# Patient Record
Sex: Female | Born: 1960 | Race: Black or African American | Hispanic: No | Marital: Married | Smoking: Never smoker
Health system: Southern US, Community
[De-identification: ages and names within clinical notes are randomized; demographics above are authoritative.]

## PROBLEM LIST (undated history)

## (undated) ENCOUNTER — Emergency Department (HOSPITAL_BASED_OUTPATIENT_CLINIC_OR_DEPARTMENT_OTHER): Payer: 59

## (undated) DIAGNOSIS — K760 Fatty (change of) liver, not elsewhere classified: Secondary | ICD-10-CM

## (undated) DIAGNOSIS — T8859XA Other complications of anesthesia, initial encounter: Secondary | ICD-10-CM

## (undated) DIAGNOSIS — N63 Unspecified lump in unspecified breast: Secondary | ICD-10-CM

## (undated) DIAGNOSIS — I10 Essential (primary) hypertension: Secondary | ICD-10-CM

## (undated) DIAGNOSIS — T4145XA Adverse effect of unspecified anesthetic, initial encounter: Secondary | ICD-10-CM

## (undated) DIAGNOSIS — K219 Gastro-esophageal reflux disease without esophagitis: Secondary | ICD-10-CM

## (undated) DIAGNOSIS — N6452 Nipple discharge: Secondary | ICD-10-CM

## (undated) DIAGNOSIS — C50919 Malignant neoplasm of unspecified site of unspecified female breast: Secondary | ICD-10-CM

## (undated) DIAGNOSIS — R112 Nausea with vomiting, unspecified: Secondary | ICD-10-CM

## (undated) DIAGNOSIS — M1990 Unspecified osteoarthritis, unspecified site: Secondary | ICD-10-CM

## (undated) DIAGNOSIS — Z9889 Other specified postprocedural states: Secondary | ICD-10-CM

## (undated) DIAGNOSIS — Z923 Personal history of irradiation: Secondary | ICD-10-CM

## (undated) DIAGNOSIS — E7800 Pure hypercholesterolemia, unspecified: Secondary | ICD-10-CM

## (undated) DIAGNOSIS — E119 Type 2 diabetes mellitus without complications: Secondary | ICD-10-CM

## (undated) DIAGNOSIS — Z87442 Personal history of urinary calculi: Secondary | ICD-10-CM

## (undated) HISTORY — PX: TUBAL LIGATION: SHX77

## (undated) HISTORY — PX: ABDOMINAL HYSTERECTOMY: SHX81

## (undated) HISTORY — DX: Gastro-esophageal reflux disease without esophagitis: K21.9

## (undated) HISTORY — DX: Type 2 diabetes mellitus without complications: E11.9

---

## 2000-06-21 ENCOUNTER — Other Ambulatory Visit: Admission: RE | Admit: 2000-06-21 | Discharge: 2000-06-21 | Payer: Self-pay | Admitting: Obstetrics and Gynecology

## 2001-06-27 ENCOUNTER — Encounter (INDEPENDENT_AMBULATORY_CARE_PROVIDER_SITE_OTHER): Payer: Self-pay | Admitting: *Deleted

## 2001-06-27 ENCOUNTER — Ambulatory Visit (HOSPITAL_COMMUNITY): Admission: RE | Admit: 2001-06-27 | Discharge: 2001-06-27 | Payer: Self-pay | Admitting: Gastroenterology

## 2005-06-17 ENCOUNTER — Ambulatory Visit (HOSPITAL_COMMUNITY): Admission: RE | Admit: 2005-06-17 | Discharge: 2005-06-17 | Payer: Self-pay | Admitting: Obstetrics and Gynecology

## 2006-08-09 ENCOUNTER — Ambulatory Visit (HOSPITAL_COMMUNITY): Admission: RE | Admit: 2006-08-09 | Discharge: 2006-08-09 | Payer: Self-pay | Admitting: Obstetrics and Gynecology

## 2008-01-23 ENCOUNTER — Other Ambulatory Visit: Admission: RE | Admit: 2008-01-23 | Discharge: 2008-01-23 | Payer: Self-pay | Admitting: Obstetrics and Gynecology

## 2008-02-10 HISTORY — PX: PARTIAL HYSTERECTOMY: SHX80

## 2008-04-24 ENCOUNTER — Ambulatory Visit (HOSPITAL_COMMUNITY): Admission: RE | Admit: 2008-04-24 | Discharge: 2008-04-24 | Payer: Self-pay | Admitting: Obstetrics & Gynecology

## 2009-05-30 ENCOUNTER — Ambulatory Visit (HOSPITAL_COMMUNITY): Admission: RE | Admit: 2009-05-30 | Discharge: 2009-05-30 | Payer: Self-pay | Admitting: Obstetrics and Gynecology

## 2010-06-27 NOTE — Procedures (Signed)
[place]  Patient:    Kerri Shah, Kerri Shah Visit Number: [id number] MRN: [id number]          Service Type: END Location: ENDO Attending Physician:  Rich Brave Dictated by:   Florencia Reasons, M.D. Proc. Date: 06/27/01 Admit Date:  06/27/2001 Discharge Date: 06/27/2001                             Procedure Report  PROCEDURE:  Colonoscopy with biopsy.  INDICATION:  Follow-up of small adenomatous polyp removed colonoscopically approximately six years ago.  FINDINGS:  Diminutive sigmoid polyp biopsied.  DESCRIPTION OF PROCEDURE:  The nature, purpose, and risks of the procedure were familiar to the patient from prior examinations, and she provided written consent.  Sedation was fentanyl 75 mcg and Versed 7 mg IV prior to and during the course of the procedure without arrhythmias or desaturation.  The Olympus adjustable-tension adult video colonoscope was used for this procedure and was advanced with mild difficulty due to looping and stiffness, ultimately reaching the cecum as identified by clear visualization of the appendiceal orifice and transillumination of the right lower quadrant.  Pullback was then performed.  The quality of the prep was excellent, and it is felt that all areas were well-seen.  There was a diminutive 2 mm sessile polyp in the sigmoid region removed by several cold biopsies.  There was also a pale, hyperplastic-appearing polyp that seemed to essentially flatten out near the rectosigmoid area, and I did not biopsy it.  No other polyps were seen, and there was no evidence of cancer, colitis, vascular malformations, or diverticulosis.  Retroflexion in the rectum was unremarkable, as was reinspection of the rectosigmoid. Pull-out showed mild to moderate internal hemorrhoids.  The patient tolerated the procedure well, and there were no apparent complications.  IMPRESSION: 1. Diminutive sigmoid polyp. 2.  Prior history of adenomatous polyps.  PLAN:  Await pathology on current polyp with colonoscopic follow-up anticipated five years from now in view of the prior history of a colonic adenoma.Dictated by:   Florencia Reasons, M.D. Attending Physician:  Rich Brave DD:  06/27/01 TD:  06/29/01 Job: [id number]

## 2010-10-30 LAB — COMPREHENSIVE METABOLIC PANEL
AST: 18 U/L
BUN: 9 mg/dL (ref 4–21)
Calcium: 9.3 mg/dL
Chloride, Serum: 104
Creat: 0.78
Sodium, serum: 142
Total Protein: 7.1 g/dL

## 2010-12-01 ENCOUNTER — Ambulatory Visit (INDEPENDENT_AMBULATORY_CARE_PROVIDER_SITE_OTHER): Payer: BC Managed Care – PPO | Admitting: Urgent Care

## 2010-12-01 ENCOUNTER — Encounter: Payer: Self-pay | Admitting: Urgent Care

## 2010-12-01 VITALS — BP 120/75 | HR 86 | Temp 97.7°F | Ht 66.0 in | Wt 186.4 lb

## 2010-12-01 DIAGNOSIS — R1011 Right upper quadrant pain: Secondary | ICD-10-CM

## 2010-12-01 DIAGNOSIS — K760 Fatty (change of) liver, not elsewhere classified: Secondary | ICD-10-CM | POA: Insufficient documentation

## 2010-12-01 DIAGNOSIS — K921 Melena: Secondary | ICD-10-CM | POA: Insufficient documentation

## 2010-12-01 DIAGNOSIS — Z800 Family history of malignant neoplasm of digestive organs: Secondary | ICD-10-CM

## 2010-12-01 DIAGNOSIS — K7689 Other specified diseases of liver: Secondary | ICD-10-CM

## 2010-12-01 NOTE — Patient Instructions (Addendum)
Instructions for fatty liver: Recommend 1-2# weight loss per week until ideal body weight through exercise & diet. Low fat/cholesterol diet. Gradually increase exercise from 15 min daily up to 1 hr per day 5 days/week. Limit alcohol use. Call if severe abdominal pain or bleeding prior to procedures Keep your colonoscopy and EGD as planned.

## 2010-12-01 NOTE — Progress Notes (Signed)
Referring Provider: Dr. Sherryll Burger Primary Care Physician:  Kirstie Peri, MD Primary Gastroenterologist:  Dr. Darrick Penna  Chief Complaint  Patient presents with  . Colonoscopy    HPI:  Kerri Shah is a 50 y.o. female here as a referral from Dr. Sherryll Burger for screening colonoscopy.  Upon further questioning by the triage nurse, she had questions about her family history of colon cancer and had recent abdominal pain. She is asked to be brought into the office for further evaluation prior to her procedure. She describes a nagging pain RUQ x few yrs. Intermittent, last mins-hrs.  Not assoc w/ eating. She has tried Omeprazole 20mg  x 1 week-a little help.  Denies fever, chills, nausea, vomiting.  Appetite ok.  +Fatigue.  BM daily or QOD.  C/o small volume bright red blood w/ BM.  Feels like her hemorrhoids are flare.  Wt stable.    She had an ultrasound on 11/24/2010 that showed hepatomegaly with diffuse fatty infiltration of the liver, sludge and polyps noted within the gallbladder, otherwise normal exam. She also had lab work through Dr. Margaretmary Eddy office.  CMP was normal except for elevated glucose (diabetes).  Hemoglobin 12.1, hematocrit 37.1, platelets 273. White blood cell 3.7 (low). TSH was normal.  She has a vitamin D deficiency.  Past Medical History  Diagnosis Date  . DM (diabetes mellitus)   . GERD (gastroesophageal reflux disease)   . Vitamin D deficiency     Past Surgical History  Procedure Date  . Partial hysterectomy 2010    uterine fibroids    Current Outpatient Prescriptions  Medication Sig Dispense Refill  . Multiple Vitamin (MULTIVITAMIN) capsule Take 1 capsule by mouth daily.          Allergies as of 12/01/2010  . (No Known Allergies)    Family History  Problem Relation Age of Onset  . Colon cancer Mother 37  . Colon cancer Brother 79  . Diabetes Brother     History   Social History  . Marital Status: Married    Spouse Name: N/A    Number of Children: 2  . Years of  Education: N/A   Occupational History  . 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 Smoking status: Never Smoker   . Smokeless tobacco: Not on file  . Alcohol Use: No  . Drug Use: No  . Sexually Active: Not on file   Review of Systems: Gen: She has had some fatigue and malaise. Denies any fever chills sweats  CV: Denies chest pain, angina, palpitations, syncope, orthopnea, PND, peripheral edema, and claudication. Resp: Denies dyspnea at rest, dyspnea with exercise, cough, sputum, wheezing, coughing up blood, and pleurisy. GI: Denies vomiting blood, jaundice, and fecal incontinence. GU : Denies urinary burning, blood in urine, urinary frequency, urinary hesitancy, nocturnal urination, and urinary incontinence. MS: Denies joint pain, limitation of movement, and swelling, stiffness, low back pain, extremity pain. Denies muscle weakness, cramps, atrophy.  Derm: Denies rash, itching, dry skin, hives, moles, warts, or unhealing ulcers.  Psych: Denies depression, anxiety, memory loss, suicidal ideation, hallucinations, paranoia, and confusion. Heme: Denies bruising, bleeding, and enlarged lymph nodes.  Physical Exam: BP 120/75  Pulse 86  Temp(Src) 97.7 F (36.5 C) (Temporal)  Ht 5\' 6"  (1.676 m)  Wt 186 lb 6.4 oz (84.55 kg)  BMI 30.09 kg/m2 General:   Alert,  Well-developed, well-nourished, pleasant and cooperative in NAD Head:  Normocephalic and atraumatic. Eyes:  Sclera clear, no icterus.   Conjunctiva pink. Ears:  Normal auditory acuity. Nose:  No deformity, discharge,  or lesions. Mouth:  No deformity or lesions, oropharynx pink and moist. Neck:  Supple; no masses or thyromegaly. Lungs:  Clear throughout to auscultation.   No wheezes, crackles, or rhonchi. No acute distress. Heart:  Regular rate and rhythm; no murmurs, clicks, rubs,  or gallops. Abdomen:  Soft, nontender and nondistended. No masses, hepatosplenomegaly or hernias noted. Normal bowel sounds, without guarding, and  without rebound.   Rectal:  Deferred until time of colonoscopy.   Msk:  Symmetrical without gross deformities. Normal posture. Pulses:  Normal pulses noted. Extremities:  Without clubbing or edema. Neurologic:  Alert and  oriented x4;  grossly normal neurologically. Skin:  Intact without significant lesions or rashes. Cervical Nodes:  No significant cervical adenopathy. Psych:  Alert and cooperative. Normal mood and affect.

## 2010-12-01 NOTE — Assessment & Plan Note (Signed)
In both mother and brother. Will need five-year high-risk surveillance at minimum.

## 2010-12-01 NOTE — Assessment & Plan Note (Signed)
In the setting of normal LFTs. Instructions for fatty liver: Recommend 1-2# weight loss per week until ideal body weight through exercise & diet. Low fat/cholesterol diet. Gradually increase exercise from 15 min daily up to 1 hr per day 5 days/week. Limit alcohol use.

## 2010-12-01 NOTE — Assessment & Plan Note (Signed)
Etiology unclear. Minimal difference with trial of PPI.  Differentials include peptic ulcer disease, gastritis, poorly-controlled GERD, biliary dyskinesia.  EGD with Dr. Darrick Penna in the future. I have discussed risks & benefits which include, but are not limited to, bleeding, infection, perforation & drug reaction.  The patient agrees with this plan & written consent will be obtained.

## 2010-12-01 NOTE — Assessment & Plan Note (Signed)
Kerri Shah is a 50 y.o. female recent set up colonoscopy. She has had small volume hematochezia which I suspect is due to benign anorectal source, however she does need colonoscopy for further evaluation to look for colorectal ca, polyps, diverticular bleeding, especially given her strong family history of colon cancer.  I have discussed risks & benefits which include, but are not limited to, bleeding, infection, perforation & drug reaction.  The patient agrees with this plan & written consent will be obtained.

## 2010-12-02 NOTE — Progress Notes (Signed)
Cc to PCP 

## 2010-12-11 HISTORY — PX: UPPER GASTROINTESTINAL ENDOSCOPY: SHX188

## 2010-12-22 MED ORDER — SODIUM CHLORIDE 0.45 % IV SOLN
Freq: Once | INTRAVENOUS | Status: AC
  Administered 2010-12-23: 11:00:00 via INTRAVENOUS

## 2010-12-23 ENCOUNTER — Ambulatory Visit (HOSPITAL_COMMUNITY)
Admission: RE | Admit: 2010-12-23 | Discharge: 2010-12-23 | Disposition: A | Discharge status: Home / Self Care | Payer: BC Managed Care – PPO | Source: Ambulatory Visit | Attending: Gastroenterology | Admitting: Gastroenterology

## 2010-12-23 ENCOUNTER — Encounter (HOSPITAL_COMMUNITY): Payer: Self-pay | Admitting: *Deleted

## 2010-12-23 ENCOUNTER — Other Ambulatory Visit: Payer: Self-pay | Admitting: Gastroenterology

## 2010-12-23 ENCOUNTER — Encounter (HOSPITAL_COMMUNITY)
Admission: RE | Disposition: A | Discharge status: Home / Self Care | Payer: Self-pay | Source: Ambulatory Visit | Attending: Gastroenterology

## 2010-12-23 DIAGNOSIS — R1011 Right upper quadrant pain: Secondary | ICD-10-CM

## 2010-12-23 DIAGNOSIS — Z800 Family history of malignant neoplasm of digestive organs: Secondary | ICD-10-CM | POA: Insufficient documentation

## 2010-12-23 DIAGNOSIS — K921 Melena: Secondary | ICD-10-CM

## 2010-12-23 DIAGNOSIS — D129 Benign neoplasm of anus and anal canal: Secondary | ICD-10-CM

## 2010-12-23 DIAGNOSIS — K2990 Gastroduodenitis, unspecified, without bleeding: Secondary | ICD-10-CM | POA: Insufficient documentation

## 2010-12-23 DIAGNOSIS — Z09 Encounter for follow-up examination after completed treatment for conditions other than malignant neoplasm: Secondary | ICD-10-CM | POA: Insufficient documentation

## 2010-12-23 DIAGNOSIS — K2970 Gastritis, unspecified, without bleeding: Secondary | ICD-10-CM | POA: Insufficient documentation

## 2010-12-23 DIAGNOSIS — K648 Other hemorrhoids: Secondary | ICD-10-CM

## 2010-12-23 DIAGNOSIS — K3189 Other diseases of stomach and duodenum: Secondary | ICD-10-CM | POA: Insufficient documentation

## 2010-12-23 DIAGNOSIS — D128 Benign neoplasm of rectum: Secondary | ICD-10-CM

## 2010-12-23 DIAGNOSIS — Z860100 Personal history of colon polyps, unspecified: Secondary | ICD-10-CM | POA: Insufficient documentation

## 2010-12-23 DIAGNOSIS — K625 Hemorrhage of anus and rectum: Secondary | ICD-10-CM

## 2010-12-23 DIAGNOSIS — K760 Fatty (change of) liver, not elsewhere classified: Secondary | ICD-10-CM

## 2010-12-23 DIAGNOSIS — D126 Benign neoplasm of colon, unspecified: Secondary | ICD-10-CM | POA: Insufficient documentation

## 2010-12-23 HISTORY — PX: COLONOSCOPY: SHX174

## 2010-12-23 HISTORY — DX: Fatty (change of) liver, not elsewhere classified: K76.0

## 2010-12-23 HISTORY — DX: Pure hypercholesterolemia, unspecified: E78.00

## 2010-12-23 SURGERY — COLONOSCOPY WITH ESOPHAGOGASTRODUODENOSCOPY (EGD)
Anesthesia: Moderate Sedation

## 2010-12-23 MED ORDER — MEPERIDINE HCL 100 MG/ML IJ SOLN
INTRAMUSCULAR | Status: AC
  Filled 2010-12-23: qty 2

## 2010-12-23 MED ORDER — MIDAZOLAM HCL 5 MG/5ML IJ SOLN
INTRAMUSCULAR | Status: AC
  Filled 2010-12-23: qty 10

## 2010-12-23 MED ORDER — OMEPRAZOLE 20 MG PO CPDR: 20.0000 mg | DELAYED_RELEASE_CAPSULE | Freq: Every day | ORAL | Status: DC

## 2010-12-23 MED ORDER — SIMETHICONE 40 MG/0.6ML PO SUSP
ORAL | Status: DC | PRN
  Administered 2010-12-23: 11:00:00

## 2010-12-23 MED ORDER — MEPERIDINE HCL 100 MG/ML IJ SOLN
INTRAMUSCULAR | Status: DC | PRN
  Administered 2010-12-23: 50 mg via INTRAVENOUS
  Administered 2010-12-23: 25 mg via INTRAVENOUS

## 2010-12-23 MED ORDER — MIDAZOLAM HCL 5 MG/5ML IJ SOLN
INTRAMUSCULAR | Status: DC | PRN
  Administered 2010-12-23 (×2): 2 mg via INTRAVENOUS
  Administered 2010-12-23: 1 mg via INTRAVENOUS

## 2010-12-23 MED ORDER — BUTAMBEN-TETRACAINE-BENZOCAINE 2-2-14 % EX AERO
INHALATION_SPRAY | CUTANEOUS | Status: DC | PRN
  Administered 2010-12-23: 2 via TOPICAL

## 2010-12-23 NOTE — H&P (Signed)
Reason for Visit     Colonoscopy        Vitals - Last Recorded       BP Pulse Temp(Src) Ht Wt BMI    120/75  86  97.7 F (36.5 C) (Temporal)  5\' 6"  (1.676 m)  186 lb 6.4 oz (84.55 kg)  30.09 kg/m2       Progress Notes     Lorenza Burton, NP  12/01/2010  4:33 PM  Signed Referring Provider: Dr. Sherryll Burger Primary Care Physician:  Kirstie Peri, MD Primary Gastroenterologist:  Dr. Darrick Penna    Chief Complaint   Patient presents with   .  Colonoscopy      HPI:  Kerri Shah is a 50 y.o. female here as a referral from Dr. Sherryll Burger for screening colonoscopy.  Upon further questioning by the triage nurse, she had questions about her family history of colon cancer and had recent abdominal pain. She is asked to be brought into the office for further evaluation prior to her procedure. She describes a nagging pain RUQ x few yrs. Intermittent, last mins-hrs.  Not assoc w/ eating. She has tried Omeprazole 20mg  x 1 week-a little help.  Denies fever, chills, nausea, vomiting.  Appetite ok.  +Fatigue.  BM daily or QOD.  C/o small volume bright red blood w/ BM.  Feels like her hemorrhoids are flare.  Wt stable.     She had an ultrasound on 11/24/2010 that showed hepatomegaly with diffuse fatty infiltration of the liver, sludge and polyps noted within the gallbladder, otherwise normal exam. She also had lab work through Dr. Margaretmary Eddy office.  CMP was normal except for elevated glucose (diabetes).  Hemoglobin 12.1, hematocrit 37.1, platelets 273. White blood cell 3.7 (low). TSH was normal.  She has a vitamin D deficiency.    Past Medical History   Diagnosis  Date   .  DM (diabetes mellitus)     .  GERD (gastroesophageal reflux disease)     .  Vitamin D deficiency         Past Surgical History   Procedure  Date   .  Partial hysterectomy  2010       uterine fibroids       Current Outpatient Prescriptions   Medication  Sig  Dispense  Refill   .  Multiple Vitamin (MULTIVITAMIN) capsule  Take 1  capsule by mouth daily.               Allergies as of 12/01/2010   .  (No Known Allergies)       Family History   Problem  Relation  Age of Onset   .  Colon cancer  Mother  41   .  Colon cancer  Brother  67   .  Diabetes  Brother         History       Social History   .  Marital Status:  Married       Spouse Name:  N/A       Number of Children:  2   .  Years of Education:  N/A       Occupational History   .  Nsg Aide         Social History Main Topics   .  Smoking status:  Never Smoker    .  Smokeless tobacco:  Not on file   .  Alcohol Use:  No   .  Drug Use:  No   .  Sexually Active:  Not on file      Review of Systems: Gen: She has had some fatigue and malaise. Denies any fever chills sweats   CV: Denies chest pain, angina, palpitations, syncope, orthopnea, PND, peripheral edema, and claudication. Resp: Denies dyspnea at rest, dyspnea with exercise, cough, sputum, wheezing, coughing up blood, and pleurisy. GI: Denies vomiting blood, jaundice, and fecal incontinence. GU : Denies urinary burning, blood in urine, urinary frequency, urinary hesitancy, nocturnal urination, and urinary incontinence. MS: Denies joint pain, limitation of movement, and swelling, stiffness, low back pain, extremity pain. Denies muscle weakness, cramps, atrophy.   Derm: Denies rash, itching, dry skin, hives, moles, warts, or unhealing ulcers.   Psych: Denies depression, anxiety, memory loss, suicidal ideation, hallucinations, paranoia, and confusion. Heme: Denies bruising, bleeding, and enlarged lymph nodes.   Physical Exam: BP 120/75  Pulse 86  Temp(Src) 97.7 F (36.5 C) (Temporal)  Ht 5\' 6"  (1.676 m)  Wt 186 lb 6.4 oz (84.55 kg)  BMI 30.09 kg/m2 General:   Alert,  Well-developed, well-nourished, pleasant and cooperative in NAD Head:  Normocephalic and atraumatic. Eyes:  Sclera clear, no icterus.   Conjunctiva pink. Ears:  Normal auditory acuity. Nose:  No deformity, discharge,   or lesions. Mouth:  No deformity or lesions, oropharynx pink and moist. Neck:  Supple; no masses or thyromegaly. Lungs:  Clear throughout to auscultation.   No wheezes, crackles, or rhonchi. No acute distress. Heart:  Regular rate and rhythm; no murmurs, clicks, rubs,  or gallops. Abdomen:  Soft, nontender and nondistended. No masses, hepatosplenomegaly or hernias noted. Normal bowel sounds, without guarding, and without rebound.    Rectal:  Deferred until time of colonoscopy.    Msk:  Symmetrical without gross deformities. Normal posture. Pulses:  Normal pulses noted. Extremities:  Without clubbing or edema. Neurologic:  Alert and  oriented x4;  grossly normal neurologically. Skin:  Intact without significant lesions or rashes. Cervical Nodes:  No significant cervical adenopathy. Psych:  Alert and cooperative. Normal mood and affect.     Gl[place]ora Score  12/02/2010  1:08 PM  Signed Cc to PCP     Hematochezia - Lorenza Burton, NP  12/01/2010  4:30 PM  Signed Kerri Shah is a 50 y.o. female recent set up colonoscopy. She has had small volume hematochezia which I suspect is due to benign anorectal source, however she does need colonoscopy for further evaluation to look for colorectal ca, polyps, diverticular bleeding, especially given her strong family history of colon cancer.  I have discussed risks & benefits which include, but are not limited to, bleeding, infection, perforation & drug reaction.  The patient agrees with this plan & written consent will be obtained.       Family history of colon cancer Lorenza Burton, NP  12/01/2010  4:30 PM  Signed In both mother and brother. Will need five-year high-risk surveillance at minimum.  Fatty liver Lorenza Burton, NP  12/01/2010  4:31 PM  Signed In the setting of normal LFTs. Instructions for fatty liver: Recommend 1-2# weight loss per week until ideal body weight through exercise & diet. Low fat/cholesterol diet. Gradually  increase exercise from 15 min daily up to 1 hr per day 5 days/week. Limit alcohol use.       RUQ pain Lorenza Burton, NP  12/01/2010  4:32 PM  Signed Etiology unclear. Minimal difference with trial of PPI.  Differentials include peptic ulcer disease, gastritis, poorly-controlled GERD, biliary dyskinesia.  EGD with  Dr. Darrick Penna in the future. I have discussed risks & benefits which include, but are not limited to, bleeding, infection, perforation & drug reaction.  The patient agrees with this plan & written consent will be obtained.

## 2010-12-23 NOTE — Interval H&P Note (Signed)
History and Physical Interval Note:   12/23/2010   10:36 AM   Kerri Shah  has presented today for surgery, with the diagnosis of FH OF CRC- RUQ PAIN, FATTY LIVER, HEMATOCHEZIA  The various methods of treatment have been discussed with the patient and family. After consideration of risks, benefits and other options for treatment, the patient has consented to  Procedure(s): COLONOSCOPY WITH ESOPHAGOGASTRODUODENOSCOPY (EGD) as a surgical intervention .  The patients' history has been reviewed, patient examined, no change in status, stable for surgery.  I have reviewed the patients' chart and labs.  Questions were answered to the patient's satisfaction.     [name]  MD  THE PATIENT WAS EXAMINED AND THERE IS NO CHANGE IN THE PATIENT'S CONDITION SINCE THE ORIGINAL H&P WAS COMPLETED.

## 2010-12-31 ENCOUNTER — Telehealth: Payer: Self-pay | Admitting: Gastroenterology

## 2010-12-31 NOTE — Telephone Encounter (Signed)
Please call pt. HE had A HYPERPLASTIC POLYP REMOVED removed from HIScolon. His stomach Bx shows mild gastritis. Colonoscopy in 5 years.  Follow a high fiber diet. CONTINUE YOUR WEIGHT LOSS EFFORTS.  CONTINUE OMEPRAZOLE EVERY MORNING.  STOP USING NSAIDS FOR 2 WEEKS. USE TYLENOL AS NEEDED FOR PAIN.  FOLLOW UP IN 3 MOS. Wed Mar 25 2011 @ 10:30

## 2011-01-06 ENCOUNTER — Telehealth: Payer: Self-pay

## 2011-01-06 NOTE — Telephone Encounter (Signed)
Pt called for results of her colonoscopy. Informed (see phone note of 12/31/2010). She said to let Dr. Darrick Penna know that she is still having a little nagging pain under her ribs on her right side. It is usually intermittent, but sometimes it will be constant for about all day. Please advise!

## 2011-01-06 NOTE — Telephone Encounter (Signed)
LMOM to call.

## 2011-01-07 NOTE — Telephone Encounter (Signed)
Pt informed

## 2011-01-07 NOTE — Telephone Encounter (Signed)
Informed pt .

## 2011-01-07 NOTE — Telephone Encounter (Signed)
Please call pt. sHE SHOULD MONITOR HER PAIN AND TRY TO IDENTIFY A TRIGGER. CONTINUE OMEPRAZOLE EVERY MORNING.  STOP USING NSAIDS FOR 2 WEEKS. USE TYLENOL AS NEEDED FOR PAIN.

## 2011-01-13 NOTE — Telephone Encounter (Signed)
Results Cc to PCP  

## 2011-03-24 ENCOUNTER — Encounter: Payer: Self-pay | Admitting: Gastroenterology

## 2011-03-25 ENCOUNTER — Encounter: Payer: Self-pay | Admitting: Gastroenterology

## 2011-03-25 ENCOUNTER — Ambulatory Visit (INDEPENDENT_AMBULATORY_CARE_PROVIDER_SITE_OTHER): Payer: BC Managed Care – PPO | Admitting: Gastroenterology

## 2011-03-25 VITALS — BP 147/76 | HR 99 | Temp 97.6°F | Ht 66.0 in | Wt 183.6 lb

## 2011-03-25 DIAGNOSIS — Z800 Family history of malignant neoplasm of digestive organs: Secondary | ICD-10-CM

## 2011-03-25 DIAGNOSIS — R1011 Right upper quadrant pain: Secondary | ICD-10-CM

## 2011-03-25 DIAGNOSIS — K7689 Other specified diseases of liver: Secondary | ICD-10-CM

## 2011-03-25 DIAGNOSIS — K760 Fatty (change of) liver, not elsewhere classified: Secondary | ICD-10-CM

## 2011-03-25 NOTE — Progress Notes (Signed)
Referring Provider: Kirstie Peri, MD Primary Care Physician:  Kirstie Peri, MD, MD Primary Gastroenterologist: Dr. Darrick Penna  Chief Complaint  Patient presents with  . Follow-up    HPI:   Kerri Shah returns today in follow-up after EGD and TCS, as well as RUQ pain. EGD with gastritis noted, forceps dilation of patent ring. TCS with hyperplastic polyp, repeat in 5 years due to [facility] of colon cancer. (Nov 2017). Taking Prilosec daily, notes nausea if forgets it. Pain has improved significantly since taking Prilosec. Reports intermittent RLQ almost groin discomfort, sends shooting pains down right medial thigh. Needs to see Dr. Donzetta Matters this year for f/u, (surgeon who performed hysterectomy in past). Overall doing well.   11/24/2010 that showed hepatomegaly with diffuse fatty infiltration of the liver, sludge and polyps noted within the gallbladder, otherwise normal exam   Past Medical History  Diagnosis Date  . GERD (gastroesophageal reflux disease)   . Vitamin d deficiency   . Hypercholesteremia   . DM (diabetes mellitus)     not on any medications  . Fatty liver     Past Surgical History  Procedure Date  . Partial hysterectomy 2010    uterine fibroids  . Colonoscopy 12/23/10    internal hemorrhoids/polyps in the recto-sigmoid colon, hyperplastic, TCS IN 5 years  . Upper gastrointestinal endoscopy Nov 2012    mild gastritis, patent esophageal ring s/p forceps dilation, chronic gastritis on path    Current Outpatient Prescriptions  Medication Sig Dispense Refill  . Multiple Vitamin (MULTIVITAMIN) capsule Take 1 capsule by mouth daily.        Marland Kitchen omeprazole (PRILOSEC) 20 MG capsule Take 1 capsule (20 mg total) by mouth daily.  31 capsule  11    Allergies as of 03/25/2011  . (No Known Allergies)    Family History  Problem Relation Age of Onset  . Colon cancer Mother 82  . Colon cancer Brother 56  . Diabetes Brother     History   Social History  . Marital Status: Married      Spouse Name: N/A    Number of Children: 2  . Years of Education: N/A   Occupational History  . 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 Smoking status: Never Smoker   . Smokeless tobacco: None  . Alcohol Use: No  . Drug Use: No  . Sexually Active: None   Other Topics Concern  . None   Social History Narrative  . None    Review of Systems: Gen: Denies fever, chills, anorexia. Denies fatigue, weakness, weight loss.  CV: Denies chest pain, palpitations, syncope, peripheral edema, and claudication. Resp: Denies dyspnea at rest, cough, wheezing, coughing up blood, and pleurisy. GI: Denies vomiting blood, jaundice, and fecal incontinence.   Denies dysphagia or odynophagia. Derm: Denies rash, itching, dry skin Psych: Denies depression, anxiety, memory loss, confusion. No homicidal or suicidal ideation.  Heme: Denies bruising, bleeding, and enlarged lymph nodes.  Physical Exam: BP 147/76  Pulse 99  Temp(Src) 97.6 F (36.4 C) (Temporal)  Ht 5\' 6"  (1.676 m)  Wt 183 lb 9.6 oz (83.28 kg)  BMI 29.63 kg/m2 General:   Alert and oriented. No distress noted. Pleasant and cooperative.  Head:  Normocephalic and atraumatic. Eyes:  Conjuctiva clear without scleral icterus. Mouth:  Oral mucosa pink and moist. Good dentition. No lesions. Neck:  Supple, without mass or thyromegaly. Heart:  S1, S2 present without murmurs, rubs, or gallops. Regular rate and rhythm. Abdomen:  +BS, soft, non-tender  and non-distended. No rebound or guarding. No HSM or masses noted. Msk:  Symmetrical without gross deformities. Normal posture. Extremities:  Without edema. Neurologic:  Alert and  oriented x4;  grossly normal neurologically. Skin:  Intact without significant lesions or rashes. Cervical Nodes:  No significant cervical adenopathy. Psych:  Alert and cooperative. Normal mood and affect.

## 2011-03-25 NOTE — Patient Instructions (Signed)
We will see you back in 6 months. Please call us if you notice any worsening of the right-sided pain.   We would like to repeat your liver tests in a few months. I have included a letter for Dr. Sherryll Burger so that this may be included in labs drawn at your routine visit.   Please follow-up with your gynecologist soon.   Contact us with any questions in the meantime, and continue to take Prilosec daily!

## 2011-03-29 ENCOUNTER — Encounter: Payer: Self-pay | Admitting: Gastroenterology

## 2011-03-29 NOTE — Assessment & Plan Note (Signed)
Repeat labs with Dr. Sherryll Burger in a few months, labs provided. Return in 6 mos. Continue low-fat diet, wt loss.

## 2011-03-29 NOTE — Assessment & Plan Note (Addendum)
Resolved with Prilosec. Continue Prilosec daily, f/u in 6 mos. Repeat LFTs with Dr. Sherryll Burger when seen in the next few months, order and letter provided. Contact office if recurrence of RUQ pain and will proceed with HIDA.   As of note, does report RLQ/almost groin discomfort that causes shooting pains down right medial thigh. (ovary remains). Will be seeing Dr. Donzetta Matters soon for f/u due to past hysterectomy. Contact our office if continued issues.

## 2011-03-29 NOTE — Assessment & Plan Note (Signed)
Nov 2012 hyperplastic polyp. Repeat in Nov 2017. No rectal bleeding noted.

## 2011-03-30 NOTE — Progress Notes (Signed)
Faxed to PCP

## 2011-04-15 NOTE — Progress Notes (Signed)
REVIEWED.  

## 2011-09-09 ENCOUNTER — Encounter: Payer: Self-pay | Admitting: Gastroenterology

## 2012-08-29 ENCOUNTER — Other Ambulatory Visit: Payer: Self-pay | Admitting: Gastroenterology

## 2013-06-29 ENCOUNTER — Other Ambulatory Visit (HOSPITAL_COMMUNITY): Payer: Self-pay | Admitting: Internal Medicine

## 2013-06-29 DIAGNOSIS — Z1231 Encounter for screening mammogram for malignant neoplasm of breast: Secondary | ICD-10-CM

## 2013-07-04 ENCOUNTER — Ambulatory Visit (HOSPITAL_COMMUNITY)
Admission: RE | Admit: 2013-07-04 | Discharge: 2013-07-04 | Disposition: A | Discharge status: Home / Self Care | Payer: BC Managed Care – PPO | Source: Ambulatory Visit | Attending: Internal Medicine | Admitting: Internal Medicine

## 2013-07-04 DIAGNOSIS — Z1231 Encounter for screening mammogram for malignant neoplasm of breast: Secondary | ICD-10-CM | POA: Insufficient documentation

## 2013-07-04 DIAGNOSIS — R928 Other abnormal and inconclusive findings on diagnostic imaging of breast: Secondary | ICD-10-CM | POA: Insufficient documentation

## 2013-07-06 ENCOUNTER — Other Ambulatory Visit: Payer: Self-pay | Admitting: Internal Medicine

## 2013-07-06 DIAGNOSIS — R928 Other abnormal and inconclusive findings on diagnostic imaging of breast: Secondary | ICD-10-CM

## 2013-07-12 ENCOUNTER — Ambulatory Visit (HOSPITAL_COMMUNITY)
Admission: RE | Admit: 2013-07-12 | Discharge: 2013-07-12 | Disposition: A | Discharge status: Home / Self Care | Payer: BC Managed Care – PPO | Source: Ambulatory Visit | Attending: Internal Medicine | Admitting: Internal Medicine

## 2013-07-12 DIAGNOSIS — R928 Other abnormal and inconclusive findings on diagnostic imaging of breast: Secondary | ICD-10-CM | POA: Insufficient documentation

## 2014-08-10 ENCOUNTER — Other Ambulatory Visit (HOSPITAL_COMMUNITY): Payer: Self-pay | Admitting: Internal Medicine

## 2014-08-10 DIAGNOSIS — Z1231 Encounter for screening mammogram for malignant neoplasm of breast: Secondary | ICD-10-CM

## 2014-08-15 ENCOUNTER — Ambulatory Visit (HOSPITAL_COMMUNITY)
Admission: RE | Admit: 2014-08-15 | Discharge: 2014-08-15 | Disposition: A | Discharge status: Home / Self Care | Payer: 59 | Source: Ambulatory Visit | Attending: Internal Medicine | Admitting: Internal Medicine

## 2014-08-15 DIAGNOSIS — Z1231 Encounter for screening mammogram for malignant neoplasm of breast: Secondary | ICD-10-CM | POA: Diagnosis present

## 2015-02-10 DIAGNOSIS — C50919 Malignant neoplasm of unspecified site of unspecified female breast: Secondary | ICD-10-CM

## 2015-02-10 HISTORY — DX: Malignant neoplasm of unspecified site of unspecified female breast: C50.919

## 2015-11-22 ENCOUNTER — Other Ambulatory Visit (HOSPITAL_COMMUNITY): Payer: Self-pay | Admitting: Internal Medicine

## 2015-11-22 DIAGNOSIS — Z1231 Encounter for screening mammogram for malignant neoplasm of breast: Secondary | ICD-10-CM

## 2015-12-05 ENCOUNTER — Ambulatory Visit (HOSPITAL_COMMUNITY)
Admission: RE | Admit: 2015-12-05 | Discharge: 2015-12-05 | Disposition: A | Discharge status: Home / Self Care | Payer: BLUE CROSS/BLUE SHIELD | Source: Ambulatory Visit | Attending: Internal Medicine | Admitting: Internal Medicine

## 2015-12-05 DIAGNOSIS — Z1231 Encounter for screening mammogram for malignant neoplasm of breast: Secondary | ICD-10-CM | POA: Insufficient documentation

## 2015-12-09 ENCOUNTER — Other Ambulatory Visit: Payer: Self-pay | Admitting: Internal Medicine

## 2015-12-09 DIAGNOSIS — R928 Other abnormal and inconclusive findings on diagnostic imaging of breast: Secondary | ICD-10-CM

## 2015-12-16 ENCOUNTER — Encounter: Payer: Self-pay | Admitting: Gastroenterology

## 2015-12-24 ENCOUNTER — Ambulatory Visit (HOSPITAL_COMMUNITY)
Admission: RE | Admit: 2015-12-24 | Discharge: 2015-12-24 | Disposition: A | Discharge status: Home / Self Care | Payer: BLUE CROSS/BLUE SHIELD | Source: Ambulatory Visit | Attending: Internal Medicine | Admitting: Internal Medicine

## 2015-12-24 ENCOUNTER — Other Ambulatory Visit: Payer: Self-pay | Admitting: Internal Medicine

## 2015-12-24 DIAGNOSIS — R928 Other abnormal and inconclusive findings on diagnostic imaging of breast: Secondary | ICD-10-CM | POA: Diagnosis present

## 2015-12-24 DIAGNOSIS — R921 Mammographic calcification found on diagnostic imaging of breast: Secondary | ICD-10-CM

## 2015-12-31 ENCOUNTER — Ambulatory Visit
Admission: RE | Admit: 2015-12-31 | Discharge: 2015-12-31 | Disposition: A | Discharge status: Home / Self Care | Payer: BLUE CROSS/BLUE SHIELD | Source: Ambulatory Visit | Attending: Internal Medicine | Admitting: Internal Medicine

## 2015-12-31 DIAGNOSIS — R921 Mammographic calcification found on diagnostic imaging of breast: Secondary | ICD-10-CM

## 2016-01-13 ENCOUNTER — Other Ambulatory Visit: Payer: Self-pay | Admitting: General Surgery

## 2016-01-13 DIAGNOSIS — Z170 Estrogen receptor positive status [ER+]: Principal | ICD-10-CM

## 2016-01-13 DIAGNOSIS — C50412 Malignant neoplasm of upper-outer quadrant of left female breast: Secondary | ICD-10-CM

## 2016-01-15 ENCOUNTER — Encounter: Payer: Self-pay | Admitting: Genetic Counselor

## 2016-01-16 ENCOUNTER — Encounter: Payer: Self-pay | Admitting: Radiation Oncology

## 2016-01-16 ENCOUNTER — Telehealth: Payer: Self-pay | Admitting: Hematology and Oncology

## 2016-01-16 NOTE — Telephone Encounter (Signed)
Appt scheduled w/Gudena on 12/12 @345pm . Pt agreed to appt date and time.

## 2016-01-17 NOTE — Progress Notes (Signed)
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  Receptor Status: ER(90%), PR (50%), Her2-neu (), Ki-()  Did patient present with symptoms or was this found on screening mammography?: It was found on a screening mammogram.   Past/Anticipated interventions by surgeon, if any: Dr. Barry Dienes. It has not been scheduled yet. Ms. Ackerley believes it might be 01/28/16. Dr. Chrisandra Carota nurse was going to try for this date.   Past/Anticipated interventions by medical oncology, if any: Dr. Lindi Adie today at 3:45.  Lymphedema issues, if any:  N/A  Pain issues, if any:  She denies  SAFETY ISSUES:  Prior radiation? No  Pacemaker/ICD? No  Possible current pregnancy? No       Is the patient on methotrexate? No  Current Complaints / other details:    BP (!) 166/84   Pulse (!) 102   Temp 98.4 F (36.9 C)   Ht 5' 6" (1.676 m)   Wt 180 lb 9.6 oz (81.9 kg)   SpO2 100% Comment: room air  BMI 29.15 kg/m     Arty Lantzy, Stephani Police, RN   01/17/2016,8:09 AM

## 2016-01-20 ENCOUNTER — Encounter: Payer: Self-pay | Admitting: Hematology and Oncology

## 2016-01-21 ENCOUNTER — Ambulatory Visit (HOSPITAL_BASED_OUTPATIENT_CLINIC_OR_DEPARTMENT_OTHER): Payer: BLUE CROSS/BLUE SHIELD | Admitting: Hematology and Oncology

## 2016-01-21 ENCOUNTER — Ambulatory Visit
Admission: RE | Admit: 2016-01-21 | Discharge: 2016-01-21 | Disposition: A | Discharge status: Home / Self Care | Payer: BLUE CROSS/BLUE SHIELD | Source: Ambulatory Visit | Attending: Radiation Oncology | Admitting: Radiation Oncology

## 2016-01-21 ENCOUNTER — Encounter: Payer: Self-pay | Admitting: Radiation Oncology

## 2016-01-21 ENCOUNTER — Encounter: Payer: Self-pay | Admitting: *Deleted

## 2016-01-21 ENCOUNTER — Encounter: Payer: Self-pay | Admitting: Hematology and Oncology

## 2016-01-21 DIAGNOSIS — E559 Vitamin D deficiency, unspecified: Secondary | ICD-10-CM | POA: Diagnosis not present

## 2016-01-21 DIAGNOSIS — K219 Gastro-esophageal reflux disease without esophagitis: Secondary | ICD-10-CM | POA: Insufficient documentation

## 2016-01-21 DIAGNOSIS — C50412 Malignant neoplasm of upper-outer quadrant of left female breast: Secondary | ICD-10-CM | POA: Diagnosis present

## 2016-01-21 DIAGNOSIS — Z833 Family history of diabetes mellitus: Secondary | ICD-10-CM | POA: Insufficient documentation

## 2016-01-21 DIAGNOSIS — Z170 Estrogen receptor positive status [ER+]: Principal | ICD-10-CM

## 2016-01-21 DIAGNOSIS — E119 Type 2 diabetes mellitus without complications: Secondary | ICD-10-CM | POA: Insufficient documentation

## 2016-01-21 DIAGNOSIS — E7800 Pure hypercholesterolemia, unspecified: Secondary | ICD-10-CM | POA: Insufficient documentation

## 2016-01-21 DIAGNOSIS — Z79899 Other long term (current) drug therapy: Secondary | ICD-10-CM | POA: Insufficient documentation

## 2016-01-21 DIAGNOSIS — D0512 Intraductal carcinoma in situ of left breast: Secondary | ICD-10-CM | POA: Insufficient documentation

## 2016-01-21 DIAGNOSIS — M1990 Unspecified osteoarthritis, unspecified site: Secondary | ICD-10-CM | POA: Diagnosis not present

## 2016-01-21 DIAGNOSIS — K760 Fatty (change of) liver, not elsewhere classified: Secondary | ICD-10-CM | POA: Diagnosis not present

## 2016-01-21 DIAGNOSIS — Z7984 Long term (current) use of oral hypoglycemic drugs: Secondary | ICD-10-CM | POA: Insufficient documentation

## 2016-01-21 DIAGNOSIS — Z800 Family history of malignant neoplasm of digestive organs: Secondary | ICD-10-CM

## 2016-01-21 HISTORY — DX: Unspecified osteoarthritis, unspecified site: M19.90

## 2016-01-21 NOTE — Progress Notes (Signed)
Radiation Oncology         ([phone]) [phone] ________________________________  Initial outpatient Consultation  Name: Kerri Shah MRN: [id number]  Date: 01/21/2016  DOB: 10-14-1960  [id number], MD  [name], MD   REFERRING PHYSICIAN: [name], MD  DIAGNOSIS:    ICD-9-CM ICD-10-CM   1. 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 female who presented with an abnormal left breast mammogram earlier this year.  Biopsy on 11-21 showed DCIS calcifications and with characteristics as described above in the diagnosis.  She is here with her husband and granddaughter. She does not work outside the home.  She sees med/onc today.  Anticipates lumpectomy next week with Dr Barry Dienes.  She is overall otherwise in her USOH.  She has some baseline stiffness in her neck, dry skin, and baseline "lumpy" breasts.  She has lost 3 lb recently, related to anxiety.  PREVIOUS RADIATION THERAPY: No  PAST MEDICAL HISTORY:  has a past medical history of DM (diabetes mellitus) ([place]); Fatty liver; GERD (gastroesophageal reflux disease); Hypercholesteremia; Osteoarthritis; and Vitamin D deficiency.    PAST SURGICAL HISTORY: Past Surgical History:  Procedure Laterality Date  . COLONOSCOPY  12/23/10   internal hemorrhoids/polyps in the recto-sigmoid colon, hyperplastic, TCS IN 5 years  . PARTIAL HYSTERECTOMY  2010   uterine fibroids  . UPPER GASTROINTESTINAL ENDOSCOPY  Nov 2012   mild gastritis, patent esophageal ring s/p forceps dilation, chronic gastritis on path    FAMILY HISTORY: family history includes Colon cancer (age of onset: 28) in her brother; Colon cancer (age of onset: 76) in her mother;  Diabetes in her brother.  SOCIAL HISTORY:  reports that she has never smoked. She has never used smokeless tobacco. She reports that she does not drink alcohol or use drugs.  ALLERGIES: Patient has no known allergies.  MEDICATIONS:  Current Outpatient Prescriptions  Medication Sig Dispense Refill  . Multiple Vitamin (MULTIVITAMIN) capsule Take 1 capsule by mouth daily.      Marland Kitchen omeprazole (PRILOSEC) 20 MG capsule TAKE 1 CAPSULE DAILY. 30 capsule 5  . metFORMIN (GLUCOPHAGE-XR) 500 MG 24 hr tablet Take 1,000 mg by mouth daily.  1  . pravastatin (PRAVACHOL) 40 MG tablet Take 40 mg by mouth daily.  3   No current facility-administered medications for this encounter.     REVIEW OF SYSTEMS: A 10+ POINT REVIEW OF SYSTEMS WAS OBTAINED including neurology, dermatology, psychiatry, cardiac, respiratory, lymph, extremities, GI, Musculoskeletal, constitutional, breasts, reproductive, immunologic.  All pertinent positives are noted in the HPI.  All others are negative.   PHYSICAL EXAM:  height is 5\' 6"  (1.676 m) and weight is 180 lb 9.6 oz (81.9 kg). Her temperature is 98.4 F (36.9 C). Her blood pressure is 166/84 (abnormal) and her pulse is 102 (abnormal). Her oxygen saturation is 100%.   General: Alert and oriented, in no acute distress HEENT: Head is normocephalic. Extraocular movements are intact. Oropharynx is clear. Neck: Neck is supple, no palpable cervical or supraclavicular lymphadenopathy. Heart: Regular in rate and rhythm with no murmurs, rubs, or gallops. Chest: Clear to auscultation bilaterally, with no rhonchi, wheezes, or rales. Abdomen: Soft, nontender, nondistended, with no rigidity or guarding. Extremities: No cyanosis or  edema. Lymphatics: see Neck Exam Skin: No concerning lesions. Musculoskeletal: symmetric strength and muscle tone throughout. Neurologic: Cranial nerves II through XII are grossly intact. No obvious focalities. Speech is fluent. Coordination is  intact. Psychiatric: Judgment and insight are intact. Affect is appropriate. Breasts: large breasts. no palpable masses appreciated in the breasts or axillae bilaterally .   ECOG = 0  0 - Asymptomatic (Fully active, able to carry on all predisease activities without restriction)  1 - Symptomatic but completely ambulatory (Restricted in physically strenuous activity but ambulatory and able to carry out work of a light or sedentary nature. For example, light housework, office work)  2 - Symptomatic, <50% in bed during the day (Ambulatory and capable of all self care but unable to carry out any work activities. Up and about more than 50% of waking hours)  3 - Symptomatic, >50% in bed, but not bedbound (Capable of only limited self-care, confined to bed or chair 50% or more of waking hours)  4 - Bedbound (Completely disabled. Cannot carry on any self-care. Totally confined to bed or chair)  5 - Death   Eustace Pen MM, Creech RH, Tormey DC, et al. [phone]). "Toxicity and response criteria of the [facility]". [place] Oncol. 5 (6): 649-55   LABORATORY DATA:  No results found for: WBC, HGB, HCT, MCV, PLT CMP     Component Value Date/Time   BUN 9 10/30/2010 1638   CREATININE 0.78 10/30/2010 1638   CALCIUM 9.3 10/30/2010 1638   PROT 7.1 10/30/2010 1638   ALBUMIN 4.6 10/30/2010 1638   AST 18 10/30/2010 1638   ALT 27 10/30/2010 1638   ALKPHOS 59 10/30/2010 1638   BILITOT 0.4 10/30/2010 1638         RADIOGRAPHY: Mm Diag Breast Tomo Uni Left  Result Date: 12/24/2015 CLINICAL DATA:  Recall from screening mammography. EXAM: 2D DIGITAL DIAGNOSTIC UNILATERAL LEFT MAMMOGRAM WITH CAD AND ADJUNCT TOMO COMPARISON:  Previous exam(s). ACR Breast Density Category b: There are scattered areas of fibroglandular density. FINDINGS: Additional views of the left breast demonstrate a group of heterogeneous calcifications with linear forms and linear orientation to be present within the  left breast at the 12:30 o'clock position. These span 10 mm and are suspicious for possible DCIS. Tissue sampling via stereotactic biopsy is recommended. Additional views of the area of asymmetry within the lateral left breast demonstrate the appearance to be similar to prior studies consistent with mildly asymmetric stable benign fibroglandular tissue. Mammographic images were processed with CAD. IMPRESSION: 1 cm group of suspicious calcifications located within the left breast at the 12:30 o'clock position. Tissue sampling via stereotactic biopsy is recommended. RECOMMENDATION: Left breast stereotactic biopsy. I have discussed the findings and recommendations with the patient. Results were also provided in writing at the conclusion of the visit. If applicable, a reminder letter will be sent to the patient regarding the next appointment. BI-RADS CATEGORY  4: Suspicious. Electronically Signed   By: Altamese Cabal M.D.   On: 12/24/2015 11:18   Mm Clip Placement Left  Result Date: 12/31/2015 CLINICAL DATA:  Status post stereotactic core biopsy of left breast calcifications. EXAM: DIAGNOSTIC LEFT MAMMOGRAM POST STEREOTACTIC BIOPSY COMPARISON:  Previous exam(s). FINDINGS: Mammographic images were obtained following stereotactic guided biopsy of calcifications within the upper-outer quadrant of the left breast. At the conclusion of the procedure, a ribbon shaped tissue site marker was placed at the biopsy site. This biopsy clip is well positioned. IMPRESSION: Postprocedure mammogram for clip placement.  Ribbon shaped biopsy clip well-positioned at the site of the targeted left breast calcifications. Final Assessment: Post Procedure Mammograms for Marker Placement Electronically Signed   By: Franki Cabot M.D.   On: 12/31/2015 10:30   Mm Lt Breast Bx W Loc Dev 1st Lesion Image Bx Spec Stereo Guide  Addendum Date: 01/01/2016   ADDENDUM REPORT: 01/01/2016 13:40 ADDENDUM: Pathology revealed HIGH GRADE DUCTAL  CARCINOMA IN SITU WITH CALCIFICATIONS of the upper outer quadrant of the Left breast. This was found to be concordant by Dr. Franki Cabot. Pathology results were discussed with the patient by telephone. The patient reported doing well after the biopsy with tenderness at the site. Post biopsy instructions and care were reviewed and questions were answered. The patient was encouraged to call [facility] for any additional concerns. Dr. Monico Blitz of [facility] will arrange a surgical referral. Pathology and Imaging reports were faxed to Dr. [name] on January 01, 2016. Pathology results reported by Terie Purser, RN on 01/01/2016. Electronically Signed   By: Franki Cabot M.D.   On: 01/01/2016 13:40   Result Date: 01/01/2016 CLINICAL DATA:  Patient with left breast calcifications presents today for stereotactic biopsy. EXAM: LEFT BREAST STEREOTACTIC CORE NEEDLE BIOPSY COMPARISON:  Previous exams. FINDINGS: The patient and I discussed the procedure of stereotactic-guided biopsy including benefits and alternatives. We discussed the high likelihood of a successful procedure. We discussed the risks of the procedure including infection, bleeding, tissue injury, clip migration, and inadequate sampling. Informed written consent was given. The usual time out protocol was performed immediately prior to the procedure. Using sterile technique and 1% Lidocaine as local anesthetic, under stereotactic guidance, a 9 gauge vacuum assisted device was used to perform core needle biopsy of calcifications in the upper outer quadrant of the left breast using a lateral approach. Specimen radiograph was performed showing calcifications. Specimens with calcifications are identified for pathology. At the conclusion of the procedure, a ribbon shaped tissue marker clip was deployed into the biopsy cavity. Follow-up 2-view mammogram was performed and dictated separately. IMPRESSION: Stereotactic-guided  biopsy of left breast calcifications. No apparent complications. Electronically Signed: By: Franki Cabot M.D. On: 12/31/2015 10:29      IMPRESSION/PLAN: Left Breast DCIS, ER+ and high grade.  She lives in [place] - we discussed the option of receiving radiotherapy there. She leans toward pursuing treatment here, however.   She would be a good candidate for breast conservation. I talked to her about the option of a mastectomy and informed her that her expected overall survival would be equivalent between mastectomy and breast conservation, based upon randomized controlled data. She is enthusiastic about breast conservation.  It was a pleasure meeting the patient today. We discussed the risks, benefits, and side effects of radiotherapy. I recommend radiotherapy to the left breast to reduce her risk of locoregional recurrence by 1/2.  We discussed that radiation would take approximately 6 weeks to complete and that I would give the patient a few weeks to heal following surgery before starting treatment planning. We spoke about acute effects including skin irritation and fatigue as well as much less common late effects including internal organ injury or irritation. We spoke about the latest technology that is used to minimize the risk of late effects for patients undergoing radiotherapy to the breast or chest wall. No guarantees of treatment were given. The patient is enthusiastic about proceeding with treatment. I look forward to participating in the patient's care.  I  will await her referral back to me for postoperative follow-up and eventual CT simulation/treatment planning.  If she opts for treatment in [place], we can see her for follow-up there instead.  __________________________________________   Eppie Gibson, MD

## 2016-01-21 NOTE — Progress Notes (Signed)
[name], [name], [name] 55 y.o. female is here because of recent diagnosis of left breast DCIS. She presented for screening mammogram detected calcifications. These were on 12:30 position 1 cm in size. Stereotactic core biopsy was performed which revealed high-grade DCIS that was ER/PR positive. She was seen by Dr. Barry Dienes who is planning to do a lumpectomy. She is here to discuss adjuvant treatment options. She is accompanied by her husband as well as her granddaughter.  I reviewed her records extensively and collaborated the history with the patient.  SUMMARY OF ONCOLOGIC HISTORY:   Ductal carcinoma in situ (DCIS) of left breast   12/31/2015 Initial Diagnosis    Left breast [name] biopsy: DCIS with calcifications ER 90%, PR -50%, high-grade      MEDICAL HISTORY:  Past Medical History:  Diagnosis Date  . DM (diabetes mellitus) ([place])    not on any medications  . Fatty liver   . GERD (gastroesophageal reflux disease)   . Hypercholesteremia   . Osteoarthritis    bilateral knees and Left shoulder  . Vitamin D deficiency     SURGICAL HISTORY: Past Surgical History:  Procedure Laterality Date  . COLONOSCOPY  12/23/10   internal hemorrhoids/polyps in the recto-sigmoid colon, hyperplastic, TCS IN 5 years  . PARTIAL HYSTERECTOMY  2010   uterine fibroids  . UPPER GASTROINTESTINAL ENDOSCOPY  Nov 2012   mild gastritis, patent esophageal ring s/p forceps dilation, chronic gastritis on path    SOCIAL HISTORY: Social History   Social History  . Marital status: Married    Spouse name: N/A  . Number of children: 2  . Years of education: N/A   Occupational History  . 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 Smoking status: Never  Smoker  . Smokeless tobacco: Never Used  . Alcohol use No  . Drug use: No  . Sexual activity: Not on file   Other Topics Concern  . Not on file   Social History Narrative  . No narrative on file    FAMILY HISTORY: Family History  Problem Relation Age of Onset  . Colon cancer Mother 19  . Colon cancer Brother 44  . Diabetes Brother     ALLERGIES:  has No Known Allergies.  MEDICATIONS:  Current Outpatient Prescriptions  Medication Sig Dispense Refill  . metFORMIN (GLUCOPHAGE-XR) 500 MG 24 hr tablet Take 1,000 mg by mouth daily.  1  . Multiple Vitamin (MULTIVITAMIN) capsule Take 1 capsule by mouth daily.      Marland Kitchen omeprazole (PRILOSEC) 20 MG capsule TAKE 1 CAPSULE DAILY. 30 capsule 5  . pravastatin (PRAVACHOL) 40 MG tablet Take 40 mg by mouth daily.  3   No current facility-administered medications for this visit.     REVIEW OF SYSTEMS:   Constitutional: Denies fevers, chills or abnormal night sweats Eyes: Denies blurriness of vision, double vision or watery eyes Ears, nose, mouth, throat, and face: Denies mucositis or sore throat Respiratory: Denies cough, dyspnea or wheezes Cardiovascular: Denies palpitation, chest discomfort or lower extremity swelling Gastrointestinal:  Denies nausea, heartburn or change in bowel habits Skin: Denies abnormal skin rashes Lymphatics: Denies new lymphadenopathy or easy bruising Neurological:Denies numbness, tingling or new weaknesses Behavioral/Psych: Mood is stable, no new changes  Breast:  Denies  any palpable lumps or discharge All other systems were reviewed with the patient and are negative.  PHYSICAL EXAMINATION: ECOG PERFORMANCE STATUS: 0 - Asymptomatic  Vitals:   01/21/16 1542  BP: (!) 151/81  Pulse: 95  Resp: 19  Temp: 97.8 F (36.6 C)   Filed Weights   01/21/16 1542  Weight: 180 lb 3.2 oz (81.7 kg)    GENERAL:alert, no distress and comfortable SKIN: skin color, texture, turgor are normal, no rashes or significant  lesions EYES: normal, conjunctiva are pink and non-injected, sclera clear OROPHARYNX:no exudate, no erythema and lips, buccal mucosa, and tongue normal  NECK: supple, thyroid normal size, non-tender, without nodularity LYMPH:  no palpable lymphadenopathy in the cervical, axillary or inguinal LUNGS: clear to auscultation and percussion with normal breathing effort HEART: regular rate & rhythm and no murmurs and no lower extremity edema ABDOMEN:abdomen soft, non-tender and normal bowel sounds Musculoskeletal:no cyanosis of digits and no clubbing  PSYCH: alert & oriented x 3 with fluent speech NEURO: no focal motor/sensory deficits BREAST: No palpable nodules in breast. No palpable axillary or supraclavicular lymphadenopathy (exam performed in the presence of a chaperone)   RADIOGRAPHIC STUDIES: I have personally reviewed the radiological reports and agreed with the findings in the report.  ASSESSMENT AND PLAN:  Ductal carcinoma in situ (DCIS) of left breast Left breast [name] biopsy 12/31/2015: DCIS with calcifications ER 90%, PR -50%, high-grade  Pathology review: I discussed with the patient the difference between DCIS and invasive breast cancer. It is considered a precancerous lesion. DCIS is classified as a 0. It is generally detected through mammograms as calcifications. We discussed the significance of grades and its impact on prognosis. We also discussed the importance of ER and PR receptors and their implications to adjuvant treatment options. Prognosis of DCIS dependence on grade, comedo necrosis. It is anticipated that if not treated, 20-30% of DCIS can develop into invasive breast cancer.  Recommendation: 1. Breast conserving surgery 2. Followed by adjuvant radiation therapy 3. Followed by antiestrogen therapy with tamoxifen 5 years  Tamoxifen counseling: We discussed the risks and benefits of tamoxifen. These include but not limited to insomnia, hot flashes, mood changes, vaginal  dryness, and weight gain. Although rare, serious side effects including endometrial cancer, risk of blood clots were also discussed. We strongly believe that the benefits far outweigh the risks. Patient understands these risks and consented to starting treatment. Planned treatment duration is 5 years.  Return to clinic after surgery to discuss the final pathology report and come up with an adjuvant treatment plan. Since the patient lives in [place], we can coordinate a visit when she sees Dr. Barry Dienes.   All questions were answered. The patient knows to call the clinic with any problems, questions or concerns.    Rulon Eisenmenger, MD 01/21/16

## 2016-01-21 NOTE — Assessment & Plan Note (Signed)
Left breast [name] biopsy 12/31/2015: DCIS with calcifications ER 90%, PR -50%, high-grade  Pathology review: I discussed with the patient the difference between DCIS and invasive breast cancer. It is considered a precancerous lesion. DCIS is classified as a 0. It is generally detected through mammograms as calcifications. We discussed the significance of grades and its impact on prognosis. We also discussed the importance of ER and PR receptors and their implications to adjuvant treatment options. Prognosis of DCIS dependence on grade, comedo necrosis. It is anticipated that if not treated, 20-30% of DCIS can develop into invasive breast cancer.  Recommendation: 1. Breast conserving surgery 2. Followed by adjuvant radiation therapy 3. Followed by antiestrogen therapy with tamoxifen 5 years  Tamoxifen counseling: We discussed the risks and benefits of tamoxifen. These include but not limited to insomnia, hot flashes, mood changes, vaginal dryness, and weight gain. Although rare, serious side effects including endometrial cancer, risk of blood clots were also discussed. We strongly believe that the benefits far outweigh the risks. Patient understands these risks and consented to starting treatment. Planned treatment duration is 5 years.  Return to clinic after surgery to discuss the final pathology report and come up with an adjuvant treatment plan.

## 2016-01-27 ENCOUNTER — Encounter (HOSPITAL_BASED_OUTPATIENT_CLINIC_OR_DEPARTMENT_OTHER)
Admission: RE | Admit: 2016-01-27 | Discharge: 2016-01-27 | Disposition: A | Discharge status: Home / Self Care | Payer: BLUE CROSS/BLUE SHIELD | Source: Ambulatory Visit | Attending: General Surgery | Admitting: General Surgery

## 2016-01-27 ENCOUNTER — Other Ambulatory Visit: Payer: Self-pay | Admitting: General Surgery

## 2016-01-27 ENCOUNTER — Encounter (HOSPITAL_BASED_OUTPATIENT_CLINIC_OR_DEPARTMENT_OTHER): Payer: Self-pay | Admitting: *Deleted

## 2016-01-27 DIAGNOSIS — Z79899 Other long term (current) drug therapy: Secondary | ICD-10-CM | POA: Diagnosis not present

## 2016-01-27 DIAGNOSIS — Z90710 Acquired absence of both cervix and uterus: Secondary | ICD-10-CM | POA: Diagnosis not present

## 2016-01-27 DIAGNOSIS — M1990 Unspecified osteoarthritis, unspecified site: Secondary | ICD-10-CM | POA: Diagnosis not present

## 2016-01-27 DIAGNOSIS — Z170 Estrogen receptor positive status [ER+]: Secondary | ICD-10-CM | POA: Diagnosis not present

## 2016-01-27 DIAGNOSIS — E119 Type 2 diabetes mellitus without complications: Secondary | ICD-10-CM | POA: Insufficient documentation

## 2016-01-27 DIAGNOSIS — Z01818 Encounter for other preprocedural examination: Secondary | ICD-10-CM | POA: Insufficient documentation

## 2016-01-27 DIAGNOSIS — C50412 Malignant neoplasm of upper-outer quadrant of left female breast: Secondary | ICD-10-CM

## 2016-01-27 DIAGNOSIS — C50912 Malignant neoplasm of unspecified site of left female breast: Secondary | ICD-10-CM | POA: Insufficient documentation

## 2016-01-27 DIAGNOSIS — Z803 Family history of malignant neoplasm of breast: Secondary | ICD-10-CM | POA: Diagnosis not present

## 2016-01-27 DIAGNOSIS — K219 Gastro-esophageal reflux disease without esophagitis: Secondary | ICD-10-CM | POA: Diagnosis not present

## 2016-01-27 DIAGNOSIS — Z7984 Long term (current) use of oral hypoglycemic drugs: Secondary | ICD-10-CM | POA: Diagnosis not present

## 2016-01-27 DIAGNOSIS — D0512 Intraductal carcinoma in situ of left breast: Secondary | ICD-10-CM | POA: Diagnosis not present

## 2016-01-27 DIAGNOSIS — E7800 Pure hypercholesterolemia, unspecified: Secondary | ICD-10-CM | POA: Diagnosis not present

## 2016-01-27 DIAGNOSIS — K760 Fatty (change of) liver, not elsewhere classified: Secondary | ICD-10-CM | POA: Diagnosis not present

## 2016-01-27 LAB — BASIC METABOLIC PANEL
Anion gap: 9 (ref 5–15)
BUN: 7 mg/dL (ref 6–20)
CALCIUM: 10.3 mg/dL (ref 8.9–10.3)
CO2: 28 mmol/L (ref 22–32)
CREATININE: 0.85 mg/dL (ref 0.44–1.00)
Chloride: 105 mmol/L (ref 101–111)
GFR calc non Af Amer: >60 mL/min (ref >60)
Glucose, Bld: 129 mg/dL — ABNORMAL HIGH (ref 65–99)
Potassium: 3.9 mmol/L (ref 3.5–5.1)
SODIUM: 142 mmol/L (ref 135–145)

## 2016-01-27 NOTE — Progress Notes (Signed)
Pt was given 8 oz of water with written instructions and verbal to drink by 830 morning of surgery, teach back, pt voiced understanding

## 2016-01-27 NOTE — Progress Notes (Signed)
ekg reviewed by Dr Gifford Shave no further testing needed

## 2016-01-28 ENCOUNTER — Ambulatory Visit
Admission: RE | Admit: 2016-01-28 | Discharge: 2016-01-28 | Disposition: A | Discharge status: Home / Self Care | Payer: BLUE CROSS/BLUE SHIELD | Source: Ambulatory Visit | Attending: General Surgery | Admitting: General Surgery

## 2016-01-28 ENCOUNTER — Telehealth: Payer: Self-pay | Admitting: Hematology and Oncology

## 2016-01-28 DIAGNOSIS — C50412 Malignant neoplasm of upper-outer quadrant of left female breast: Secondary | ICD-10-CM

## 2016-01-28 DIAGNOSIS — Z170 Estrogen receptor positive status [ER+]: Principal | ICD-10-CM

## 2016-01-28 NOTE — Telephone Encounter (Signed)
Appointments scheduled per in-basket message. Called patient to inform her of 1/16 appointment. LVM

## 2016-01-29 NOTE — H&P (Signed)
Kerri Shah. Kerri Shah 01/13/2016 1:33 PM Location: [name] Patient #: [id number] DOB: 26-Jan-1961 Married / Language: English / Race: Black or African American Female   History of Present Illness Kerri Klein MD; 01/13/2016 2:19 PM) The patient is a 55 year old female who presents with breast cancer. Pt is a 55 yo F referred by Dr. Manuella Shah for consultation regarding a new left breast cancer. She presented with screening detected calcifications. Diagnostic imaging showed a group of heterogeneous calcifications with linear forms and linear orientation at 12:30. This was 1 cm and then. 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 The patient does have occasional breast pain and describes her breasts as lumpy bumpy. She has not ever had to have a breast biopsy. She does have a family history of breast cancer in 2 maternal aunts. She is status post hysterectomy for fibroids.  Mammo FINDINGS: Additional views of the left breast demonstrate a group of heterogeneous calcifications with linear forms and linear orientation to be present within the left breast at the 12:30 o'clock position. These span 10 mm and are suspicious for possible DCIS. Tissue sampling via stereotactic biopsy is recommended. Additional views of the area of asymmetry within the lateral left breast demonstrate the appearance to be similar to prior studies consistent with mildly asymmetric stable benign fibroglandular tissue.  Mammographic images were processed with CAD.  IMPRESSION: 1 cm group of suspicious calcifications located within the left breast at the 12:30 o'clock position. Tissue sampling via stereotactic biopsy is recommended  Pathology Diagnosis Breast, left, needle core biopsy, [name] - DUCTAL CARCINOMA IN SITU WITH CALCIFICATIONS.   Other Problems Kerri Shah, [place]; 01/13/2016 1:33 PM) Arthritis  Back Pain  Breast Cancer   Diabetes Mellitus  General anesthesia - complications  Hemorrhoids  Hypercholesterolemia  Lump In Breast   Past Surgical History Kerri Shah, CMA; 01/13/2016 1:33 PM) Breast Biopsy  Left. Colon Polyp Removal - Colonoscopy  Hysterectomy (not due to cancer) - Partial   Diagnostic Studies History Kerri Shah, [place]; 01/13/2016 1:33 PM) Colonoscopy  5-10 years ago Mammogram  within last year Pap Smear  1-5 years ago  Allergies Kerri Shah, CMA; 01/13/2016 1:34 PM) No Known Drug Allergies 01/13/2016  Medication History Kerri Shah, CMA; 01/13/2016 1:35 PM) MetFORMIN HCl (500MG  Tablet, Oral daily) Active. Pravastatin Sodium (40MG  Tablet, Oral daily) Active. PriLOSEC (20MG  Capsule DR, Oral daily) Active. Multi-Minerals (Oral daily) Active. Medications Reconciled  Social History Kerri Shah, [place]; 01/13/2016 1:33 PM) Caffeine use  Carbonated beverages, Tea. No alcohol use  No drug use  Tobacco use  Never smoker.  Family History Kerri Shah, [place]; 01/13/2016 1:33 PM) Alcohol Abuse  Father. Arthritis  Father, Mother. Bleeding disorder  Mother. Breast Cancer  Family Members In General. Cerebrovascular Accident  Father. Colon Cancer  Brother, Mother. Colon Polyps  Mother. Diabetes Mellitus  Brother, Family Members In [place], Sister. Heart Disease  Family Members In General.  Pregnancy / Birth History Kerri Shah, [place]; 01/13/2016 1:34 PM) Age at menarche  64 years. Age of menopause  26-55 Gravida  2 Irregular periods  Length (months) of breastfeeding  3-6 Maternal age  72-25 Para  2    Review of Systems ([place]; 01/13/2016 1:34 PM) General Present- Fatigue and Weight Loss. Not Present- Appetite Loss, Chills, Fever, Night Sweats and Weight Gain. Skin Not Present- Change in Wart/Mole, Dryness, Hives, Jaundice, New Lesions, Non-Healing Wounds, Rash and Ulcer. HEENT Present- Earache, Ringing in  the Ears, Seasonal  Allergies and Wears glasses/contact lenses. Not Present- Hearing Loss, Hoarseness, Nose Bleed, Oral Ulcers, Sinus Pain, Sore Throat, Visual Disturbances and Yellow Eyes. Respiratory Present- Snoring. Not Present- Bloody sputum, Chronic Cough, Difficulty Breathing and Wheezing. Breast Present- Breast Mass, Breast Pain and Skin Changes. Not Present- Nipple Discharge. Gastrointestinal Present- Hemorrhoids. Not Present- Abdominal Pain, Bloating, Bloody Stool, Change in Bowel Habits, Chronic diarrhea, Constipation, Difficulty Swallowing, Excessive gas, Gets full quickly at meals, Indigestion, Nausea, Rectal Pain and Vomiting. Female Genitourinary Not Present- Frequency, Nocturia, Painful Urination, Pelvic Pain and Urgency. Musculoskeletal Present- Joint Pain. Not Present- Back Pain, Joint Stiffness, Muscle Pain, Muscle Weakness and Swelling of Extremities. Neurological Present- Headaches. Not Present- Decreased Memory, Fainting, Numbness, Seizures, Tingling, Tremor, Trouble walking and Weakness. Psychiatric Not Present- Anxiety, Bipolar, Change in Sleep Pattern, Depression, Fearful and Frequent crying. Endocrine Not Present- Cold Intolerance, Excessive Hunger, Hair Changes, Heat Intolerance, Hot flashes and New Diabetes. Hematology Present- Easy Bruising. Not Present- Blood Thinners, Excessive bleeding, Gland problems, HIV and Persistent Infections.  Vitals Kerri Shah CMA; 01/13/2016 1:36 PM) 01/13/2016 1:35 PM Weight: 180.63 lb Height: 66in Body Surface Area: 1.92 m Body Mass Index: 29.15 kg/m  Temp.: 98.27F  Pulse: 96 (Regular)  BP: 138/84 (Sitting, Left Arm, Standard)       Physical Exam Kerri Klein MD; 01/13/2016 2:20 PM) General Mental Status-Alert. General Appearance-Consistent with stated age. Hydration-Well hydrated. Voice-Normal.  Head and Neck Head-normocephalic, atraumatic with no lesions or palpable masses. Trachea-midline. Thyroid Gland  Characteristics - normal size and consistency.  Eye Eyeball - Bilateral-Extraocular movements intact. Sclera/Conjunctiva - Bilateral-No scleral icterus.  Chest and Lung Exam Chest and lung exam reveals -quiet, even and easy respiratory effort with no use of accessory muscles and on auscultation, normal breath sounds, no adventitious sounds and normal vocal resonance. Inspection Chest Wall - Normal. Back - normal.  Breast Note: ptotic and symmetric bilaterally. No palpable masses in either breast. Some fibrocystic changes bilaterally. No LAD. No nipple retraction or skin dimpling. Small lateral left breast hematoma.   Cardiovascular Cardiovascular examination reveals -normal heart sounds, regular rate and rhythm with no murmurs and normal pedal pulses bilaterally.  Abdomen Inspection Inspection of the abdomen reveals - No Hernias. Palpation/Percussion Palpation and Percussion of the abdomen reveal - Soft, Non Tender, No Rebound tenderness, No Rigidity (guarding) and No hepatosplenomegaly. Auscultation Auscultation of the abdomen reveals - Bowel sounds normal.  Neurologic Neurologic evaluation reveals -alert and oriented x 3 with no impairment of recent or remote memory. Mental Status-Normal.  Musculoskeletal Global Assessment -Note: no gross deformities.  Normal Exam - Left-Upper Extremity Strength Normal and Lower Extremity Strength Normal. Normal Exam - Right-Upper Extremity Strength Normal and Lower Extremity Strength Normal.  Lymphatic Head & Neck  General Head & Neck Lymphatics: Bilateral - Description - Normal. Axillary  General Axillary Region: Bilateral - Description - Normal. Tenderness - Non Tender. Femoral & Inguinal  Generalized Femoral & Inguinal Lymphatics: Bilateral - Description - No Generalized lymphadenopathy.    Assessment & Plan Kerri Klein MD; 01/13/2016 2:24 PM) MALIGNANT NEOPLASM OF UPPER-OUTER QUADRANT OF LEFT BREAST IN  FEMALE, ESTROGEN RECEPTOR POSITIVE (C50.412) Impression: Patient has presented with a clinical Tis N0 left breast cancer which makes this a stage 0.  She is a good candidate for breast conservation. I discussed seed localized lumpectomy with her. I advised that this will most likely be followed by radiation and anti-hormonal treatment. I will need to refer her to medical oncology and radiation oncology.  The  surgical procedure was described to the patient. I discussed the incision type and location and that we would need radiology involved on with a wire or seed marker and/or sentinel node.  The risks and benefits of the procedure were described to the patient and she wishes to proceed.  We discussed the risks bleeding, infection, damage to other structures, need for further procedures/surgeries. We discussed the risk of seroma. The patient was advised if the area in the breast in cancer, we may need to go back to surgery for additional tissue to obtain negative margins or for a lymph node biopsy. The patient was advised that these are the most common complications, but that others can occur as well. They were advised against taking aspirin or other anti-inflammatory agents/blood thinners the week before surgery. Current Plans You are being scheduled for surgery - Our schedulers will call you.  You should hear from our office's scheduling department within 5 working days about the location, date, and time of surgery. We try to make accommodations for patient's preferences in scheduling surgery, but sometimes the OR schedule or the surgeon's schedule prevents [place] from making those accommodations.  If you have not heard from our office [phone]) in 5 working days, call the office and ask for your surgeon's nurse.  If you have other questions about your diagnosis, plan, or surgery, call the office and ask for your surgeon's nurse.  Pt Education - [facility] breast cancer surgery: discussed with patient  and provided information. Pt Education - [facility] Breast Cancer Information Given - [facility] "Breast Journey" Package Referred to Oncology, for evaluation and follow up (Oncology). Routine. Referred to Radiation Oncology, for evaluation and follow up (Radiation Oncology). Routine. FAMILY HISTORY OF BREAST CANCER IN FEMALE (Z80.3) Impression: Refer to genetics. Current Plans Referred to Genetic Counseling, for evaluation and follow up (Medical Genetics). Routine.   Signed by Kerri Klein, MD (01/13/2016 2:25 PM)

## 2016-01-30 ENCOUNTER — Encounter (HOSPITAL_BASED_OUTPATIENT_CLINIC_OR_DEPARTMENT_OTHER): Payer: Self-pay | Admitting: *Deleted

## 2016-01-30 ENCOUNTER — Encounter (HOSPITAL_BASED_OUTPATIENT_CLINIC_OR_DEPARTMENT_OTHER)
Admission: RE | Disposition: A | Discharge status: Home / Self Care | Payer: Self-pay | Source: Ambulatory Visit | Attending: General Surgery

## 2016-01-30 ENCOUNTER — Ambulatory Visit (HOSPITAL_BASED_OUTPATIENT_CLINIC_OR_DEPARTMENT_OTHER): Payer: BLUE CROSS/BLUE SHIELD | Admitting: Anesthesiology

## 2016-01-30 ENCOUNTER — Ambulatory Visit (HOSPITAL_BASED_OUTPATIENT_CLINIC_OR_DEPARTMENT_OTHER)
Admission: RE | Admit: 2016-01-30 | Discharge: 2016-01-30 | Disposition: A | Discharge status: Home / Self Care | Payer: BLUE CROSS/BLUE SHIELD | Source: Ambulatory Visit | Attending: General Surgery | Admitting: General Surgery

## 2016-01-30 ENCOUNTER — Ambulatory Visit
Admission: RE | Admit: 2016-01-30 | Discharge: 2016-01-30 | Disposition: A | Discharge status: Home / Self Care | Payer: BLUE CROSS/BLUE SHIELD | Source: Ambulatory Visit | Attending: General Surgery | Admitting: General Surgery

## 2016-01-30 DIAGNOSIS — Z170 Estrogen receptor positive status [ER+]: Principal | ICD-10-CM

## 2016-01-30 DIAGNOSIS — Z7984 Long term (current) use of oral hypoglycemic drugs: Secondary | ICD-10-CM | POA: Insufficient documentation

## 2016-01-30 DIAGNOSIS — E7800 Pure hypercholesterolemia, unspecified: Secondary | ICD-10-CM | POA: Insufficient documentation

## 2016-01-30 DIAGNOSIS — M1990 Unspecified osteoarthritis, unspecified site: Secondary | ICD-10-CM | POA: Insufficient documentation

## 2016-01-30 DIAGNOSIS — C50412 Malignant neoplasm of upper-outer quadrant of left female breast: Secondary | ICD-10-CM | POA: Insufficient documentation

## 2016-01-30 DIAGNOSIS — Z803 Family history of malignant neoplasm of breast: Secondary | ICD-10-CM | POA: Insufficient documentation

## 2016-01-30 DIAGNOSIS — K760 Fatty (change of) liver, not elsewhere classified: Secondary | ICD-10-CM | POA: Insufficient documentation

## 2016-01-30 DIAGNOSIS — K219 Gastro-esophageal reflux disease without esophagitis: Secondary | ICD-10-CM | POA: Insufficient documentation

## 2016-01-30 DIAGNOSIS — Z79899 Other long term (current) drug therapy: Secondary | ICD-10-CM | POA: Insufficient documentation

## 2016-01-30 DIAGNOSIS — E119 Type 2 diabetes mellitus without complications: Secondary | ICD-10-CM | POA: Insufficient documentation

## 2016-01-30 DIAGNOSIS — Z90710 Acquired absence of both cervix and uterus: Secondary | ICD-10-CM | POA: Insufficient documentation

## 2016-01-30 HISTORY — DX: Other complications of anesthesia, initial encounter: T88.59XA

## 2016-01-30 HISTORY — PX: BREAST LUMPECTOMY WITH RADIOACTIVE SEED LOCALIZATION: SHX6424

## 2016-01-30 HISTORY — DX: Other specified postprocedural states: Z98.890

## 2016-01-30 HISTORY — DX: Other specified postprocedural states: R11.2

## 2016-01-30 HISTORY — DX: Adverse effect of unspecified anesthetic, initial encounter: T41.45XA

## 2016-01-30 LAB — GLUCOSE, CAPILLARY: Glucose-Capillary: 120 mg/dL — ABNORMAL HIGH (ref 65–99)

## 2016-01-30 SURGERY — BREAST LUMPECTOMY WITH RADIOACTIVE SEED LOCALIZATION
Anesthesia: General | Site: Breast | Laterality: Left

## 2016-01-30 MED ORDER — LIDOCAINE 2% (20 MG/ML) 5 ML SYRINGE
INTRAMUSCULAR | Status: DC | PRN
  Administered 2016-01-30: 80 mg via INTRAVENOUS

## 2016-01-30 MED ORDER — HYDROCODONE-ACETAMINOPHEN 7.5-325 MG PO TABS: 1.0000 | ORAL_TABLET | Freq: Once | ORAL | Status: DC | PRN

## 2016-01-30 MED ORDER — CHLORHEXIDINE GLUCONATE CLOTH 2 % EX PADS: 6.0000 | MEDICATED_PAD | Freq: Once | CUTANEOUS | Status: DC

## 2016-01-30 MED ORDER — SODIUM CHLORIDE 0.9% FLUSH: 3.0000 mL | INTRAVENOUS | Status: DC | PRN

## 2016-01-30 MED ORDER — GABAPENTIN 300 MG PO CAPS
ORAL_CAPSULE | ORAL | Status: AC
  Filled 2016-01-30: qty 1

## 2016-01-30 MED ORDER — LACTATED RINGERS IV SOLN
INTRAVENOUS | Status: DC
  Administered 2016-01-30 (×2): via INTRAVENOUS

## 2016-01-30 MED ORDER — PROPOFOL 10 MG/ML IV BOLUS
INTRAVENOUS | Status: DC | PRN
  Administered 2016-01-30: 180 mg via INTRAVENOUS

## 2016-01-30 MED ORDER — CEFAZOLIN SODIUM-DEXTROSE 2-4 GM/100ML-% IV SOLN
2.0000 g | INTRAVENOUS | Status: AC
  Administered 2016-01-30: 2 g via INTRAVENOUS

## 2016-01-30 MED ORDER — MIDAZOLAM HCL 2 MG/2ML IJ SOLN
1.0000 mg | INTRAMUSCULAR | Status: DC | PRN
  Administered 2016-01-30: 2 mg via INTRAVENOUS

## 2016-01-30 MED ORDER — MEPERIDINE HCL 25 MG/ML IJ SOLN: 6.2500 mg | INTRAMUSCULAR | Status: DC | PRN

## 2016-01-30 MED ORDER — PROPOFOL 10 MG/ML IV BOLUS
INTRAVENOUS | Status: AC
  Filled 2016-01-30: qty 20

## 2016-01-30 MED ORDER — FENTANYL CITRATE (PF) 100 MCG/2ML IJ SOLN
50.0000 ug | INTRAMUSCULAR | Status: DC | PRN
  Administered 2016-01-30: 100 ug via INTRAVENOUS

## 2016-01-30 MED ORDER — SCOPOLAMINE 1 MG/3DAYS TD PT72
MEDICATED_PATCH | TRANSDERMAL | Status: AC
  Filled 2016-01-30: qty 1

## 2016-01-30 MED ORDER — FENTANYL CITRATE (PF) 100 MCG/2ML IJ SOLN: 25.0000 ug | INTRAMUSCULAR | Status: DC | PRN

## 2016-01-30 MED ORDER — ACETAMINOPHEN 325 MG PO TABS: 650.0000 mg | ORAL_TABLET | ORAL | Status: DC | PRN

## 2016-01-30 MED ORDER — OXYCODONE HCL 5 MG PO TABS: 5.0000 mg | ORAL_TABLET | ORAL | Status: DC | PRN

## 2016-01-30 MED ORDER — OXYCODONE HCL 5 MG PO TABS: 5.0000 mg | ORAL_TABLET | Freq: Four times a day (QID) | ORAL | 0 refills | Status: DC | PRN

## 2016-01-30 MED ORDER — SODIUM CHLORIDE 0.9% FLUSH: 3.0000 mL | Freq: Two times a day (BID) | INTRAVENOUS | Status: DC

## 2016-01-30 MED ORDER — ACETAMINOPHEN 500 MG PO TABS
1000.0000 mg | ORAL_TABLET | ORAL | Status: AC
  Administered 2016-01-30: 1000 mg via ORAL

## 2016-01-30 MED ORDER — ACETAMINOPHEN 500 MG PO TABS
ORAL_TABLET | ORAL | Status: AC
  Filled 2016-01-30: qty 1

## 2016-01-30 MED ORDER — ACETAMINOPHEN 500 MG PO TABS
ORAL_TABLET | ORAL | Status: AC
  Filled 2016-01-30: qty 2

## 2016-01-30 MED ORDER — SCOPOLAMINE 1 MG/3DAYS TD PT72
1.0000 | MEDICATED_PATCH | Freq: Once | TRANSDERMAL | Status: DC | PRN
  Administered 2016-01-30: 1.5 mg via TRANSDERMAL

## 2016-01-30 MED ORDER — METOCLOPRAMIDE HCL 5 MG/ML IJ SOLN: 10.0000 mg | Freq: Once | INTRAMUSCULAR | Status: DC | PRN

## 2016-01-30 MED ORDER — ACETAMINOPHEN 650 MG RE SUPP: 650.0000 mg | RECTAL | Status: DC | PRN

## 2016-01-30 MED ORDER — SODIUM CHLORIDE 0.9 % IV SOLN: 250.0000 mL | INTRAVENOUS | Status: DC | PRN

## 2016-01-30 MED ORDER — FENTANYL CITRATE (PF) 100 MCG/2ML IJ SOLN
INTRAMUSCULAR | Status: AC
  Filled 2016-01-30: qty 2

## 2016-01-30 MED ORDER — LIDOCAINE-EPINEPHRINE (PF) 1 %-1:200000 IJ SOLN
INTRAMUSCULAR | Status: DC | PRN
  Administered 2016-01-30: 20 mL via INTRAMUSCULAR

## 2016-01-30 MED ORDER — GABAPENTIN 300 MG PO CAPS
300.0000 mg | ORAL_CAPSULE | ORAL | Status: AC
  Administered 2016-01-30: 300 mg via ORAL

## 2016-01-30 MED ORDER — MIDAZOLAM HCL 2 MG/2ML IJ SOLN
INTRAMUSCULAR | Status: AC
  Filled 2016-01-30: qty 2

## 2016-01-30 SURGICAL SUPPLY — 60 items
APPLIER CLIP 9.375 MED OPEN (MISCELLANEOUS)
BINDER BREAST LRG (GAUZE/BANDAGES/DRESSINGS) IMPLANT
BINDER BREAST MEDIUM (GAUZE/BANDAGES/DRESSINGS) IMPLANT
BINDER BREAST XLRG (GAUZE/BANDAGES/DRESSINGS) IMPLANT
BINDER BREAST XXLRG (GAUZE/BANDAGES/DRESSINGS) ×3 IMPLANT
BLADE SURG 10 STRL SS (BLADE) ×3 IMPLANT
BLADE SURG 15 STRL LF DISP TIS (BLADE) ×1 IMPLANT
BLADE SURG 15 STRL SS (BLADE) ×2
CANISTER SUC SOCK COL 7IN (MISCELLANEOUS) IMPLANT
CANISTER SUCT 1200ML W/VALVE (MISCELLANEOUS) ×3 IMPLANT
CHLORAPREP W/TINT 26ML (MISCELLANEOUS) ×3 IMPLANT
CLIP APPLIE 9.375 MED OPEN (MISCELLANEOUS) IMPLANT
CLIP TI LARGE 6 (CLIP) ×3 IMPLANT
CLIP TI MEDIUM 6 (CLIP) IMPLANT
CLOSURE WOUND 1/2 X4 (GAUZE/BANDAGES/DRESSINGS) ×1
COVER BACK TABLE 60X90IN (DRAPES) ×3 IMPLANT
COVER MAYO STAND STRL (DRAPES) ×3 IMPLANT
COVER PROBE W GEL 5X96 (DRAPES) ×3 IMPLANT
DECANTER SPIKE VIAL GLASS SM (MISCELLANEOUS) IMPLANT
DERMABOND ADVANCED (GAUZE/BANDAGES/DRESSINGS) ×2
DERMABOND ADVANCED .7 DNX12 (GAUZE/BANDAGES/DRESSINGS) ×1 IMPLANT
DEVICE DUBIN W/COMP PLATE 8390 (MISCELLANEOUS) ×6 IMPLANT
DRAPE LAPAROSCOPIC ABDOMINAL (DRAPES) ×3 IMPLANT
DRAPE UTILITY XL STRL (DRAPES) ×3 IMPLANT
DRSG PAD ABDOMINAL 8X10 ST (GAUZE/BANDAGES/DRESSINGS) ×3 IMPLANT
ELECT COATED BLADE 2.86 ST (ELECTRODE) ×3 IMPLANT
ELECT REM PT RETURN 9FT ADLT (ELECTROSURGICAL) ×3
ELECTRODE REM PT RTRN 9FT ADLT (ELECTROSURGICAL) ×1 IMPLANT
GLOVE BIO SURGEON STRL SZ 6 (GLOVE) ×3 IMPLANT
GLOVE BIOGEL PI IND STRL 6.5 (GLOVE) ×3 IMPLANT
GLOVE BIOGEL PI INDICATOR 6.5 (GLOVE) ×6
GLOVE ECLIPSE 6.5 STRL STRAW (GLOVE) ×3 IMPLANT
GOWN STRL REUS W/ TWL LRG LVL3 (GOWN DISPOSABLE) ×1 IMPLANT
GOWN STRL REUS W/TWL 2XL LVL3 (GOWN DISPOSABLE) ×3 IMPLANT
GOWN STRL REUS W/TWL LRG LVL3 (GOWN DISPOSABLE) ×2
ILLUMINATOR WAVEGUIDE N/F (MISCELLANEOUS) IMPLANT
KIT MARKER MARGIN INK (KITS) ×3 IMPLANT
LIGHT WAVEGUIDE WIDE FLAT (MISCELLANEOUS) ×3 IMPLANT
NEEDLE HYPO 25X1 1.5 SAFETY (NEEDLE) ×3 IMPLANT
NS IRRIG 1000ML POUR BTL (IV SOLUTION) IMPLANT
PACK BASIN DAY SURGERY FS (CUSTOM PROCEDURE TRAY) ×3 IMPLANT
PENCIL BUTTON HOLSTER BLD 10FT (ELECTRODE) ×3 IMPLANT
SLEEVE SCD COMPRESS KNEE MED (MISCELLANEOUS) ×3 IMPLANT
SPONGE GAUZE 4X4 12PLY STER LF (GAUZE/BANDAGES/DRESSINGS) ×3 IMPLANT
SPONGE LAP 18X18 X RAY DECT (DISPOSABLE) ×3 IMPLANT
STRIP CLOSURE SKIN 1/2X4 (GAUZE/BANDAGES/DRESSINGS) ×2 IMPLANT
SUT MNCRL AB 4-0 PS2 18 (SUTURE) ×3 IMPLANT
SUT MON AB 5-0 PS2 18 (SUTURE) IMPLANT
SUT SILK 2 0 SH (SUTURE) IMPLANT
SUT VIC AB 2-0 SH 27 (SUTURE) ×2
SUT VIC AB 2-0 SH 27XBRD (SUTURE) ×1 IMPLANT
SUT VIC AB 3-0 SH 27 (SUTURE) ×2
SUT VIC AB 3-0 SH 27X BRD (SUTURE) ×1 IMPLANT
SUT VIC AB 5-0 PS2 18 (SUTURE) IMPLANT
SYR CONTROL 10ML LL (SYRINGE) ×3 IMPLANT
TOWEL OR 17X24 6PK STRL BLUE (TOWEL DISPOSABLE) ×3 IMPLANT
TOWEL OR NON WOVEN STRL DISP B (DISPOSABLE) ×3 IMPLANT
TUBE CONNECTING 20'X1/4 (TUBING)
TUBE CONNECTING 20X1/4 (TUBING) IMPLANT
YANKAUER SUCT BULB TIP NO VENT (SUCTIONS) ×3 IMPLANT

## 2016-01-30 NOTE — Transfer of Care (Signed)
**Note Kerri-Identified via Obfuscation** Immediate Anesthesia Transfer of Care Note  Patient: [name]  Procedure(s) Performed: Procedure(s): BREAST LUMPECTOMY WITH RADIOACTIVE SEED LOCALIZATION (Left)  Patient Location: PACU  Anesthesia Type:General  Level of Consciousness: sedated  Airway & Oxygen Therapy: Patient Spontanous Breathing and Patient connected to face mask oxygen  Post-op Assessment: Report given to RN and Post -op Vital signs reviewed and stable  Post vital signs: Reviewed and stable  Last Vitals:  Vitals:   01/30/16 1021  BP: (!) 149/75  Pulse: 87  Resp: 18  Temp: 36.6 C    Last Pain:  Vitals:   01/30/16 1021  TempSrc: Oral      Patients Stated Pain Goal: 0 ([id number])  Complications: No apparent anesthesia complications

## 2016-01-30 NOTE — Discharge Instructions (Addendum)
Post Anesthesia Home Care Instructions  Activity: Get plenty of rest for the remainder of the day. A responsible adult should stay with you for 24 hours following the procedure.  For the next 24 hours, DO NOT: -Drive a car -Paediatric nurse -Drink alcoholic beverages -Take any medication unless instructed by your physician -Make any legal decisions or sign important papers.  Meals: Start with liquid foods such as gelatin or soup. Progress to regular foods as tolerated. Avoid greasy, spicy, heavy foods. If nausea and/or vomiting occur, drink only clear liquids until the nausea and/or vomiting subsides. Call your physician if vomiting continues.  Special Instructions/Symptoms: Your throat may feel dry or sore from the anesthesia or the breathing tube placed in your throat during surgery. If this causes discomfort, gargle with warm salt water. The discomfort should disappear within 24 hours.  If you had a scopolamine patch placed behind your ear for the management of post- operative nausea and/or vomiting:  1. The medication in the patch is effective for 72 hours, after which it should be removed.  Wrap patch in a tissue and discard in the trash. Wash hands thoroughly with soap and water. 2. You may remove the patch earlier than 72 hours if you experience unpleasant side effects which may include dry mouth, dizziness or visual disturbances. 3. Avoid touching the patch. Wash your hands with soap and water after contact with the patch.      [facility] Office Phone Number [phone]  BREAST BIOPSY/ PARTIAL MASTECTOMY: POST OP INSTRUCTIONS  Always review your discharge instruction sheet given to you by the facility where your surgery was performed.  IF YOU HAVE DISABILITY OR FAMILY LEAVE FORMS, YOU MUST BRING THEM TO THE OFFICE FOR PROCESSING.  DO NOT GIVE THEM TO YOUR DOCTOR.  1. A prescription for pain medication may be given to you upon discharge.  Take your pain  medication as prescribed, if needed.  If narcotic pain medicine is not needed, then you may take acetaminophen (Tylenol) or ibuprofen (Advil) as needed. 2. Take your usually prescribed medications unless otherwise directed 3. If you need a refill on your pain medication, please contact your pharmacy.  They will contact our office to request authorization.  Prescriptions will not be filled after 5pm or on week-ends. 4. You should eat very light the first 24 hours after surgery, such as soup, crackers, pudding, etc.  Resume your normal diet the day after surgery. 5. Most patients will experience some swelling and bruising in the breast.  Ice packs and a good support bra will help.  Swelling and bruising can take several days to resolve.  6. It is common to experience some constipation if taking pain medication after surgery.  Increasing fluid intake and taking a stool softener will usually help or prevent this problem from occurring.  A mild laxative (Milk of Magnesia or Miralax) should be taken according to package directions if there are no bowel movements after 48 hours. 7. Unless discharge instructions indicate otherwise, you may remove your bandages 48 hours after surgery, and you may shower at that time.  You may have steri-strips (small skin tapes) in place directly over the incision.  These strips should be left on the skin for 7-10 days.   Any sutures or staples will be removed at the office during your follow-up visit. 8. ACTIVITIES:  You may resume regular daily activities (gradually increasing) beginning the next day.  Wearing a good support bra or sports bra (or the breast  binder) minimizes pain and swelling.  You may have sexual intercourse when it is comfortable. a. You may drive when you no longer are taking prescription pain medication, you can comfortably wear a seatbelt, and you can safely maneuver your car and apply brakes. b. RETURN TO WORK:  __________1 week_______________ 9. You should  see your doctor in the office for a follow-up appointment approximately two weeks after your surgery.  Your doctors nurse will typically make your follow-up appointment when she calls you with your pathology report.  Expect your pathology report 2-3 business days after your surgery.  You may call to check if you do not hear from [place] after three days.   WHEN TO CALL YOUR DOCTOR: 1. Fever over 101.0 2. Nausea and/or vomiting. 3. Extreme swelling or bruising. 4. Continued bleeding from incision. 5. Increased pain, redness, or drainage from the incision.  The clinic staff is available to answer your questions during regular business hours.  Please dont hesitate to call and ask to speak to one of the nurses for clinical concerns.  If you have a medical emergency, go to the nearest emergency room or call 911.  A surgeon from [facility] is always on call at the hospital.  For further questions, please visit [email or web address]    Post Anesthesia Home Care Instructions  Activity: Get plenty of rest for the remainder of the day. A responsible adult should stay with you for 24 hours following the procedure.  For the next 24 hours, DO NOT: -Drive a car -Paediatric nurse -Drink alcoholic beverages -Take any medication unless instructed by your physician -Make any legal decisions or sign important papers.  Meals: Start with liquid foods such as gelatin or soup. Progress to regular foods as tolerated. Avoid greasy, spicy, heavy foods. If nausea and/or vomiting occur, drink only clear liquids until the nausea and/or vomiting subsides. Call your physician if vomiting continues.  Special Instructions/Symptoms: Your throat may feel dry or sore from the anesthesia or the breathing tube placed in your throat during surgery. If this causes discomfort, gargle with warm salt water. The discomfort should disappear within 24 hours.  If you had a scopolamine patch placed behind your ear  for the management of post- operative nausea and/or vomiting:  1. The medication in the patch is effective for 72 hours, after which it should be removed.  Wrap patch in a tissue and discard in the trash. Wash hands thoroughly with soap and water. 2. You may remove the patch earlier than 72 hours if you experience unpleasant side effects which may include dry mouth, dizziness or visual disturbances. 3. Avoid touching the patch. Wash your hands with soap and water after contact with the patch.

## 2016-01-30 NOTE — Op Note (Signed)
Left Breast Radioactive seed localized lumpectomy  Indications: This patient presents with history of left breast cancer, [name], cTis  Pre-operative Diagnosis: left breast cancer  Post-operative Diagnosis: same  Surgeon: [name]   Anesthesia: General endotracheal anesthesia  ASA Class: 2  Procedure Details  The patient was seen in the Holding Room. The risks, benefits, complications, treatment options, and expected outcomes were discussed with the patient. The possibilities of bleeding, infection, the need for additional procedures, failure to diagnose a condition, and creating a complication requiring transfusion or operation were discussed with the patient. The patient concurred with the proposed plan, giving informed consent.  The site of surgery properly noted/marked. The patient was taken to Operating Room # 8, identified, and the procedure verified as Left Breast Seed localized Lumpectomy. A Time Out was held and the above information confirmed.  The left breast and chest were prepped and draped in standard fashion. The lumpectomy was performed by creating a circumareolar incision at the upper outer quadrant of the breast over the previously placed radioactive seed.  Dissection was carried down to around the point of maximum signal intensity. The cautery was used to perform the dissection.  Hemostasis was achieved with cautery. The edges of the cavity were marked with large clips, with one each medial, lateral, inferior and superior, and two clips posteriorly.   The specimen was inked with the margin marker paint kit.    Specimen radiography confirmed inclusion of the mammographic lesion and the seed. Additional inferior margin and medial margin were taken with cautery.  The clip was in the medial margin. The background signal in the breast was zero.  The wound was irrigated and closed with 3-0 vicryl in layers and 4-0 monocryl subcuticular suture.      Sterile dressings were applied. At  the end of the operation, all sponge, instrument, and needle counts were correct.  Findings: grossly clear surgical margins  Estimated Blood Loss:  min         Specimens: left breast lumpectomy, additional inferior margin.  Additional medial margin.           Complications:  None; patient tolerated the procedure well.         Disposition: PACU - hemodynamically stable.         Condition: stable

## 2016-01-30 NOTE — Anesthesia Postprocedure Evaluation (Signed)
Anesthesia Post Note  Patient: Kerri Shah  Procedure(s) Performed: Procedure(s) (LRB): BREAST LUMPECTOMY WITH RADIOACTIVE SEED LOCALIZATION (Left)  Patient location during evaluation: PACU Anesthesia Type: General Level of consciousness: awake and alert and oriented Pain management: pain level controlled Vital Signs Assessment: post-procedure vital signs reviewed and stable Respiratory status: spontaneous breathing, nonlabored ventilation and respiratory function stable Cardiovascular status: blood pressure returned to baseline and stable Postop Assessment: no signs of nausea or vomiting Anesthetic complications: no       Last Vitals:  Vitals:   01/30/16 1330 01/30/16 1345  BP: (!) 151/87 (!) 158/89  Pulse: 94 91  Resp: 16 15  Temp:      Last Pain:  Vitals:   01/30/16 1329  TempSrc:   PainSc: 0-No pain                 Lianna Sitzmann A.

## 2016-01-30 NOTE — Anesthesia Procedure Notes (Signed)
Procedure Name: LMA Insertion Performed by: Zariah Jost D Pre-anesthesia Checklist: Patient identified, Emergency Drugs available, Suction available and Patient being monitored Patient Re-evaluated:Patient Re-evaluated prior to inductionOxygen Delivery Method: Circle system utilized Preoxygenation: Pre-oxygenation with 100% oxygen Intubation Type: IV induction Ventilation: Mask ventilation without difficulty LMA: LMA inserted LMA Size: 4.0 Number of attempts: 1 Airway Equipment and Method: Bite block Placement Confirmation: positive ETCO2 Tube secured with: Tape Dental Injury: Teeth and Oropharynx as per pre-operative assessment        

## 2016-01-30 NOTE — Anesthesia Procedure Notes (Signed)
Performed by: Fantasia Jinkins D       

## 2016-01-30 NOTE — Interval H&P Note (Signed)
History and Physical Interval Note:  01/30/2016 11:00 AM  Kerri Shah  has presented today for surgery, with the diagnosis of LEFT BREAST CANCER  The various methods of treatment have been discussed with the patient and family. After consideration of risks, benefits and other options for treatment, the patient has consented to  Procedure(s): BREAST LUMPECTOMY WITH RADIOACTIVE SEED LOCALIZATION (Left) as a surgical intervention .  The patient's history has been reviewed, patient examined, no change in status, stable for surgery.  I have reviewed the patient's chart and labs.  Questions were answered to the patient's satisfaction.     Abrar Bilton

## 2016-01-30 NOTE — Anesthesia Preprocedure Evaluation (Signed)
Anesthesia Evaluation  Patient identified by MRN, date of birth, ID band Patient awake    Reviewed: Allergy & Precautions, NPO status , Patient's Chart, lab work & pertinent test results  History of Anesthesia Complications (+) PONV and history of anesthetic complications  Airway Mallampati: II  TM Distance: >3 FB Neck ROM: Full    Dental no notable dental hx. (+) Caps, Teeth Intact   Pulmonary neg pulmonary ROS,    Pulmonary exam normal breath sounds clear to auscultation       Cardiovascular Normal cardiovascular exam Rhythm:Regular Rate:Normal     Neuro/Psych negative neurological ROS  negative psych ROS   GI/Hepatic GERD  Medicated and Controlled,Hx/o fatty liver   Endo/Other  diabetes, Well Controlled, Type 2, Oral Hypoglycemic AgentsHyperlipidemia  Renal/GU negative Renal ROS  negative genitourinary   Musculoskeletal  (+) Arthritis , Osteoarthritis,    Abdominal   Peds  Hematology negative hematology ROS (+)   Anesthesia Other Findings   Reproductive/Obstetrics                             Anesthesia Physical Anesthesia Plan  ASA: II  Anesthesia Plan: General   Post-op Pain Management:    Induction: Intravenous  Airway Management Planned: LMA  Additional Equipment:   Intra-op Plan:   Post-operative Plan: Extubation in OR  Informed Consent: I have reviewed the patients History and Physical, chart, labs and discussed the procedure including the risks, benefits and alternatives for the proposed anesthesia with the patient or authorized representative who has indicated his/her understanding and acceptance.   Dental advisory given  Plan Discussed with: Anesthesiologist, CRNA and Surgeon  Anesthesia Plan Comments:         Anesthesia Quick Evaluation

## 2016-02-04 NOTE — Progress Notes (Signed)
Please let patient know margins are negative and no invasive cancer found.

## 2016-02-10 HISTORY — PX: BREAST LUMPECTOMY: SHX2

## 2016-02-24 NOTE — Assessment & Plan Note (Signed)
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 Breast conserving surgery: 01/30/16: ADH 2. Followed by adjuvant radiation therapy 3. Followed by antiestrogen therapy with tamoxifen 5 years   Return to clinic after XRT to start Tamoxifen

## 2016-02-25 ENCOUNTER — Ambulatory Visit (HOSPITAL_BASED_OUTPATIENT_CLINIC_OR_DEPARTMENT_OTHER): Payer: BLUE CROSS/BLUE SHIELD | Admitting: Hematology and Oncology

## 2016-02-25 DIAGNOSIS — D0512 Intraductal carcinoma in situ of left breast: Secondary | ICD-10-CM | POA: Diagnosis not present

## 2016-02-25 DIAGNOSIS — Z803 Family history of malignant neoplasm of breast: Secondary | ICD-10-CM | POA: Diagnosis not present

## 2016-02-25 MED ORDER — TAMOXIFEN CITRATE 20 MG PO TABS: 20.0000 mg | ORAL_TABLET | Freq: Every day | ORAL | 3 refills | Status: DC

## 2016-02-25 NOTE — Progress Notes (Signed)
Patient Care Team: Monico Blitz, [name], MD (Gastroenterology)  DIAGNOSIS:  Encounter Diagnosis  Name Primary?  . Ductal carcinoma in situ (DCIS) of left breast     SUMMARY OF ONCOLOGIC HISTORY:   Ductal carcinoma in situ (DCIS) of left breast   12/31/2015 Initial Diagnosis    Left breast [name] biopsy: DCIS with calcifications ER 90%, PR -50%, high-grade      01/30/2016 Surgery    Left Lumpectomy: No residual DCIS, fibrocystic changes with UDH       CHIEF COMPLIANT: follow-up to discuss recent left lumpectomy  INTERVAL HISTORY: Kerri Shah is a 56 year old with above-mentioned history left breast DCIS high-grade disease with underwent lumpectomy on 01/30/2016. There was no evidence of residual DCIS. There was no fibrocystic changes and usual ductal hyperplasia. She has recovered very well from the surgery and is not in any pain or discomfort currently.  REVIEW OF SYSTEMS:   Constitutional: Denies fevers, chills or abnormal weight loss Eyes: Denies blurriness of vision Ears, nose, mouth, throat, and face: Denies mucositis or sore throat Respiratory: Denies cough, dyspnea or wheezes Cardiovascular: Denies palpitation, chest discomfort Gastrointestinal:  Denies nausea, heartburn or change in bowel habits Skin: Denies abnormal skin rashes Lymphatics: Denies new lymphadenopathy or easy bruising Neurological:Denies numbness, tingling or new weaknesses Behavioral/Psych: Mood is stable, no new changes  Extremities: No lower extremity edema Breast:  Recent left lumpectomy All other systems were reviewed with the patient and are negative.  I have reviewed the past medical history, past surgical history, social history and family history with the patient and they are unchanged from previous note.  ALLERGIES:  has No Known Allergies.  MEDICATIONS:  Current Outpatient Prescriptions  Medication Sig Dispense Refill  . metFORMIN  (GLUCOPHAGE-XR) 500 MG 24 hr tablet Take 1,000 mg by mouth daily.  1  . Multiple Vitamin (MULTIVITAMIN) capsule Take 1 capsule by mouth daily.      Marland Kitchen omeprazole (PRILOSEC) 20 MG capsule TAKE 1 CAPSULE DAILY. 30 capsule 5  . oxyCODONE (OXY IR/ROXICODONE) 5 MG immediate release tablet Take 1-2 tablets (5-10 mg total) by mouth every 6 (six) hours as needed for moderate pain, severe pain or breakthrough pain. 20 tablet 0  . pravastatin (PRAVACHOL) 40 MG tablet Take 40 mg by mouth daily.  3   No current facility-administered medications for this visit.     PHYSICAL EXAMINATION: ECOG PERFORMANCE STATUS: 1 - Symptomatic but completely ambulatory  Vitals:   02/25/16 1440  BP: (!) 157/85  Pulse: 86  Resp: 18  Temp: 97.9 F (36.6 C)   Filed Weights   02/25/16 1440  Weight: 178 lb 3.2 oz (80.8 kg)    GENERAL:alert, no distress and comfortable SKIN: skin color, texture, turgor are normal, no rashes or significant lesions EYES: normal, Conjunctiva are pink and non-injected, sclera clear OROPHARYNX:no exudate, no erythema and lips, buccal mucosa, and tongue normal  NECK: supple, thyroid normal size, non-tender, without nodularity LYMPH:  no palpable lymphadenopathy in the cervical, axillary or inguinal LUNGS: clear to auscultation and percussion with normal breathing effort HEART: regular rate & rhythm and no murmurs and no lower extremity edema ABDOMEN:abdomen soft, non-tender and normal bowel sounds MUSCULOSKELETAL:no cyanosis of digits and no clubbing  NEURO: alert & oriented x 3 with fluent speech, no focal motor/sensory deficits EXTREMITIES: No lower extremity edema  LABORATORY DATA:  I have reviewed the data as listed   Chemistry      Component  Value Date/Time   NA 142 01/27/2016 1504   K 3.9 01/27/2016 1504   CL 105 01/27/2016 1504   CO2 28 01/27/2016 1504   BUN 7 01/27/2016 1504   BUN 9 10/30/2010 1638   CREATININE 0.85 01/27/2016 1504   CREATININE 0.78 10/30/2010 1638       Component Value Date/Time   CALCIUM 10.3 01/27/2016 1504   CALCIUM 9.3 10/30/2010 1638   ALKPHOS 59 10/30/2010 1638   AST 18 10/30/2010 1638   ALT 27 10/30/2010 1638   BILITOT 0.4 10/30/2010 1638       No results found for: WBC, HGB, HCT, MCV, PLT, NEUTROABS  ASSESSMENT & PLAN:  Ductal carcinoma in situ (DCIS) of left breast Left breast [name] biopsy 12/31/2015: 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 Breast conserving surgery: 01/30/16: ADH 2. Followed by adjuvant radiation therapy 3. Followed by antiestrogen therapy with tamoxifen 5 years  Tamoxifen Counseling: We discussed the risks and benefits of tamoxifen. These include but not limited to insomnia, hot flashes, mood changes, vaginal dryness, and weight gain. Although rare, serious side effects including endometrial cancer, risk of blood clots were also discussed. We strongly believe that the benefits far outweigh the risks. Patient understands these risks and consented to starting treatment. Planned treatment duration is 5 years.  Patient will start tamoxifen at on March 20. I would like to see the patient one month after starting tamoxifen approximately April 20 or so.  Patient has had 3 family members with breast cancer on her mother's side. I will refer her for genetic testing and counseling. Because she lives in [place] she will get radiation at [place]. I will coordinate her genetic counseling appointment around the same time I plan to see her in April.  I spent 25 minutes talking to the patient of which more than half was spent in counseling and coordination of care.  No orders of the defined types were placed in this encounter.  The patient has a good understanding of the overall plan. she agrees with it. she will call with any problems that may develop before the next visit here.   Rulon Eisenmenger, MD 02/25/16

## 2016-02-28 ENCOUNTER — Encounter: Payer: Self-pay | Admitting: *Deleted

## 2016-04-09 DIAGNOSIS — Z923 Personal history of irradiation: Secondary | ICD-10-CM

## 2016-04-09 HISTORY — DX: Personal history of irradiation: Z92.3

## 2016-05-27 ENCOUNTER — Encounter: Payer: BLUE CROSS/BLUE SHIELD | Admitting: Genetic Counselor

## 2016-05-27 ENCOUNTER — Other Ambulatory Visit: Payer: BLUE CROSS/BLUE SHIELD

## 2016-05-27 ENCOUNTER — Encounter: Payer: Self-pay | Admitting: Hematology and Oncology

## 2016-05-27 ENCOUNTER — Telehealth: Payer: Self-pay | Admitting: Hematology and Oncology

## 2016-05-27 ENCOUNTER — Ambulatory Visit (HOSPITAL_BASED_OUTPATIENT_CLINIC_OR_DEPARTMENT_OTHER): Payer: BLUE CROSS/BLUE SHIELD | Admitting: Hematology and Oncology

## 2016-05-27 DIAGNOSIS — D0512 Intraductal carcinoma in situ of left breast: Secondary | ICD-10-CM

## 2016-05-27 DIAGNOSIS — N951 Menopausal and female climacteric states: Secondary | ICD-10-CM

## 2016-05-27 NOTE — Assessment & Plan Note (Signed)
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 Breast conserving surgery: 01/30/16: ADH 2. Followed by adjuvant radiation therapy 3. Followed by antiestrogen therapy with tamoxifen 5 years started 04/28/2016  Tamoxifen toxicities:  Genetic counseling: Being done today Return to clinic in 6 months for follow-up

## 2016-05-27 NOTE — Progress Notes (Signed)
Patient Care Team: Monico Blitz, [name], MD (Gastroenterology)  DIAGNOSIS:  Encounter Diagnosis  Name Primary?  . Ductal carcinoma in situ (DCIS) of left breast     SUMMARY OF ONCOLOGIC HISTORY:   Ductal carcinoma in situ (DCIS) of left breast   12/31/2015 Initial Diagnosis    Left breast [name] biopsy: DCIS with calcifications ER 90%, PR -50%, high-grade      01/30/2016 Surgery    Left Lumpectomy: No residual DCIS, fibrocystic changes with UDH      02/27/2016 - 04/16/2016 Radiation Therapy    Adjuvant radiation therapy in [facility]      04/28/2016 -  Anti-estrogen oral therapy    Tamoxifen 20 mg daily 5 years       CHIEF COMPLIANT: Follow-up on tamoxifen therapy  INTERVAL HISTORY: Kerri Shah is a 56 year old with above-mentioned history of left breast DCIS 1 red lumpectomy and radiation is currently on tamoxifen therapy. She has been on it for the past 1 month and reports that after the first couple of weeks she is done very well and has tolerated the treatment. She does have occasional hot flashes but they're not unbearable. Denies any significant myalgias although she occasionally has leg cramps.  REVIEW OF SYSTEMS:   Constitutional: Denies fevers, chills or abnormal weight loss Eyes: Denies blurriness of vision Ears, nose, mouth, throat, and face: Denies mucositis or sore throat Respiratory: Denies cough, dyspnea or wheezes Cardiovascular: Denies palpitation, chest discomfort Gastrointestinal:  Denies nausea, heartburn or change in bowel habits Skin: Denies abnormal skin rashes Lymphatics: Denies new lymphadenopathy or easy bruising Neurological:Denies numbness, tingling or new weaknesses Behavioral/Psych: Mood is stable, no new changes  Extremities: No lower extremity edema Breast:  denies any pain or lumps or nodules in either breasts All other systems were reviewed with the patient and are negative.  I have  reviewed the past medical history, past surgical history, social history and family history with the patient and they are unchanged from previous note.  ALLERGIES:  has No Known Allergies.  MEDICATIONS:  Current Outpatient Prescriptions  Medication Sig Dispense Refill  . metFORMIN (GLUCOPHAGE-XR) 500 MG 24 hr tablet Take 1,000 mg by mouth daily.  1  . Multiple Vitamin (MULTIVITAMIN) capsule Take 1 capsule by mouth daily.      Marland Kitchen omeprazole (PRILOSEC) 20 MG capsule TAKE 1 CAPSULE DAILY. 30 capsule 5  . oxyCODONE (OXY IR/ROXICODONE) 5 MG immediate release tablet Take 1-2 tablets (5-10 mg total) by mouth every 6 (six) hours as needed for moderate pain, severe pain or breakthrough pain. 20 tablet 0  . pravastatin (PRAVACHOL) 40 MG tablet Take 40 mg by mouth daily.  3  . tamoxifen (NOLVADEX) 20 MG tablet Take 1 tablet (20 mg total) by mouth daily. 90 tablet 3   No current facility-administered medications for this visit.     PHYSICAL EXAMINATION: ECOG PERFORMANCE STATUS: 1 - Symptomatic but completely ambulatory  Vitals:   05/27/16 0953  BP: (!) 175/84  Pulse: 88  Resp: 18  Temp: 98 F (36.7 C)   Filed Weights   05/27/16 0953  Weight: 177 lb 8 oz (80.5 kg)    GENERAL:alert, no distress and comfortable SKIN: skin color, texture, turgor are normal, no rashes or significant lesions EYES: normal, Conjunctiva are pink and non-injected, sclera clear OROPHARYNX:no exudate, no erythema and lips, buccal mucosa, and tongue normal  NECK: supple, thyroid normal size, non-tender, without nodularity LYMPH:  no palpable lymphadenopathy  in the cervical, axillary or inguinal LUNGS: clear to auscultation and percussion with normal breathing effort HEART: regular rate & rhythm and no murmurs and no lower extremity edema ABDOMEN:abdomen soft, non-tender and normal bowel sounds MUSCULOSKELETAL:no cyanosis of digits and no clubbing  NEURO: alert & oriented x 3 with fluent speech, no focal  motor/sensory deficits EXTREMITIES: No lower extremity edema  LABORATORY DATA:  I have reviewed the data as listed   Chemistry      Component Value Date/Time   NA 142 01/27/2016 1504   K 3.9 01/27/2016 1504   CL 105 01/27/2016 1504   CO2 28 01/27/2016 1504   BUN 7 01/27/2016 1504   BUN 9 10/30/2010 1638   CREATININE 0.85 01/27/2016 1504   CREATININE 0.78 10/30/2010 1638      Component Value Date/Time   CALCIUM 10.3 01/27/2016 1504   CALCIUM 9.3 10/30/2010 1638   ALKPHOS 59 10/30/2010 1638   AST 18 10/30/2010 1638   ALT 27 10/30/2010 1638   BILITOT 0.4 10/30/2010 1638     ASSESSMENT & PLAN:  Ductal carcinoma in situ (DCIS) of left breast Left breast [name] biopsy 12/31/2015: 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 Breast conserving surgery: 01/30/16: ADH 2. Followed by adjuvant radiation therapy 3. Followed by antiestrogen therapy with tamoxifen 5 years started 04/28/2016  Tamoxifen toxicities: 1. Occasional hot flashes 2. Myalgias  Genetic counseling: Being done today Return to clinic in 6 months for follow-up  I spent 15 minutes talking to the patient of which more than half was spent in counseling and coordination of care.  No orders of the defined types were placed in this encounter.  The patient has a good understanding of the overall plan. she agrees with it. she will call with any problems that may develop before the next visit here.   Rulon Eisenmenger, MD 05/27/16

## 2016-05-27 NOTE — Telephone Encounter (Signed)
Gave patient avs report and appointments for April 2019.  °

## 2016-05-28 ENCOUNTER — Encounter: Payer: Self-pay | Admitting: Gastroenterology

## 2016-05-28 ENCOUNTER — Other Ambulatory Visit: Payer: Self-pay

## 2016-05-28 ENCOUNTER — Ambulatory Visit (INDEPENDENT_AMBULATORY_CARE_PROVIDER_SITE_OTHER): Payer: BLUE CROSS/BLUE SHIELD | Admitting: Gastroenterology

## 2016-05-28 DIAGNOSIS — R1011 Right upper quadrant pain: Secondary | ICD-10-CM | POA: Diagnosis not present

## 2016-05-28 DIAGNOSIS — K921 Melena: Secondary | ICD-10-CM

## 2016-05-28 DIAGNOSIS — K760 Fatty (change of) liver, not elsewhere classified: Secondary | ICD-10-CM

## 2016-05-28 DIAGNOSIS — Z800 Family history of malignant neoplasm of digestive organs: Secondary | ICD-10-CM

## 2016-05-28 DIAGNOSIS — Z1211 Encounter for screening for malignant neoplasm of colon: Secondary | ICD-10-CM

## 2016-05-28 LAB — LIPASE: LIPASE: 12 U/L (ref 7–60)

## 2016-05-28 MED ORDER — NA SULFATE-K SULFATE-MG SULF 17.5-3.13-1.6 GM/177ML PO SOLN: 1.0000 | ORAL | 0 refills | Status: DC

## 2016-05-28 NOTE — Patient Instructions (Addendum)
  COMPLETE LABS AND ULTRASOUND WITHIN 7 DAYS.  COLONOSCOPY IN MAY 2018. FULL LIQUIDS STARTING WITH BREAKFAST ON DAY PRIOR TO TCS.  CONTINUE GLUCOPHAGE.   DRINK WATER TO KEEP YOUR URINE LIGHT YELLOW.  YOU SHOULD TRANSITION TO A PLANT BASED DIET-NO MEAT OR DAIRY FOR 6 MOS. AVOID ITEMS THAT CAUSE BLOATING & GAS. I RECOMMEND THE BOOK, "PREVENT AND REVERSE HEART DISEASE". CALDWELL ESSELTYN JR., MD. PAGE 120-121 CLEARLY STATE THE RULES AND QUICK AND EASY RECIPES FOR BREAKFAST, LUNCH, AND DINNER ARE AFTER P. 127.  AVOID REFLUX TRIGGERS. SEE INFO BELOW.  CONTINUE OMEPRAZOLE.  TAKE 30 MINUTES PRIOR TO YOUR FIRST MEAL EVERY DAY.  FOLLOW UP IN 4 MOS.     Lifestyle and home remedies TO MANAGE REFLUX/CHEST PAIN  You may eliminate or reduce the frequency of heartburn by making the following lifestyle changes:  . Control your weight. Being overweight is a major risk factor for heartburn and GERD. Excess pounds put pressure on your abdomen, pushing up your stomach and causing acid to back up into your esophagus.   . Eat smaller meals. 4 TO 6 MEALS A DAY. This reduces pressure on the lower esophageal sphincter, helping to prevent the valve from opening and acid from washing back into your esophagus.   Dolphus Jenny your belt. Clothes that fit tightly around your waist put pressure on your abdomen and the lower esophageal sphincter.   . Eliminate heartburn triggers. Everyone has specific triggers. Common triggers such as fatty or fried foods, spicy food, tomato sauce, carbonated beverages, alcohol, chocolate, mint, garlic, onion, caffeine and nicotine may make heartburn worse.   Marland Kitchen Avoid stooping or bending. Tying your shoes is OK. Bending over for longer periods to weed your garden isn't, especially soon after eating.   . Don't lie down after a meal. Wait at least three to four hours after eating before going to bed, and don't lie down right after eating.   Marland Kitchen PUT THE HEAD OF YOUR BED ON 6 INCH  BLOCKS.   Alternative medicine . Several home remedies exist for treating GERD, but they provide only temporary relief. They include drinking baking soda (sodium bicarbonate) added to water or drinking other fluids such as baking soda mixed with cream of tartar and water.  . Although these liquids create temporary relief by neutralizing, washing away or buffering acids, eventually they aggravate the situation by adding gas and fluid to your stomach, increasing pressure and causing more acid reflux. Further, adding more sodium to your diet may increase your blood pressure and add stress to your heart, and excessive bicarbonate ingestion can alter the acid-base balance in your body.

## 2016-05-28 NOTE — Progress Notes (Signed)
Subjective:    Patient ID: Kerri Shah, female    DOB: Aug 16, 1960, 56 y.o.   MRN: [id number]  [facility], MD  HPI FEELS TUGGING AND PULLING IN RUQ. GETS SICK ON HER STOMACH IN THE MIDDLE.ALWAYS HAD TROUBLE WITH CONSTIPATION AND HEMORRHOIDS. STOOLS: MAY HAVE A 1-3, #5 RARE #5. USING ASA/NSAIDs. TAKING OMEPRAZOLE INTERMITTENTLY. NAUSEA: ESPECIALLY WITH ACID REFLUX. MAY HAVE PAIN IN EPIGASTRIUM AND RUQ(ACHY). MAY THROW UP WITH ANESTHESIA.   PT DENIES FEVER, CHILLS, HEMATOCHEZIA, HEMATEMESIS, vomiting, melena, diarrhea, CHEST PAIN, SHORTNESS OF BREATH,  CHANGE IN BOWEL IN HABITS, OR problems SWALLOWING.  Past Medical History:  Diagnosis Date  . Complication of anesthesia   . DM (diabetes mellitus) ([place])    not on any medications  . Fatty liver   . GERD (gastroesophageal reflux disease)   . Hypercholesteremia   . Osteoarthritis    bilateral knees and Left shoulder  . PONV (postoperative nausea and vomiting)   . Vitamin D deficiency    Past Surgical History:  Procedure Laterality Date  . ABDOMINAL HYSTERECTOMY    . BREAST LUMPECTOMY WITH RADIOACTIVE SEED LOCALIZATION Left 01/30/2016   Procedure: BREAST LUMPECTOMY WITH RADIOACTIVE SEED LOCALIZATION;  Surgeon: [name], MD;  Location: [place];  Service: General;  Laterality: Left;  . COLONOSCOPY  12/23/10   internal hemorrhoids/polyps in the recto-sigmoid colon, hyperplastic, TCS IN 5 years  . PARTIAL HYSTERECTOMY  2010   uterine fibroids  . TUBAL LIGATION    . UPPER GASTROINTESTINAL ENDOSCOPY  Nov 2012   mild gastritis, patent esophageal ring s/p forceps dilation, chronic gastritis on path   No Known Allergies  Current Outpatient Prescriptions  Medication Sig Dispense Refill  . metFORMIN (GLUCOPHAGE-XR) 500 MG 24 hr tablet Take 1,000 mg by mouth daily.    . Multiple Vitamin (MULTIVITAMIN) capsule Take 1 capsule by mouth daily.      Marland Kitchen omeprazole (PRILOSEC) 20 MG capsule TAKE 1 CAPSULE DAILY.    .  pravastatin (PRAVACHOL) 40 MG tablet Take 40 mg by mouth daily.    . tamoxifen (NOLVADEX) 20 MG tablet Take 1 tablet (20 mg total) by mouth daily.    .        Family History  Problem Relation Age of Onset  . Colon cancer Mother 53  . Colon cancer Brother 58  . Diabetes Brother    Social History   Social History  . Marital status: Married    Spouse name: N/A  . Number of children: 2  . Years of education: N/A   Occupational History  . 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 Smoking status: Never Smoker  . Smokeless tobacco: Never Used  . Alcohol use No  . Drug use: No  . Sexual activity: Not Asked   WAS A NURSES AIDE. MEDS VIA [employer].  Review of Systems PER HPI OTHERWISE ALL SYSTEMS ARE NEGATIVE.    Objective:   Physical Exam  Constitutional: She is oriented to person, place, and time. She appears well-developed and well-nourished. No distress.  HENT:  Head: Normocephalic and atraumatic.  Mouth/Throat: Oropharynx is clear and moist. No oropharyngeal exudate.  Eyes: Pupils are equal, round, and reactive to light. No scleral icterus.  Neck: Normal range of motion. Neck supple.  Cardiovascular: Normal rate, regular rhythm and normal heart sounds.   Pulmonary/Chest: Effort normal and breath sounds normal. No respiratory distress.  Abdominal: Soft. Bowel sounds are normal. She exhibits no distension. There is no tenderness.  Musculoskeletal:  She exhibits no edema.  Lymphadenopathy:    She has no cervical adenopathy.  Neurological: She is alert and oriented to person, place, and time.  NO FOCAL DEFICITS  Psychiatric:  SLIGHTLY ANXIOUS MOOD, NL AFFECT  Vitals reviewed.     Assessment & Plan:

## 2016-05-28 NOTE — Assessment & Plan Note (Signed)
WEIGHT DOWN 10 LBS SINCE 2012. TREATED FOR BREAST CANCER SINCE LAST VISIT.   COMPLETE LABS AND ULTRASOUND WITHIN 7 DAYS. DRINK WATER TO KEEP YOUR URINE LIGHT YELLOW. YOU SHOULD TRANSITION TO A PLANT BASED DIET-NO MEAT OR DAIRY FOR 6 MOS. AVOID ITEMS THAT CAUSE BLOATING & GAS. I RECOMMEND THE BOOK, "PREVENT AND REVERSE HEART DISEASE". CALDWELL ESSELTYN JR., MD. PAGE 120-121 CLEARLY STATE THE RULES AND QUICK AND EASY RECIPES FOR BREAKFAST, LUNCH, AND DINNER ARE AFTER P. 127. FOLLOW UP IN 4 MOS.

## 2016-05-28 NOTE — Assessment & Plan Note (Signed)
ASSOCIATED WITH NAUSEA.REFLUX AND NSAID USE W/O PPI. DIFFERENTIAL DIAGNOSIS INCLUDES: NSAID GASTRITIS, UNCONTROLLED GERD, LESS LIKELY PANCREATIC CA OR LIVER METS. LAST U/S: 2012   COMPLETE LABS AND ULTRASOUND WITHIN 7 DAYS. DRINK WATER TO KEEP YOUR URINE LIGHT YELLOW. YOU SHOULD TRANSITION TO A PLANT BASED DIET-NO MEAT OR DAIRY FOR 6 MOS. AVOID ITEMS THAT CAUSE BLOATING & GAS. I RECOMMEND THE BOOK, "PREVENT AND REVERSE HEART DISEASE". CALDWELL ESSELTYN JR., MD. PAGE 120-121 CLEARLY STATE THE RULES AND QUICK AND EASY RECIPES FOR BREAKFAST, LUNCH, AND DINNER ARE AFTER P. 127. AVOID REFLUX TRIGGERS.  HANDOUT GIVEN. CONTINUE OMEPRAZOLE.  TAKE 30 MINUTES PRIOR TO YOUR FIRST MEAL EVERY DAY.  FOLLOW UP IN 4 MOS.

## 2016-05-28 NOTE — Assessment & Plan Note (Signed)
PAST DUE TO TCS. SHOULD'VE HAD ONE IN NOV 2017. HAS NAUSEA AFTER ANESTHESIA.   COLONOSCOPY IN MAY 2018. FULL LIQUIDS STARTING WITH BREAKFAST ON DAY PRIOR TO TCS.  CONTINUE GLUCOPHAGE. DISCUSSED PROCEDURE, BENEFITS, & RISKS: < 1% chance of medication reaction, bleeding, perforation, or rupture of spleen/liver. ZOFRAN 4 MG IV IN PREOP.

## 2016-05-28 NOTE — Progress Notes (Signed)
ON RECALL  °

## 2016-05-28 NOTE — Assessment & Plan Note (Signed)
SYMPTOMS CONTROLLED/RESOLVED.  CONTINUE TO MONITOR SYMPTOMS. 

## 2016-05-28 NOTE — Progress Notes (Signed)
cc'ed to pcp °

## 2016-05-29 ENCOUNTER — Telehealth: Payer: Self-pay

## 2016-05-29 NOTE — Telephone Encounter (Signed)
Doris, can you please triage for TCS with [facility] scheduled for 06/29/16. Orders have been entered.

## 2016-06-01 ENCOUNTER — Telehealth: Payer: Self-pay

## 2016-06-01 NOTE — Telephone Encounter (Signed)
Received fax from [facility] last week that Suprep was rejected. Called [facility] and her insurance also doesn't cover Moviprep. Tried to call pt, LMOAM and informed her that she can pick-up Moviprep sample and new instructions at our office.

## 2016-06-01 NOTE — Telephone Encounter (Signed)
Routing to Doris 

## 2016-06-01 NOTE — Telephone Encounter (Signed)
Noted  

## 2016-06-04 ENCOUNTER — Ambulatory Visit (HOSPITAL_COMMUNITY)
Admission: RE | Admit: 2016-06-04 | Discharge: 2016-06-04 | Disposition: A | Discharge status: Home / Self Care | Payer: BLUE CROSS/BLUE SHIELD | Source: Ambulatory Visit | Attending: Gastroenterology | Admitting: Gastroenterology

## 2016-06-04 DIAGNOSIS — K760 Fatty (change of) liver, not elsewhere classified: Secondary | ICD-10-CM

## 2016-06-04 DIAGNOSIS — K8020 Calculus of gallbladder without cholecystitis without obstruction: Secondary | ICD-10-CM | POA: Diagnosis not present

## 2016-06-04 DIAGNOSIS — R1011 Right upper quadrant pain: Secondary | ICD-10-CM | POA: Diagnosis not present

## 2016-06-04 NOTE — Telephone Encounter (Signed)
Called pt. She got the message. She will come by office next week to pick-up sample Moviprep.

## 2016-06-11 ENCOUNTER — Other Ambulatory Visit: Payer: Self-pay

## 2016-06-11 ENCOUNTER — Encounter: Payer: BLUE CROSS/BLUE SHIELD | Admitting: Genetics

## 2016-06-18 ENCOUNTER — Telehealth: Payer: Self-pay | Admitting: Gastroenterology

## 2016-06-18 NOTE — Telephone Encounter (Signed)
PLEASE CALL PT. HER LIPASE IS NL. HER US SHOWS GALLSTONES AND FATTY LIVER. IF SHE HAS RUQ PAIN 4-6 HRS AFTER EATING THEN SHE CALL ME AND SHE SHOULD CONSIDER SEEING SURGERY TO REMOVE HER GALLBLADDER.

## 2016-06-22 NOTE — Telephone Encounter (Signed)
PT is aware. Is scheduled for her colonoscopy on 06/29/2016. She will think about the referral to surgeon and let us know.

## 2016-06-29 ENCOUNTER — Encounter (HOSPITAL_COMMUNITY)
Admission: RE | Disposition: A | Discharge status: Home / Self Care | Payer: Self-pay | Source: Ambulatory Visit | Attending: Gastroenterology

## 2016-06-29 ENCOUNTER — Ambulatory Visit (HOSPITAL_COMMUNITY)
Admission: RE | Admit: 2016-06-29 | Discharge: 2016-06-29 | Disposition: A | Discharge status: Home / Self Care | Payer: BLUE CROSS/BLUE SHIELD | Source: Ambulatory Visit | Attending: Gastroenterology | Admitting: Gastroenterology

## 2016-06-29 ENCOUNTER — Encounter (HOSPITAL_COMMUNITY): Payer: Self-pay | Admitting: *Deleted

## 2016-06-29 DIAGNOSIS — Z1211 Encounter for screening for malignant neoplasm of colon: Secondary | ICD-10-CM | POA: Diagnosis not present

## 2016-06-29 DIAGNOSIS — Z800 Family history of malignant neoplasm of digestive organs: Secondary | ICD-10-CM | POA: Insufficient documentation

## 2016-06-29 DIAGNOSIS — Z1212 Encounter for screening for malignant neoplasm of rectum: Secondary | ICD-10-CM | POA: Diagnosis not present

## 2016-06-29 DIAGNOSIS — K5730 Diverticulosis of large intestine without perforation or abscess without bleeding: Secondary | ICD-10-CM | POA: Insufficient documentation

## 2016-06-29 DIAGNOSIS — E119 Type 2 diabetes mellitus without complications: Secondary | ICD-10-CM | POA: Insufficient documentation

## 2016-06-29 DIAGNOSIS — K621 Rectal polyp: Secondary | ICD-10-CM | POA: Insufficient documentation

## 2016-06-29 DIAGNOSIS — Q438 Other specified congenital malformations of intestine: Secondary | ICD-10-CM | POA: Diagnosis not present

## 2016-06-29 DIAGNOSIS — K644 Residual hemorrhoidal skin tags: Secondary | ICD-10-CM | POA: Diagnosis not present

## 2016-06-29 DIAGNOSIS — Z7984 Long term (current) use of oral hypoglycemic drugs: Secondary | ICD-10-CM | POA: Insufficient documentation

## 2016-06-29 DIAGNOSIS — Z79899 Other long term (current) drug therapy: Secondary | ICD-10-CM | POA: Diagnosis not present

## 2016-06-29 DIAGNOSIS — K648 Other hemorrhoids: Secondary | ICD-10-CM | POA: Insufficient documentation

## 2016-06-29 DIAGNOSIS — D123 Benign neoplasm of transverse colon: Secondary | ICD-10-CM | POA: Insufficient documentation

## 2016-06-29 DIAGNOSIS — E7800 Pure hypercholesterolemia, unspecified: Secondary | ICD-10-CM | POA: Insufficient documentation

## 2016-06-29 DIAGNOSIS — K219 Gastro-esophageal reflux disease without esophagitis: Secondary | ICD-10-CM | POA: Diagnosis not present

## 2016-06-29 HISTORY — PX: COLONOSCOPY: SHX5424

## 2016-06-29 LAB — GLUCOSE, CAPILLARY: GLUCOSE-CAPILLARY: 92 mg/dL (ref 65–99)

## 2016-06-29 SURGERY — COLONOSCOPY
Anesthesia: Moderate Sedation

## 2016-06-29 MED ORDER — STERILE WATER FOR IRRIGATION IR SOLN
Status: DC | PRN
  Administered 2016-06-29: 12:00:00

## 2016-06-29 MED ORDER — MIDAZOLAM HCL 5 MG/5ML IJ SOLN
INTRAMUSCULAR | Status: DC | PRN
  Administered 2016-06-29: 1 mg via INTRAVENOUS
  Administered 2016-06-29 (×2): 2 mg via INTRAVENOUS

## 2016-06-29 MED ORDER — SODIUM CHLORIDE 0.9 % IV SOLN
INTRAVENOUS | Status: DC
  Administered 2016-06-29: 10:00:00 via INTRAVENOUS

## 2016-06-29 MED ORDER — MEPERIDINE HCL 100 MG/ML IJ SOLN
INTRAMUSCULAR | Status: DC | PRN
  Administered 2016-06-29: 50 mg via INTRAVENOUS
  Administered 2016-06-29 (×2): 25 mg via INTRAVENOUS

## 2016-06-29 MED ORDER — MEPERIDINE HCL 100 MG/ML IJ SOLN
INTRAMUSCULAR | Status: DC
Start: 2016-06-29 — End: 2016-06-29
  Filled 2016-06-29: qty 2

## 2016-06-29 MED ORDER — ONDANSETRON HCL 4 MG/2ML IJ SOLN
INTRAMUSCULAR | Status: AC
  Filled 2016-06-29: qty 2

## 2016-06-29 MED ORDER — ONDANSETRON HCL 4 MG/2ML IJ SOLN
4.0000 mg | Freq: Once | INTRAMUSCULAR | Status: AC
  Administered 2016-06-29: 4 mg via INTRAVENOUS

## 2016-06-29 MED ORDER — MIDAZOLAM HCL 5 MG/5ML IJ SOLN
INTRAMUSCULAR | Status: DC
Start: 2016-06-29 — End: 2016-06-29
  Filled 2016-06-29: qty 10

## 2016-06-29 NOTE — Discharge Instructions (Signed)
You have small internal hemorrhoids, WHICH CAN CAUSE FOR RECTAL BLEEDING. YOU HAVE diverticulosis IN YOUR LEFT COLON. YOU HAD TWO SMALL POLYPS REMOVED.    DRINK WATER TO KEEP YOUR URINE LIGHT YELLOW.  FOLLOW A HIGH FIBER DIET. AVOID ITEMS THAT CAUSE BLOATING. See info below.  USE PREPARATION H FOUR TIMES  A DAY IF NEEDED TO RELIEVE RECTAL PAIN/PRESSURE/BLEEDING.  YOUR BIOPSY RESULTS WILL BE AVAILABLE IN MY CHART  MAY 24 AND MY OFFICE WILL CONTACT YOU IN 10-14 DAYS WITH YOUR RESULTS.   Next colonoscopy in 5-10 years.  Colonoscopy Care After Read the instructions outlined below and refer to this sheet in the next week. These discharge instructions provide you with general information on caring for yourself after you leave the hospital. While your treatment has been planned according to the most current medical practices available, unavoidable complications occasionally occur. If you have any problems or questions after discharge, call DR. Shaneisha Burkel, [phone].  ACTIVITY  You may resume your regular activity, but move at a slower pace for the next 24 hours.   Take frequent rest periods for the next 24 hours.   Walking will help get rid of the air and reduce the bloated feeling in your belly (abdomen).   No driving for 24 hours (because of the medicine (anesthesia) used during the test).   You may shower.   Do not sign any important legal documents or operate any machinery for 24 hours (because of the anesthesia used during the test).    NUTRITION  Drink plenty of fluids.   You may resume your normal diet as instructed by your doctor.   Begin with a light meal and progress to your normal diet. Heavy or fried foods are harder to digest and may make you feel sick to your stomach (nauseated).   Avoid alcoholic beverages for 24 hours or as instructed.    MEDICATIONS  You may resume your normal medications.   WHAT YOU CAN EXPECT TODAY  Some feelings of bloating in the  abdomen.   Passage of more gas than usual.   Spotting of blood in your stool or on the toilet paper  .  IF YOU HAD POLYPS REMOVED DURING THE COLONOSCOPY:  Eat a soft diet IF YOU HAVE NAUSEA, BLOATING, ABDOMINAL PAIN, OR VOMITING.    FINDING OUT THE RESULTS OF YOUR TEST Not all test results are available during your visit. DR. Oneida Alar WILL CALL YOU WITHIN 14 DAYS OF YOUR PROCEDUE WITH YOUR RESULTS. Do not assume everything is normal if you have not heard from DR. Anasofia Micallef, CALL HER OFFICE AT [phone].  SEEK IMMEDIATE MEDICAL ATTENTION AND CALL THE OFFICE: [phone] IF:  You have more than a spotting of blood in your stool.   Your belly is swollen (abdominal distention).   You are nauseated or vomiting.   You have a temperature over 101F.   You have abdominal pain or discomfort that is severe or gets worse throughout the day.  Polyps, Colon  A polyp is extra tissue that grows inside your body. Colon polyps grow in the large intestine. The large intestine, also called the colon, is part of your digestive system. It is a long, hollow tube at the end of your digestive tract where your body makes and stores stool. Most polyps are not dangerous. They are benign. This means they are not cancerous. But over time, some types of polyps can turn into cancer. Polyps that are smaller than a pea are usually not harmful. But  larger polyps could someday become or may already be cancerous. To be safe, doctors remove all polyps and test them.   PREVENTION There is not one sure way to prevent polyps. You might be able to lower your risk of getting them if you:  Eat more fruits and vegetables and less fatty food.   Do not smoke.   Avoid alcohol.   Exercise every day.   Lose weight if you are overweight.   Eating more calcium and folate can also lower your risk of getting polyps. Some foods that are rich in calcium are milk, cheese, and broccoli. Some foods that are rich in folate are  chickpeas, kidney beans, and spinach.    Diverticulosis Diverticulosis is a common condition that develops when small pouches (diverticula) form in the wall of the colon. The risk of diverticulosis increases with age. It happens more often in people who eat a low-fiber diet. Most individuals with diverticulosis have no symptoms. Those individuals with symptoms usually experience belly (abdominal) pain, constipation, or loose stools (diarrhea).  HOME CARE INSTRUCTIONS  Increase the amount of fiber in your diet as directed by your caregiver or dietician. This may reduce symptoms of diverticulosis.   Drink at least 6 to 8 glasses of water each day to prevent constipation.   Try not to strain when you have a bowel movement.   Avoiding nuts and seeds to prevent complications is NOT NECESSARY.     FOODS HAVING HIGH FIBER CONTENT INCLUDE:  Fruits. Apple, peach, pear, tangerine, raisins, prunes.   Vegetables. Brussels sprouts, asparagus, broccoli, cabbage, carrot, cauliflower, romaine lettuce, spinach, summer squash, tomato, winter squash, zucchini.   Starchy Vegetables. Baked beans, kidney beans, lima beans, split peas, lentils, potatoes (with skin).   Grains. Whole wheat bread, brown rice, bran flake cereal, plain oatmeal, white rice, shredded wheat, bran muffins.    SEEK IMMEDIATE MEDICAL CARE IF:  You develop increasing pain or severe bloating.   You have an oral temperature above 101F.   You develop vomiting or bowel movements that are bloody or black.   Hemorrhoids Hemorrhoids are dilated (enlarged) veins around the rectum. Sometimes clots will form in the veins. This makes them swollen and painful. These are called thrombosed hemorrhoids. Causes of hemorrhoids include:  Constipation.   Straining to have a bowel movement.   HEAVY LIFTING  HOME CARE INSTRUCTIONS  Eat a well balanced diet and drink 6 to 8 glasses of water every day to avoid constipation. You may also use a  bulk laxative.   Avoid straining to have bowel movements.   Keep anal area dry and clean.   Do not use a donut shaped pillow or sit on the toilet for long periods. This increases blood pooling and pain.   Move your bowels when your body has the urge; this will require less straining and will decrease pain and pressure.

## 2016-06-29 NOTE — Op Note (Signed)
[facility] Patient Name: Kerri Shah Procedure Date: 06/29/2016 11:34 AM MRN: [id number] Date of Birth: 03-Feb-1961 Attending MD: Barney Drain , MD CSN: [id number] Age: 56 Admit Type: Outpatient Procedure:                Colonoscopy WITH COLD FORCEPS POLYPECTOMY Indications:              Family history of colon cancer in multiple                            second-degree relatives.LAST TCS 2012: HYPERPLASTIC                            POLYPS Providers:                Barney Drain, MD, Otis Peak B. Sharon Seller, RN, Randa Spike, Technician Referring MD:             Fuller Canada [name] MD, MD Medicines:                Meperidine 100 mg IV, Midazolam 5 mg IV,                            Ondansetron 4 mg IV Complications:            No immediate complications. Estimated Blood Loss:     Estimated blood loss was minimal. Procedure:                Pre-Anesthesia Assessment:                           - Prior to the procedure, a History and Physical                            was performed, and patient medications and                            allergies were reviewed. The patient's tolerance of                            previous anesthesia was also reviewed. The risks                            and benefits of the procedure and the sedation                            options and risks were discussed with the patient.                            All questions were answered, and informed consent                            was obtained. Prior Anticoagulants: The patient has  taken no previous anticoagulant or antiplatelet                            agents. ASA Grade Assessment: II - A patient with                            mild systemic disease. After reviewing the risks                            and benefits, the patient was deemed in                            satisfactory condition to undergo the procedure.                             After obtaining informed consent, the colonoscope                            was passed under direct vision. Throughout the                            procedure, the patient's blood pressure, pulse, and                            oxygen saturations were monitored continuously. The                            Colonoscope was introduced through the anus and                            advanced to the the cecum, identified by                            appendiceal orifice and ileocecal valve. The                            colonoscopy was technically difficult and complex                            due to restricted mobility of the colon. Successful                            completion of the procedure was aided by increasing                            the dose of sedation medication, straightening and                            shortening the scope to obtain bowel loop reduction                            and COLOWRAP. The patient tolerated the procedure  fairly well. The quality of the bowel preparation                            was excellent. The ileocecal valve, appendiceal                            orifice, and rectum were photographed. Scope In: 12:01:44 PM Scope Out: 12:20:44 PM Scope Withdrawal Time: 0 hours 14 minutes 26 seconds  Total Procedure Duration: 0 hours 19 minutes 0 seconds  Findings:      The digital rectal exam findings include non-thrombosed external       hemorrhoids.      Two sessile polyps were found in the rectum and hepatic flexure. The       polyps were 2 to 3 mm in size. These polyps were removed with a cold       biopsy forceps. Resection and retrieval were complete.      A few small-mouthed diverticula were found in the recto-sigmoid colon       and sigmoid colon.      The recto-sigmoid colon and sigmoid colon were moderately tortuous.      Internal hemorrhoids were found during retroflexion. The hemorrhoids       were small       . Impression:               - Non-thrombosed external hemorrhoids .                           - Two 2 to 3 mm polyps in the rectum and at the                            hepatic flexure, removed with a cold biopsy                            forceps. Resected and retrieved.                           - MILD Diverticulosis in the recto-sigmoid colon                            and in the sigmoid colon.                           - REDUNDANT LEFT COLON                           - SMALL INTERNAL HEMORRHOIDS Moderate Sedation:      Moderate (conscious) sedation was administered by the endoscopy nurse       and supervised by the endoscopist. The following parameters were       monitored: oxygen saturation, heart rate, blood pressure, and response       to care. Total physician intraservice time was 33 minutes. Recommendation:           - High fiber diet and low fat diet.                           - Continue present medications.                           -  Await pathology results.                           - Repeat colonoscopy in 5-10 years for surveillance.                           - CONSIDER SURGERY REFERRAL TO REMOVE GALLBLADDER                            DUE TO BILIARY COLIC/GALLSTONES                           - Patient has a contact number available for                            emergencies. The signs and symptoms of potential                            delayed complications were discussed with the                            patient. Return to normal activities tomorrow.                            Written discharge instructions were provided to the                            patient. Procedure Code(s):        --- Professional ---                           [phone], Colonoscopy, flexible; with biopsy, single                            or multiple                           99152, Moderate sedation services provided by the                            same physician or other qualified health care                             professional performing the diagnostic or                            therapeutic service that the sedation supports,                            requiring the presence of an independent trained                            observer to assist in the monitoring of the                            patient's  level of consciousness and physiological                            status; initial 15 minutes of intraservice time,                            patient age 62 years or older                           [phone], Moderate sedation services; each additional                            15 minutes intraservice time Diagnosis Code(s):        --- Professional ---                           K62.1, Rectal polyp                           D12.3, Benign neoplasm of transverse colon (hepatic                            flexure or splenic flexure)                           K64.4, Residual hemorrhoidal skin tags                           Z80.0, Family history of malignant neoplasm of                            digestive organs                           K57.30, Diverticulosis of large intestine without                            perforation or abscess without bleeding CPT copyright 2016 [employer]. All rights reserved. The codes documented in this report are preliminary and upon coder review may  be revised to meet current compliance requirements. Barney Drain, MD Barney Drain, MD 06/29/2016 12:29:59 PM This report has been signed electronically. Number of Addenda: 0

## 2016-06-29 NOTE — H&P (Signed)
Primary Care Physician:  Monico Blitz, MD Primary Gastroenterologist:  Dr. Oneida Alar  Pre-Procedure History & Physical: HPI:  Kerri Shah is a 56 y.o. female here for FAMILY Hx COLON CA-2 1ST DEGREE RELATIVES HAD COLON CA AGE > 94.  Past Medical History:  Diagnosis Date  . Complication of anesthesia   . DM (diabetes mellitus) ([place])    not on any medications  . Fatty liver   . GERD (gastroesophageal reflux disease)   . Hypercholesteremia   . Osteoarthritis    bilateral knees and Left shoulder  . PONV (postoperative nausea and vomiting)   . Vitamin D deficiency     Past Surgical History:  Procedure Laterality Date  . ABDOMINAL HYSTERECTOMY    . BREAST LUMPECTOMY WITH RADIOACTIVE SEED LOCALIZATION Left 01/30/2016   Procedure: BREAST LUMPECTOMY WITH RADIOACTIVE SEED LOCALIZATION;  Surgeon: [name], MD;  Location: [facility];  Service: General;  Laterality: Left;  . COLONOSCOPY  12/23/10   internal hemorrhoids/polyps in the recto-sigmoid colon, hyperplastic, TCS IN 5 years  . PARTIAL HYSTERECTOMY  2010   uterine fibroids  . TUBAL LIGATION    . UPPER GASTROINTESTINAL ENDOSCOPY  Nov 2012   mild gastritis, patent esophageal ring s/p forceps dilation, chronic gastritis on path    Prior to Admission medications   Medication Sig Start Date End Date Taking? Authorizing Provider  metFORMIN (GLUCOPHAGE-XR) 500 MG 24 hr tablet Take 500 mg by mouth at bedtime.  01/16/16  Yes [provider]  Multiple Vitamin (MULTIVITAMIN) capsule Take 1 capsule by mouth daily.     Yes [provider]  omeprazole (PRILOSEC) 20 MG capsule TAKE 1 CAPSULE DAILY. 08/29/12  Yes Annitta Needs, NP  pravastatin (PRAVACHOL) 40 MG tablet Take 40 mg by mouth at bedtime.  01/13/16  Yes [provider]  tamoxifen (NOLVADEX) 20 MG tablet Take 1 tablet (20 mg total) by mouth daily. 04/28/16  Yes Nicholas Lose, MD    Allergies as of 05/28/2016  . (No Known Allergies)     Family History  Problem Relation Age of Onset  . Colon cancer Mother 41  . Colon cancer Brother 67  . Diabetes Brother     Social History   Social History  . Marital status: Married    Spouse name: N/A  . Number of children: 2  . Years of education: N/A   Occupational History  . 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 Smoking status: Never Smoker  . Smokeless tobacco: Never Used  . Alcohol use No  . Drug use: No  . Sexual activity: Not on file   Other Topics Concern  . Not on file   Social History Narrative  . No narrative on file    Review of Systems: See HPI, otherwise negative ROS   Physical Exam: BP 139/77   Pulse 73   Temp 97.9 F (36.6 C) (Oral)   Resp 16   Ht 5' 6.5" (1.689 m)   Wt 177 lb (80.3 kg)   SpO2 100%   BMI 28.14 kg/m  General:   Alert,  pleasant and cooperative in NAD Head:  Normocephalic and atraumatic. Neck:  Supple; Lungs:  Clear throughout to auscultation.    Heart:  Regular rate and rhythm. Abdomen:  Soft, nontender and nondistended. Normal bowel sounds, without guarding, and without rebound.   Neurologic:  Alert and  oriented x4;  grossly normal neurologically.  Impression/Plan:    FAMILY Hx COLON CA-2 1ST DEGREE  RELATIVES HAD COLON CA AGE > 60.  PLAN: 1. TCS TODAY. DISCUSSED PROCEDURE, BENEFITS, & RISKS: < 1% chance of medication reaction, bleeding, perforation, or rupture of spleen/liver.

## 2016-07-09 ENCOUNTER — Encounter: Payer: Self-pay | Admitting: *Deleted

## 2016-07-10 ENCOUNTER — Encounter (HOSPITAL_COMMUNITY): Payer: Self-pay | Admitting: Gastroenterology

## 2016-07-13 ENCOUNTER — Telehealth: Payer: Self-pay | Admitting: Gastroenterology

## 2016-07-13 NOTE — Telephone Encounter (Signed)
Pt had colonoscopy on 5/21 and was calling to get her results. Please call [phone]

## 2016-07-13 NOTE — Telephone Encounter (Signed)
Reminder in epic °

## 2016-07-13 NOTE — Telephone Encounter (Signed)
Please call pt. She had ONE simple adenoma AND ONE HYPERPLASTIC POLYP removed.   DRINK WATER TO KEEP YOUR URINE LIGHT YELLOW.  FOLLOW A HIGH FIBER DIET. AVOID ITEMS THAT CAUSE BLOATING.  USE PREPARATION H FOUR TIMES  A DAY IF NEEDED TO RELIEVE RECTAL PAIN/PRESSURE/BLEEDING.  Next colonoscopy in 5-10 years.

## 2016-07-13 NOTE — Telephone Encounter (Signed)
Forwarding to Dr. Fields for results.  

## 2016-07-14 NOTE — Telephone Encounter (Signed)
LMOM to call.

## 2016-07-15 NOTE — Telephone Encounter (Signed)
LMOM to call and also printed this info and mailing to pt.

## 2016-07-15 NOTE — Telephone Encounter (Signed)
Pt is aware of results. 

## 2016-08-04 ENCOUNTER — Encounter: Payer: Self-pay | Admitting: Gastroenterology

## 2016-08-05 ENCOUNTER — Other Ambulatory Visit: Payer: Self-pay | Admitting: General Surgery

## 2016-08-05 DIAGNOSIS — N6452 Nipple discharge: Secondary | ICD-10-CM

## 2016-08-07 ENCOUNTER — Ambulatory Visit
Admission: RE | Admit: 2016-08-07 | Discharge: 2016-08-07 | Disposition: A | Discharge status: Home / Self Care | Payer: BLUE CROSS/BLUE SHIELD | Source: Ambulatory Visit | Attending: General Surgery | Admitting: General Surgery

## 2016-08-07 DIAGNOSIS — N6452 Nipple discharge: Secondary | ICD-10-CM

## 2016-08-07 HISTORY — DX: Nipple discharge: N64.52

## 2016-08-07 HISTORY — DX: Personal history of irradiation: Z92.3

## 2016-08-07 HISTORY — DX: Unspecified lump in unspecified breast: N63.0

## 2016-09-17 ENCOUNTER — Encounter: Payer: Self-pay | Admitting: Adult Health

## 2016-09-17 ENCOUNTER — Ambulatory Visit (HOSPITAL_BASED_OUTPATIENT_CLINIC_OR_DEPARTMENT_OTHER): Payer: BLUE CROSS/BLUE SHIELD | Admitting: Adult Health

## 2016-09-17 VITALS — BP 145/73 | HR 85 | Temp 98.1°F | Resp 18 | Ht 66.5 in | Wt 177.4 lb

## 2016-09-17 DIAGNOSIS — D0512 Intraductal carcinoma in situ of left breast: Secondary | ICD-10-CM

## 2016-09-17 DIAGNOSIS — I890 Lymphedema, not elsewhere classified: Secondary | ICD-10-CM

## 2016-09-17 NOTE — Progress Notes (Signed)
CLINIC:  Survivorship   REASON FOR VISIT:  Routine follow-up post-treatment for a recent history of breast cancer.  BRIEF ONCOLOGIC HISTORY:    Ductal carcinoma in situ (DCIS) of left breast   12/31/2015 Initial Diagnosis    Left breast [name] biopsy: DCIS with calcifications ER 90%, PR -50%, high-grade      01/30/2016 Surgery    Left Lumpectomy: No residual DCIS, fibrocystic changes with UDH      02/27/2016 - 04/16/2016 Radiation Therapy    Adjuvant radiation therapy in [facility]      04/28/2016 -  Anti-estrogen oral therapy    Tamoxifen 20 mg daily 5 years       INTERVAL HISTORY:  Kerri Shah presents to the [facility] today for our initial meeting to review her survivorship care plan detailing her treatment course for breast cancer, as well as monitoring long-term side effects of that treatment, education regarding health maintenance, screening, and overall wellness and health promotion.     Overall, Kerri Shah reports feeling quite well.  She is taking Tamoxifen and tolerated it without any difficulty.  She does have lymphedema and sees physical therapy about it this afternoon.  She has a compression sleeve that she is bringing to the appointment.      REVIEW OF SYSTEMS:  Review of Systems  Constitutional: Negative for appetite change, chills, fatigue, fever and unexpected weight change.  HENT:   Negative for hearing loss and lump/mass.   Eyes: Negative for eye problems and icterus.  Respiratory: Negative for chest tightness and cough.   Cardiovascular: Negative for chest pain, leg swelling and palpitations.  Gastrointestinal: Negative for abdominal distention and abdominal pain.  Endocrine: Positive for hot flashes (occasional).  Genitourinary: Negative for difficulty urinating and dyspareunia.   Musculoskeletal: Negative for arthralgias.  Skin: Negative for itching and rash.  Neurological: Negative for dizziness, extremity weakness, headaches and numbness.    Hematological: Negative for adenopathy.  Psychiatric/Behavioral: Negative for depression. The patient is not nervous/anxious.    Breast: Denies any new nodularity, masses, tenderness, nipple changes, or nipple discharge.      ONCOLOGY TREATMENT TEAM:  1. Surgeon:  Dr. Barry Dienes at [facility] [place] 2. Medical Oncologist: Dr. Lindi Adie  3. Radiation Oncologist: Dr. Isidore Moos    PAST MEDICAL/SURGICAL HISTORY:  Past Medical History:  Diagnosis Date  . Breast discharge    lt bloody discharge x's 3 weeks   . Breast mass    lt breast mass x's 3 weeks   . Complication of anesthesia   . DM (diabetes mellitus) ([place])    not on any medications  . Fatty liver   . GERD (gastroesophageal reflux disease)   . Hypercholesteremia   . Osteoarthritis    bilateral knees and Left shoulder  . Personal history of radiation therapy 04/2016   radiation   . PONV (postoperative nausea and vomiting)   . Vitamin D deficiency    Past Surgical History:  Procedure Laterality Date  . ABDOMINAL HYSTERECTOMY    . BREAST LUMPECTOMY WITH RADIOACTIVE SEED LOCALIZATION Left 01/30/2016   Procedure: BREAST LUMPECTOMY WITH RADIOACTIVE SEED LOCALIZATION;  Surgeon: [name], MD;  Location: [facility];  Service: General;  Laterality: Left;  . COLONOSCOPY  12/23/10   internal hemorrhoids/polyps in the recto-sigmoid colon, hyperplastic, TCS IN 5 years  . COLONOSCOPY N/A 06/29/2016   Procedure: COLONOSCOPY;  Surgeon: Danie Binder, MD;  [facility];  Service: Endoscopy;  Laterality: N/A;  10:30am  . PARTIAL HYSTERECTOMY  2010   uterine fibroids  . TUBAL LIGATION    . UPPER GASTROINTESTINAL ENDOSCOPY  Nov 2012   mild gastritis, patent esophageal ring s/p forceps dilation, chronic gastritis on path     ALLERGIES:  No Known Allergies   CURRENT MEDICATIONS:  Outpatient Encounter Prescriptions as of 09/17/2016  Medication Sig  . metFORMIN (GLUCOPHAGE-XR) 500 MG 24 hr tablet Take 500  mg by mouth at bedtime.   . Multiple Vitamin (MULTIVITAMIN) capsule Take 1 capsule by mouth daily.    Marland Kitchen omeprazole (PRILOSEC) 20 MG capsule TAKE 1 CAPSULE DAILY.  . pravastatin (PRAVACHOL) 40 MG tablet Take 40 mg by mouth at bedtime.   . tamoxifen (NOLVADEX) 20 MG tablet Take 1 tablet (20 mg total) by mouth daily.   No facility-administered encounter medications on file as of 09/17/2016.      ONCOLOGIC FAMILY HISTORY:  Family History  Problem Relation Age of Onset  . Colon cancer Mother 2  . Colon cancer Brother 80  . Diabetes Brother      GENETIC COUNSELING/TESTING: Has had at GYN office?  Is going to mail into [place].    SOCIAL HISTORY:  Kerri Shah is married and lives with her husband in [place], [place].  Kerri Shah is currently retired, however she does keep her grandchildren who are 5 and 6.  She denies any current or history of tobacco, alcohol, or illicit drug use.     PHYSICAL EXAMINATION:  Vital Signs:   Vitals:   09/17/16 0842  BP: (!) 145/73  Pulse: 85  Resp: 18  Temp: 98.1 F (36.7 C)  SpO2: 100%   Filed Weights   09/17/16 0842  Weight: 177 lb 6.4 oz (80.5 kg)   General: Well-nourished, well-appearing female in no acute distress.  She is unaccompanied today.   HEENT: Head is normocephalic.  Pupils equal and reactive to light. Conjunctivae clear without exudate.  Sclerae anicteric. Oral mucosa is pink, moist.  Oropharynx is pink without lesions or erythema.  Lymph: No cervical, supraclavicular, or infraclavicular lymphadenopathy noted on palpation.  Cardiovascular: Regular rate and rhythm.Marland Kitchen Respiratory: Clear to auscultation bilaterally. Chest expansion symmetric; breathing non-labored.  GI: Abdomen soft and round; non-tender, non-distended. Bowel sounds normoactive.  GU: Deferred.  Neuro: No focal deficits. Steady gait.  Psych: Mood and affect normal and appropriate for situation.  Extremities: No edema. MSK: No focal spinal tenderness to  palpation.  Full range of motion in bilateral upper extremities Skin: Warm and dry.  LABORATORY DATA:  None for this visit.  DIAGNOSTIC IMAGING:  None for this visit.      ASSESSMENT AND PLAN:  Ms.. Shah is a pleasant 56 y.o. female with Stage 0 left breast DCIS, ER+/PR+, diagnosed in 12/2015, treated with lumpectomy, adjuvant radiation therapy, and anti-estrogen therapy with Tamoxifen beginning in 04/2016.  She presents to the Survivorship Clinic for our initial meeting and routine follow-up post-completion of treatment for breast cancer.    1. Stage 0 left breast cancer:  Kerri Shah is continuing to recover from definitive treatment for breast cancer. She will follow-up with her medical oncologist, Dr. Lindi Adie in 03/2017 with history and physical exam per surveillance protocol.  She will continue her anti-estrogen therapy with Tamoxifen. Thus far, she is tolerating the Tamoxifen well, with minimal side effects. She was instructed to make Dr. Lindi Adie or myself aware if she begins to experience any worsening side effects of the medication and I could see her back in clinic to help manage those side  effects, as needed.  Today, a comprehensive survivorship care plan and treatment summary was reviewed with the patient today detailing her breast cancer diagnosis, treatment course, potential late/long-term effects of treatment, appropriate follow-up care with recommendations for the future, and patient education resources.  A copy of this summary, along with a letter will be sent to the patient's primary care provider via mail/fax/In Basket message after today's visit.    2. Lymphedema: She will follow up with PT about this and follow their recommendations.   3. Bone health:  Given Kerri Shah's age/history of breast cancer, she is at risk for bone demineralization. She has not yet had DEXA, however has discussed a DEXA with her PCP before.  She sees them again in a few months.  I counseled her  that Tamoxifen will have a protective effect on her bones.  She was given education on specific activities to promote bone health.  I will defer bone density testing and management to her PCP.   4. Cancer screening:  Due to Kerri Shah's history and her age, she should receive screening for skin cancers, colon cancer.  She has had a TAH.  The information and recommendations are listed on the patient's comprehensive care plan/treatment summary and were reviewed in detail with the patient.    4. Health maintenance and wellness promotion: Kerri Shah was encouraged to consume 5-7 servings of fruits and vegetables per day. We reviewed the "Nutrition Rainbow" handout, as well as the handout "Take Control of Your Health and Reduce Your Cancer Risk" from the [place].  She was also encouraged to engage in moderate to vigorous exercise for 30 minutes per day most days of the week. We discussed the [employer] fitness program, which is designed for cancer survivors to help them become more physically fit after cancer treatments.  She was instructed to limit her alcohol consumption and continue to abstain from tobacco use.     5. Support services/counseling: It is not uncommon for this period of the patient's cancer care trajectory to be one of many emotions and stressors.  We discussed an opportunity for her to participate in the next session of [facility] ("Finding Your New Normal") support group series designed for patients after they have completed treatment.   Kerri Shah was encouraged to take advantage of our many other support services programs, support groups, and/or counseling in coping with her new life as a cancer survivor after completing anti-cancer treatment.  She was offered support today through active listening and expressive supportive counseling.  She was given information regarding our available services and encouraged to contact me with any questions or for help enrolling in any  of our support group/programs.    Dispo:   -Return to cancer center in 03/2017 for follow up with Dr. Lindi Adie  -Mammogram due in 11/2016 ordered today -Follow up with Dr. Barry Dienes later this month.   -She is welcome to return back to the Survivorship Clinic at any time; no additional follow-up needed at this time.  -Consider referral back to survivorship as a long-term survivor for continued surveillance  A total of (30) minutes of face-to-face time was spent with this patient with greater than 50% of that time in counseling and care-coordination.   Gardenia Phlegm, NP Survivorship Program [place] [phone]   Note: PRIMARY CARE PROVIDER Monico Blitz, [place] [phone]

## 2016-10-22 ENCOUNTER — Ambulatory Visit (INDEPENDENT_AMBULATORY_CARE_PROVIDER_SITE_OTHER): Payer: BLUE CROSS/BLUE SHIELD | Admitting: Gastroenterology

## 2016-10-22 ENCOUNTER — Encounter: Payer: Self-pay | Admitting: Gastroenterology

## 2016-10-22 VITALS — BP 155/86 | HR 104 | Temp 98.5°F | Ht 66.5 in | Wt 176.8 lb

## 2016-10-22 DIAGNOSIS — K760 Fatty (change of) liver, not elsewhere classified: Secondary | ICD-10-CM

## 2016-10-22 DIAGNOSIS — Z800 Family history of malignant neoplasm of digestive organs: Secondary | ICD-10-CM

## 2016-10-22 DIAGNOSIS — R1011 Right upper quadrant pain: Secondary | ICD-10-CM | POA: Diagnosis not present

## 2016-10-22 NOTE — Patient Instructions (Signed)
1. Next colonoscopy in 2023. We will send you a reminder.  2. Continue omeprazole once daily as before.  3. Have your liver blood work done at [facility].  4. Call with any right upper abdominal pain, vomiting, etc. 5. We will see you back in 6 months.    Fatty Liver Fatty liver, also called hepatic steatosis or steatohepatitis, is a condition in which too much fat has built up in your liver cells. The liver removes harmful substances from your bloodstream. It produces fluids your body needs. It also helps your body use and store energy from the food you eat. In many cases, fatty liver does not cause symptoms or problems. It is often diagnosed when tests are being done for other reasons. However, over time, fatty liver can cause inflammation that may lead to more serious liver problems, such as scarring of the liver (cirrhosis). What are the causes? Causes of fatty liver may include:  Drinking too much alcohol.  Poor nutrition.  Obesity.  Cushing syndrome.  Diabetes.  Hyperlipidemia.  Pregnancy.  Certain drugs.  Poisons.  Some viral infections.  What increases the risk? You may be more likely to develop fatty liver if you:  Abuse alcohol.  Are pregnant.  Are overweight.  Have diabetes.  Have hepatitis.  Have a high triglyceride level.  What are the signs or symptoms? Fatty liver often does not cause any symptoms. In cases where symptoms develop, they can include:  Fatigue.  Weakness.  Weight loss.  Confusion.  Abdominal pain.  Yellowing of your skin and the white parts of your eyes (jaundice).  Nausea and vomiting.  How is this diagnosed? Fatty liver may be diagnosed by:  Physical exam and medical history.  Blood tests.  Imaging tests, such as an ultrasound, CT scan, or MRI.  Liver biopsy. A small sample of liver tissue is removed using a needle. The sample is then looked at under a microscope.  How is this treated? Fatty liver is  often caused by other health conditions. Treatment for fatty liver may involve medicines and lifestyle changes to manage conditions such as:  Alcoholism.  High cholesterol.  Diabetes.  Being overweight or obese.  Follow these instructions at home:  Eat a healthy diet as directed by your health care provider.  Exercise regularly. This can help you lose weight and control your cholesterol and diabetes. Talk to your health care provider about an exercise plan and which activities are best for you.  Do not drink alcohol.  Take medicines only as directed by your health care provider. Contact a health care provider if: You have difficulty controlling your:  Blood sugar.  Cholesterol.  Alcohol consumption.  Get help right away if:  You have abdominal pain.  You have jaundice.  You have nausea and vomiting. This information is not intended to replace advice given to you by your health care provider. Make sure you discuss any questions you have with your health care provider. Document Released: 03/13/2005 Document Revised: 07/04/2015 Document Reviewed: 06/07/2013 Elsevier Interactive Patient Education  [employer].

## 2016-10-22 NOTE — Progress Notes (Signed)
      Primary Care Physician: Monico Blitz, MD  Primary Gastroenterologist:  Barney Drain, MD   Chief Complaint  Patient presents with  . fatty liver    f/u, doing ok    HPI: Kerri Shah is a 56 y.o. female here for follow-up. She had a colonoscopy earlier this year, had one simple adenoma and one hyperplastic polyp. Ultrasound in April showed cholelithiasis and fatty liver. No recent LFTs.  When patient was seen back in April she was complaining of a pulling/tugging sensation in the right upper quadrant. Some nausea. Taking omeprazole intermittently only. She has been on omeprazole daily. Clinically she is doing well. No further abdominal pain. No nausea. No heartburn. Bowel movements are okay. No blood in the stool or melena. Denies liver blood work in the last year.  Current Outpatient Prescriptions  Medication Sig Dispense Refill  . metFORMIN (GLUCOPHAGE-XR) 500 MG 24 hr tablet Take 500 mg by mouth at bedtime.   1  . Multiple Vitamin (MULTIVITAMIN) capsule Take 1 capsule by mouth daily.      Marland Kitchen omeprazole (PRILOSEC) 20 MG capsule TAKE 1 CAPSULE DAILY. 30 capsule 5  . pravastatin (PRAVACHOL) 40 MG tablet Take 40 mg by mouth at bedtime.   3  . tamoxifen (NOLVADEX) 20 MG tablet Take 1 tablet (20 mg total) by mouth daily. 90 tablet 3   No current facility-administered medications for this visit.     Allergies as of 10/22/2016  . (No Known Allergies)    ROS:  General: Negative for anorexia, weight loss, fever, chills, fatigue, weakness. ENT: Negative for hoarseness, difficulty swallowing , nasal congestion. CV: Negative for chest pain, angina, palpitations, dyspnea on exertion, peripheral edema.  Respiratory: Negative for dyspnea at rest, dyspnea on exertion, cough, sputum, wheezing.  GI: See history of present illness. GU:  Negative for dysuria, hematuria, urinary incontinence, urinary frequency, nocturnal urination.  Endo: Negative for unusual weight change.      Physical Examination:   BP (!) 155/86   Pulse (!) 104   Temp 98.5 F (36.9 C) (Oral)   Ht 5' 6.5" (1.689 m)   Wt 176 lb 12.8 oz (80.2 kg)   BMI 28.11 kg/m   General: Well-nourished, well-developed in no acute distress.  Eyes: No icterus. Mouth: Oropharyngeal mucosa moist and pink , no lesions erythema or exudate. Lungs: Clear to auscultation bilaterally.  Heart: Regular rate and rhythm, no murmurs rubs or gallops.  Abdomen: Bowel sounds are normal, nontender, nondistended, no hepatosplenomegaly or masses, no abdominal bruits or hernia , no rebound or guarding.   Extremities: No lower extremity edema. No clubbing or deformities. Neuro: Alert and oriented x 4   Skin: Warm and dry, no jaundice.   Psych: Alert and cooperative, normal mood and affect.

## 2016-10-22 NOTE — Progress Notes (Signed)
cc'ed to pcp °

## 2016-10-22 NOTE — Assessment & Plan Note (Signed)
2 first-degree relatives with colon cancer, mother and brother. Next colonoscopy in 5 years, May 2023. Updated NIC.

## 2016-10-22 NOTE — Assessment & Plan Note (Signed)
Resolved. Possibly more GERD related or gastritis. Doing better on PPI daily. She has gallstones. Discussed symptomatic cholelithiasis/cholecystitis warning signs.

## 2016-10-22 NOTE — Assessment & Plan Note (Signed)
Discussed fatty liver at length. Check LFTs. Encouraged walking at least 20 minutes daily 5 days a week. Tight glycemic control. Return to the office in 6 months.

## 2016-11-04 LAB — HEPATIC FUNCTION PANEL
AG Ratio: 1.9 (calc) (ref 1.0–2.5)
ALT: 15 U/L (ref 6–29)
AST: 14 U/L (ref 10–35)
Albumin: 4.4 g/dL (ref 3.6–5.1)
Alkaline phosphatase (APISO): 41 U/L (ref 33–130)
Bilirubin, Direct: 0.1 mg/dL (ref 0.0–0.2)
Globulin: 2.3 g/dL (calc) (ref 1.9–3.7)
Indirect Bilirubin: 0.3 mg/dL (calc) (ref 0.2–1.2)
TOTAL PROTEIN: 6.7 g/dL (ref 6.1–8.1)
Total Bilirubin: 0.4 mg/dL (ref 0.2–1.2)

## 2016-11-15 NOTE — Progress Notes (Signed)
Please let patient know her lfts are normal.

## 2016-11-16 NOTE — Progress Notes (Signed)
Pt is aware.  

## 2016-12-07 ENCOUNTER — Ambulatory Visit
Admission: RE | Admit: 2016-12-07 | Discharge: 2016-12-07 | Disposition: A | Discharge status: Home / Self Care | Payer: BLUE CROSS/BLUE SHIELD | Source: Ambulatory Visit | Attending: Adult Health | Admitting: Adult Health

## 2016-12-07 ENCOUNTER — Ambulatory Visit
Admission: RE | Admit: 2016-12-07 | Discharge: 2016-12-07 | Disposition: A | Discharge status: Home / Self Care | Payer: BLUE CROSS/BLUE SHIELD | Source: Ambulatory Visit | Attending: Internal Medicine | Admitting: Internal Medicine

## 2016-12-07 DIAGNOSIS — R928 Other abnormal and inconclusive findings on diagnostic imaging of breast: Secondary | ICD-10-CM

## 2016-12-07 DIAGNOSIS — D0512 Intraductal carcinoma in situ of left breast: Secondary | ICD-10-CM

## 2016-12-07 HISTORY — DX: Malignant neoplasm of unspecified site of unspecified female breast: C50.919

## 2017-03-18 ENCOUNTER — Ambulatory Visit: Payer: BLUE CROSS/BLUE SHIELD | Admitting: Hematology and Oncology

## 2017-03-22 ENCOUNTER — Inpatient Hospital Stay: Payer: BLUE CROSS/BLUE SHIELD | Attending: Hematology and Oncology | Admitting: Hematology and Oncology

## 2017-03-22 ENCOUNTER — Telehealth: Payer: Self-pay | Admitting: Hematology and Oncology

## 2017-03-22 VITALS — BP 168/89 | HR 102 | Temp 98.3°F | Resp 18 | Ht 66.5 in | Wt 181.5 lb

## 2017-03-22 DIAGNOSIS — Z7984 Long term (current) use of oral hypoglycemic drugs: Secondary | ICD-10-CM

## 2017-03-22 DIAGNOSIS — C50412 Malignant neoplasm of upper-outer quadrant of left female breast: Secondary | ICD-10-CM | POA: Diagnosis not present

## 2017-03-22 DIAGNOSIS — D0512 Intraductal carcinoma in situ of left breast: Secondary | ICD-10-CM | POA: Insufficient documentation

## 2017-03-22 DIAGNOSIS — Z79899 Other long term (current) drug therapy: Secondary | ICD-10-CM

## 2017-03-22 DIAGNOSIS — Z79810 Long term (current) use of selective estrogen receptor modulators (SERMs): Secondary | ICD-10-CM | POA: Insufficient documentation

## 2017-03-22 DIAGNOSIS — Z923 Personal history of irradiation: Secondary | ICD-10-CM | POA: Diagnosis not present

## 2017-03-22 DIAGNOSIS — M791 Myalgia, unspecified site: Secondary | ICD-10-CM

## 2017-03-22 DIAGNOSIS — Z170 Estrogen receptor positive status [ER+]: Secondary | ICD-10-CM | POA: Diagnosis not present

## 2017-03-22 DIAGNOSIS — R232 Flushing: Secondary | ICD-10-CM | POA: Diagnosis not present

## 2017-03-22 MED ORDER — MELOXICAM 7.5 MG PO TABS: 7.5000 mg | ORAL_TABLET | Freq: Every day | ORAL | Status: DC

## 2017-03-22 MED ORDER — TAMOXIFEN CITRATE 20 MG PO TABS: 20.0000 mg | ORAL_TABLET | Freq: Every day | ORAL | 3 refills | Status: DC

## 2017-03-22 NOTE — Assessment & Plan Note (Signed)
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 Breast conserving surgery: 01/30/16: ADH 2. Followed by adjuvant radiation therapy 3. Followed by antiestrogen therapy with tamoxifen 5 years started 04/28/2016  Tamoxifen toxicities: 1. Occasional hot flashes 2. Myalgias  Genetic counseling: Being done today Return to clinic in 1 year

## 2017-03-22 NOTE — Telephone Encounter (Signed)
Gave patient AVS and calendar of upcoming February 2020 appointments.  °

## 2017-03-22 NOTE — Progress Notes (Signed)
Patient Care Team: Monico Blitz, [name], MD (Gastroenterology) Delice Bison Charlestine Massed, NP as Nurse Practitioner (Hematology and Oncology) Nicholas Lose, MD as Consulting Physician (Hematology and Oncology) Eppie Gibson, MD as Attending Physician (Radiation Oncology) [name], MD as Consulting Physician (General Surgery)  DIAGNOSIS:  Encounter Diagnoses  Name Primary?  . Carcinoma of upper-outer quadrant of left breast in female, estrogen receptor positive ([place]) Yes  . Ductal carcinoma in situ (DCIS) of left breast     SUMMARY OF ONCOLOGIC HISTORY:   Ductal carcinoma in situ (DCIS) of left breast   12/31/2015 Initial Diagnosis    Left breast [name] biopsy: DCIS with calcifications ER 90%, PR -50%, high-grade      01/30/2016 Surgery    Left Lumpectomy: No residual DCIS, fibrocystic changes with UDH      02/27/2016 - 04/16/2016 Radiation Therapy    Adjuvant radiation [facility] in [facility]      04/28/2016 -  Anti-estrogen oral therapy    Tamoxifen 20 mg daily 5 years       CHIEF COMPLIANT: Follow-up on tamoxifen therapy  INTERVAL HISTORY: Kerri Shah is a 57 year old with above-mentioned history of left breast DCIS who underwent lumpectomy radiation is currently on tamoxifen therapy.  She appears to be tolerating tamoxifen extremely well.  She denies any lumps or nodules in the breast.  REVIEW OF SYSTEMS:   Constitutional: Denies fevers, chills or abnormal weight loss Eyes: Denies blurriness of vision Ears, nose, mouth, throat, and face: Denies mucositis or sore throat Respiratory: Denies cough, dyspnea or wheezes Cardiovascular: Denies palpitation, chest discomfort Gastrointestinal:  Denies nausea, heartburn or change in bowel habits Skin: Denies abnormal skin rashes Lymphatics: Denies new lymphadenopathy or easy bruising Neurological:Denies numbness, tingling or new weaknesses Behavioral/Psych: Mood is stable, no  new changes  Extremities: No lower extremity edema Breast:  denies any pain or lumps or nodules in either breasts All other systems were reviewed with the patient and are negative.  I have reviewed the past medical history, past surgical history, social history and family history with the patient and they are unchanged from previous note.  ALLERGIES:  has No Known Allergies.  MEDICATIONS:  Current Outpatient Medications  Medication Sig Dispense Refill  . meloxicam (MOBIC) 7.5 MG tablet Take 1 tablet (7.5 mg total) by mouth daily.    . metFORMIN (GLUCOPHAGE-XR) 500 MG 24 hr tablet Take 500 mg by mouth at bedtime.   1  . Multiple Vitamin (MULTIVITAMIN) capsule Take 1 capsule by mouth daily.      Marland Kitchen omeprazole (PRILOSEC) 20 MG capsule TAKE 1 CAPSULE DAILY. 30 capsule 5  . pravastatin (PRAVACHOL) 40 MG tablet Take 40 mg by mouth at bedtime.   3  . tamoxifen (NOLVADEX) 20 MG tablet Take 1 tablet (20 mg total) by mouth daily. 90 tablet 3   No current facility-administered medications for this visit.     PHYSICAL EXAMINATION: ECOG PERFORMANCE STATUS: 1 - Symptomatic but completely ambulatory  Vitals:   03/22/17 0928  BP: (!) 168/89  Pulse: (!) 102  Resp: 18  Temp: 98.3 F (36.8 C)  SpO2: 100%   Filed Weights   03/22/17 0928  Weight: 181 lb 8 oz (82.3 kg)    GENERAL:alert, no distress and comfortable SKIN: skin color, texture, turgor are normal, no rashes or significant lesions EYES: normal, Conjunctiva are pink and non-injected, sclera clear OROPHARYNX:no exudate, no erythema and lips, buccal mucosa, and tongue normal  NECK: supple,  thyroid normal size, non-tender, without nodularity LYMPH:  no palpable lymphadenopathy in the cervical, axillary or inguinal LUNGS: clear to auscultation and percussion with normal breathing effort HEART: regular rate & rhythm and no murmurs and no lower extremity edema ABDOMEN:abdomen soft, non-tender and normal bowel sounds MUSCULOSKELETAL:no  cyanosis of digits and no clubbing  NEURO: alert & oriented x 3 with fluent speech, no focal motor/sensory deficits EXTREMITIES: No lower extremity edema BREAST:  no palpable masses or nodules in either right or left breasts. No palpable axillary supraclavicular or infraclavicular adenopathy no breast tenderness or nipple discharge. (exam performed in the presence of a chaperone)  LABORATORY DATA:  I have reviewed the data as listed CMP Latest Ref Rng & Units 11/04/2016 01/27/2016 10/30/2010  Glucose 65 - 99 mg/dL - 129(H) -  BUN 6 - 20 mg/dL - 7 9  Creatinine 0.44 - 1.00 mg/dL - 0.85 0.78  Sodium 135 - 145 mmol/L - 142 -  Potassium 3.5 - 5.1 mmol/L - 3.9 -  Chloride 101 - 111 mmol/L - 105 -  CO2 22 - 32 mmol/L - 28 -  Calcium 8.9 - 10.3 mg/dL - 10.3 9.3  Total Protein 6.1 - 8.1 g/dL 6.7 - 7.1  Total Bilirubin 0.2 - 1.2 mg/dL 0.4 - 0.4  Alkaline Phos U/L - - 59  AST 10 - 35 U/L 14 - 18  ALT 6 - 29 U/L 15 - 27    No results found for: WBC, HGB, HCT, MCV, PLT, NEUTROABS  ASSESSMENT & PLAN:  Ductal carcinoma in situ (DCIS) of left breast Left breast [name] biopsy 12/31/2015: 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 Breast conserving surgery: 01/30/16: ADH 2. Followed by adjuvant radiation therapy 3. Followed by antiestrogen therapy with tamoxifen 5 years started 04/28/2016  Tamoxifen toxicities: 1. Occasional hot flashes 2. Myalgias  Return to clinic in 1 year  I spent 25 minutes talking to the patient of which more than half was spent in counseling and coordination of care.  No orders of the defined types were placed in this encounter.  The patient has a good understanding of the overall plan. she agrees with it. she will call with any problems that may develop before the next visit here.   Harriette Ohara, MD 03/22/17

## 2017-04-21 ENCOUNTER — Ambulatory Visit (INDEPENDENT_AMBULATORY_CARE_PROVIDER_SITE_OTHER): Payer: BLUE CROSS/BLUE SHIELD | Admitting: Gastroenterology

## 2017-04-21 ENCOUNTER — Encounter: Payer: Self-pay | Admitting: Gastroenterology

## 2017-04-21 VITALS — BP 155/90 | HR 97 | Temp 97.4°F | Ht 66.5 in | Wt 179.2 lb

## 2017-04-21 DIAGNOSIS — R1013 Epigastric pain: Secondary | ICD-10-CM | POA: Insufficient documentation

## 2017-04-21 DIAGNOSIS — K5900 Constipation, unspecified: Secondary | ICD-10-CM

## 2017-04-21 DIAGNOSIS — K760 Fatty (change of) liver, not elsewhere classified: Secondary | ICD-10-CM

## 2017-04-21 MED ORDER — LINACLOTIDE 72 MCG PO CAPS: 72.0000 ug | ORAL_CAPSULE | Freq: Every day | ORAL | 0 refills | Status: DC

## 2017-04-21 NOTE — Assessment & Plan Note (Signed)
Suspect epig pain when bending over due to diastasis recti. Not meal related making less likely due to biliary etiology, gastritis, PUD. She in on NSAIDS but PPI as well. Reports iron deficiency and now on iron for several months. Obtain labs, further recommendations to follow.

## 2017-04-21 NOTE — Assessment & Plan Note (Signed)
Add Linzess 38mcg daily. Samples provided. She will call for RX if helpful or for dose adjustment etc.

## 2017-04-21 NOTE — Progress Notes (Signed)
CC'ED TO PCP 

## 2017-04-21 NOTE — Progress Notes (Signed)
      Primary Care Physician: Monico Blitz, MD  Primary Gastroenterologist: Barney Drain, MD   Chief Complaint  Patient presents with  . Abdominal Pain  . fatty liver    HPI: Kerri Shah is a 57 y.o. female here for follow-up. She had colonoscopy 2018 with one simple adenoma and one hyperplastic polyp. Next colonoscopy in five years due to two first-degree relatives with colon cancer, mother and brother. U/S in 05/2016 with cholelithiasis and fatty liver, normal LFTs back in September.. She has a history of a pulling/tugging sensation in the right upper quadrant with some nausea.  Patient reports being started on iron several months ago by PCP. Stools are dark. BM QOD, hard stool. Aggravates hemorrhoids at time. Occasional brbpr with hemorrhoid flare. No heartburn or N/V. Occasional pulling pain in RUQ lasts for seconds at a time. Not necessarily related to meals. Noticed discomfort in epigastric region with bending over cleaning the tub the other day.  Current Outpatient Medications  Medication Sig Dispense Refill  . meloxicam (MOBIC) 7.5 MG tablet Take 1 tablet (7.5 mg total) by mouth daily.    . metFORMIN (GLUCOPHAGE-XR) 500 MG 24 hr tablet Take 500 mg by mouth at bedtime.   1  . Multiple Vitamin (MULTIVITAMIN) capsule Take 1 capsule by mouth every other day.     Marland Kitchen omeprazole (PRILOSEC) 20 MG capsule TAKE 1 CAPSULE DAILY. 30 capsule 5  . pravastatin (PRAVACHOL) 40 MG tablet Take 40 mg by mouth at bedtime.   3  . tamoxifen (NOLVADEX) 20 MG tablet Take 1 tablet (20 mg total) by mouth daily. 90 tablet 3   No current facility-administered medications for this visit.     Allergies as of 04/21/2017  . (No Known Allergies)    ROS:  General: Negative for anorexia, weight loss, fever, chills, fatigue, weakness. ENT: Negative for hoarseness, difficulty swallowing , nasal congestion. CV: Negative for chest pain, angina, palpitations, dyspnea on exertion, peripheral edema.    Respiratory: Negative for dyspnea at rest, dyspnea on exertion, cough, sputum, wheezing.  GI: See history of present illness. GU:  Negative for dysuria, hematuria, urinary incontinence, urinary frequency, nocturnal urination.  Endo: Negative for unusual weight change.    Physical Examination:   BP (!) 155/90   Pulse 97   Temp (!) 97.4 F (36.3 C) (Oral)   Ht 5' 6.5" (1.689 m)   Wt 179 lb 3.2 oz (81.3 kg)   BMI 28.49 kg/m   General: Well-nourished, well-developed in no acute distress.  Eyes: No icterus. Mouth: Oropharyngeal mucosa moist and pink , no lesions erythema or exudate. Lungs: Clear to auscultation bilaterally.  Heart: Regular rate and rhythm, no murmurs rubs or gallops.  Abdomen: Bowel sounds are normal, nontender, nondistended, no hepatosplenomegaly or masses, no abdominal bruits, no rebound or guarding.  ?tiny umb hernia. Rectus diastasis. Extremities: No lower extremity edema. No clubbing or deformities. Neuro: Alert and oriented x 4   Skin: Warm and dry, no jaundice.   Psych: Alert and cooperative, normal mood and affect.  Labs:  Lab Results  Component Value Date   ALT 15 11/04/2016   AST 14 11/04/2016   ALKPHOS 59 10/30/2010   BILITOT 0.4 11/04/2016    Imaging Studies: No results found.

## 2017-04-21 NOTE — Patient Instructions (Signed)
1. Start Linzess once daily on empty stomach for constipation. Samples provided. Please call for prescription if you want to continue. 2. I will review labs from PCP and make further recommendations.

## 2017-05-26 ENCOUNTER — Ambulatory Visit: Payer: BLUE CROSS/BLUE SHIELD | Admitting: Hematology and Oncology

## 2017-06-25 ENCOUNTER — Other Ambulatory Visit: Payer: Self-pay

## 2017-06-25 ENCOUNTER — Emergency Department (HOSPITAL_COMMUNITY): Payer: BLUE CROSS/BLUE SHIELD

## 2017-06-25 ENCOUNTER — Emergency Department (HOSPITAL_COMMUNITY)
Admission: EM | Admit: 2017-06-25 | Discharge: 2017-06-25 | Disposition: A | Discharge status: Home / Self Care | Payer: BLUE CROSS/BLUE SHIELD | Attending: Emergency Medicine | Admitting: Emergency Medicine

## 2017-06-25 ENCOUNTER — Encounter (HOSPITAL_COMMUNITY): Payer: Self-pay | Admitting: Emergency Medicine

## 2017-06-25 DIAGNOSIS — Z79899 Other long term (current) drug therapy: Secondary | ICD-10-CM | POA: Insufficient documentation

## 2017-06-25 DIAGNOSIS — E119 Type 2 diabetes mellitus without complications: Secondary | ICD-10-CM | POA: Insufficient documentation

## 2017-06-25 DIAGNOSIS — K8020 Calculus of gallbladder without cholecystitis without obstruction: Secondary | ICD-10-CM | POA: Insufficient documentation

## 2017-06-25 DIAGNOSIS — R110 Nausea: Secondary | ICD-10-CM

## 2017-06-25 DIAGNOSIS — Z853 Personal history of malignant neoplasm of breast: Secondary | ICD-10-CM | POA: Insufficient documentation

## 2017-06-25 DIAGNOSIS — R112 Nausea with vomiting, unspecified: Secondary | ICD-10-CM | POA: Diagnosis present

## 2017-06-25 DIAGNOSIS — Z7984 Long term (current) use of oral hypoglycemic drugs: Secondary | ICD-10-CM | POA: Diagnosis not present

## 2017-06-25 DIAGNOSIS — N200 Calculus of kidney: Secondary | ICD-10-CM | POA: Diagnosis not present

## 2017-06-25 LAB — COMPREHENSIVE METABOLIC PANEL
ALT: 25 U/L (ref 14–54)
AST: 24 U/L (ref 15–41)
Albumin: 4.4 g/dL (ref 3.5–5.0)
Alkaline Phosphatase: 34 U/L — ABNORMAL LOW (ref 38–126)
Anion gap: 8 (ref 5–15)
BUN: 20 mg/dL (ref 6–20)
CHLORIDE: 102 mmol/L (ref 101–111)
CO2: 27 mmol/L (ref 22–32)
CREATININE: 0.84 mg/dL (ref 0.44–1.00)
Calcium: 9.2 mg/dL (ref 8.9–10.3)
Glucose, Bld: 128 mg/dL — ABNORMAL HIGH (ref 65–99)
POTASSIUM: 3.8 mmol/L (ref 3.5–5.1)
Sodium: 137 mmol/L (ref 135–145)
TOTAL PROTEIN: 7.3 g/dL (ref 6.5–8.1)
Total Bilirubin: 0.6 mg/dL (ref 0.3–1.2)

## 2017-06-25 LAB — LIPASE, BLOOD: LIPASE: 31 U/L (ref 11–51)

## 2017-06-25 LAB — CBC WITH DIFFERENTIAL/PLATELET
Basophils Absolute: 0 10*3/uL (ref 0.0–0.1)
Basophils Relative: 1 %
EOS ABS: 0 10*3/uL (ref 0.0–0.7)
Eosinophils Relative: 1 %
HCT: 34.8 % — ABNORMAL LOW (ref 36.0–46.0)
Hemoglobin: 11.1 g/dL — ABNORMAL LOW (ref 12.0–15.0)
LYMPHS ABS: 0.8 10*3/uL (ref 0.7–4.0)
Lymphocytes Relative: 25 %
MCH: 23.1 pg — AB (ref 26.0–34.0)
MCHC: 31.9 g/dL (ref 30.0–36.0)
MCV: 72.5 fL — AB (ref 78.0–100.0)
MONOS PCT: 6 %
Monocytes Absolute: 0.2 10*3/uL (ref 0.1–1.0)
Neutro Abs: 2.2 10*3/uL (ref 1.7–7.7)
Neutrophils Relative %: 67 %
PLATELETS: 225 10*3/uL (ref 150–400)
RBC: 4.8 MIL/uL (ref 3.87–5.11)
RDW: 14 % (ref 11.5–15.5)
WBC: 3.3 10*3/uL — ABNORMAL LOW (ref 4.0–10.5)

## 2017-06-25 LAB — URINALYSIS, ROUTINE W REFLEX MICROSCOPIC
BILIRUBIN URINE: NEGATIVE
Glucose, UA: NEGATIVE mg/dL
Hgb urine dipstick: NEGATIVE
Ketones, ur: NEGATIVE mg/dL
Leukocytes, UA: NEGATIVE
Nitrite: NEGATIVE
PROTEIN: NEGATIVE mg/dL
SPECIFIC GRAVITY, URINE: 1.004 — AB (ref 1.005–1.030)
pH: 6 (ref 5.0–8.0)

## 2017-06-25 MED ORDER — ONDANSETRON HCL 8 MG PO TABS: 8.0000 mg | ORAL_TABLET | Freq: Three times a day (TID) | ORAL | 0 refills | Status: DC | PRN

## 2017-06-25 NOTE — ED Provider Notes (Signed)
[facility] EMERGENCY DEPARTMENT Provider Note   CSN: [id number] Arrival date & time: 06/25/17  1404     History   Chief Complaint Chief Complaint  Patient presents with  . Nausea    HPI Kerri Shah is a 57 y.o. female.  HPI   She complains of nausea for several days.  She also complains of generalized weakness.  Today she had multiple episodes of vomiting after eating food.  She denies diarrhea, last normal bowel movement was this morning.  Nausea has been intermittent for several days.  She was told last year that she had gallstones but did not see a surgeon about removing it and has not had problems since then.  He denies fever, chills, cough, shortness of breath, paresthesia or focal weakness.  There are no other known modifying factors.  Past Medical History:  Diagnosis Date  . Breast cancer ([place]) 2017   left breast lumpectomy  . Breast discharge    lt bloody discharge x's 3 weeks   . Breast mass    lt breast mass x's 3 weeks   . Complication of anesthesia   . DM (diabetes mellitus) ([place])    not on any medications  . Fatty liver   . GERD (gastroesophageal reflux disease)   . Hypercholesteremia   . Osteoarthritis    bilateral knees and Left shoulder  . Personal history of radiation therapy 04/2016   radiation   . PONV (postoperative nausea and vomiting)   . Vitamin D deficiency     Patient Active Problem List   Diagnosis Date Noted  . Constipation 04/21/2017  . Abdominal pain, epigastric 04/21/2017  . Encounter for screening colonoscopy   . 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 Carcinoma of upper-outer quadrant of left breast in female, estrogen receptor positive ([place]) 01/21/2016  . Hematochezia 12/01/2010  . RUQ pain 12/01/2010  . Family history of colon cancer 12/01/2010  . Fatty liver 12/01/2010    Past Surgical History:  Procedure Laterality Date  . ABDOMINAL HYSTERECTOMY    . BREAST LUMPECTOMY Left 2018  . BREAST  LUMPECTOMY WITH RADIOACTIVE SEED LOCALIZATION Left 01/30/2016   Procedure: BREAST LUMPECTOMY WITH RADIOACTIVE SEED LOCALIZATION;  Surgeon: [name], MD;  Location: [place];  Service: General;  Laterality: Left;  . COLONOSCOPY  12/23/10   internal hemorrhoids/polyps in the recto-sigmoid colon, hyperplastic, TCS IN 5 years  . COLONOSCOPY N/A 06/29/2016   Procedure: COLONOSCOPY;  Surgeon: Danie Binder, MD;  [facility];  Service: Endoscopy;  Laterality: N/A;  10:30am  . PARTIAL HYSTERECTOMY  2010   uterine fibroids  . TUBAL LIGATION    . UPPER GASTROINTESTINAL ENDOSCOPY  Nov 2012   mild gastritis, patent esophageal ring s/p forceps dilation, chronic gastritis on path     OB History   None      Home Medications    Prior to Admission medications   Medication Sig Start Date End Date Taking? Authorizing Provider  ferrous sulfate 325 (65 FE) MG tablet Take 325 mg by mouth daily with breakfast.   Yes [provider]  linaclotide (LINZESS) 72 MCG capsule Take 1 capsule (72 mcg total) by mouth daily before breakfast. On an empty stomach 04/21/17  Yes Mahala Menghini, PA-C  meloxicam (MOBIC) 7.5 MG tablet Take 1 tablet (7.5 mg total) by mouth daily. Patient taking differently: Take 7.5 mg by mouth at bedtime.  03/22/17  Yes Nicholas Lose, MD  metFORMIN ([place])  500 MG 24 hr tablet Take 500 mg by mouth at bedtime.  01/16/16  Yes [provider]  Multiple Vitamin (MULTIVITAMIN) capsule Take 1 capsule by mouth every other day.    Yes [provider]  omeprazole (PRILOSEC) 20 MG capsule TAKE 1 CAPSULE DAILY. Patient taking differently: TAKE 1 CAPSULE at bedtime 08/29/12  Yes Annitta Needs, NP  pravastatin (PRAVACHOL) 40 MG tablet Take 40 mg by mouth at bedtime.  01/13/16  Yes [provider]  tamoxifen (NOLVADEX) 20 MG tablet Take 1 tablet (20 mg total) by mouth daily. Patient taking differently: Take 20 mg by mouth at  bedtime.  03/22/17  Yes Nicholas Lose, MD  ondansetron (ZOFRAN) 8 MG tablet Take 1 tablet (8 mg total) by mouth every 8 (eight) hours as needed for nausea or vomiting. 06/25/17   Daleen Bo, MD    Family History Family History  Problem Relation Age of Onset  . Colon cancer Mother 28  . Colon cancer Brother 79  . Diabetes Brother     Social History Social History   Tobacco Use  . Smoking status: Never Smoker  . Smokeless tobacco: Never Used  Substance Use Topics  . Alcohol use: No  . Drug use: No     Allergies   Patient has no known allergies.   Review of Systems Review of Systems  All other systems reviewed and are negative.    Physical Exam Updated Vital Signs BP (!) 144/67   Pulse 89   Temp 98.5 F (36.9 C) (Oral)   Resp 16   Ht _0  (1.676 m)   Wt 79.8 kg (176 lb)   SpO2 100%   BMI 28.41 kg/m   Physical Exam  Constitutional: She is oriented to person, place, and time. She appears well-developed and well-nourished.  HENT:  Head: Normocephalic and atraumatic.  Eyes: Pupils are equal, round, and reactive to light. Conjunctivae and EOM are normal.  Neck: Normal range of motion and phonation normal. Neck supple.  Cardiovascular: Normal rate and regular rhythm.  Pulmonary/Chest: Effort normal and breath sounds normal. She exhibits no tenderness.  Abdominal: Soft. She exhibits no distension and no mass. There is tenderness (Right upper quadrant tenderness, mild). There is no rebound and no guarding.  Musculoskeletal: Normal range of motion.  Neurological: She is alert and oriented to person, place, and time. She exhibits normal muscle tone.  Skin: Skin is warm and dry.  Psychiatric: She has a normal mood and affect. Her behavior is normal. Judgment and thought content normal.  Nursing note and vitals reviewed.    ED Treatments / Results  Labs (all labs ordered are listed, but only abnormal results are displayed) Labs Reviewed  CBC WITH  DIFFERENTIAL/PLATELET - Abnormal; Notable for the following components:      Result Value   WBC 3.3 (*)    Hemoglobin 11.1 (*)    HCT 34.8 (*)    MCV 72.5 (*)    MCH 23.1 (*)    All other components within normal limits  COMPREHENSIVE METABOLIC PANEL - Abnormal; Notable for the following components:   Glucose, Bld 128 (*)    Alkaline Phosphatase 34 (*)    All other components within normal limits  URINALYSIS, ROUTINE W REFLEX MICROSCOPIC - Abnormal; Notable for the following components:   Color, Urine STRAW (*)    Specific Gravity, Urine 1.004 (*)    All other components within normal limits  LIPASE, BLOOD    EKG None  Radiology  US Abdomen Complete  Result Date: 06/25/2017 CLINICAL DATA:  Upper abdominal pain over the last 5 days. EXAM: ABDOMEN ULTRASOUND COMPLETE COMPARISON:  Ultrasound 06/04/2016 FINDINGS: Gallbladder: Multiple shadowing stones within the gallbladder. No wall thickening or surrounding fluid. No Murphy sign. Largest stone 1 cm. Common bile duct: Diameter: 3 mm common normal Liver: Diffusely echogenic suggesting fatty change. No focal lesions seen. Portal vein is patent on color Doppler imaging with normal direction of blood flow towards the liver. IVC: No abnormality visualized. Pancreas: Visualized portion unremarkable. Spleen: Size and appearance within normal limits. Right Kidney: Length: 10.3 cm. Echogenicity within normal limits. No mass or hydronephrosis visualized. 6 mm nonobstructing stone. Left Kidney: Length: 10.8 cm. Echogenicity within normal limits. No mass or hydronephrosis visualized. Abdominal aorta: No aneurysm visualized. Other findings: None. IMPRESSION: Multiple gallstones in the gallbladder as seen previously. No sonographic evidence of cholecystitis or obstruction however. Nonobstructing 6 mm stone in the right kidney. Echogenic liver suggesting fatty change. Electronically Signed   By: Nelson Chimes M.D.   On: 06/25/2017 16:42     Procedures Procedures (including critical care time)  Medications Ordered in ED Medications - No data to display   Initial Impression / Assessment and Plan / ED Course  I have reviewed the triage vital signs and the nursing notes.  Pertinent labs & imaging results that were available during my care of the patient were reviewed by me and considered in my medical decision making (see chart for details).  Clinical Course as of Jun 26 1719  Fri Jun 25, 2017  1701 Normal  Lipase, blood [EW]  1701 Normal except white count slightly low 3.3, hemoglobin low 11.1, MCV low 72.  CBC with Differential(!) [EW]  1702 Normal except glucose high 128 and alk phos stays low 34  Comprehensive metabolic panel(!) [EW]  0034 Normal  Urinalysis, Routine w reflex microscopic(!) [EW]  1702 Abnormal, gallstones present without obstruction or cholecystitis.  Kidney stone present right kidney, without obstruction.  Images reviewed   [EW]    Clinical Course User Index [EW] Daleen Bo, MD     Patient Vitals for the past 24 hrs:  BP Temp Temp src Pulse Resp SpO2 Height Weight  06/25/17 1714 - - - - 16 100 % - -  06/25/17 1700 (!) 144/67 - - - - - - -  06/25/17 1630 (!) 164/85 - - 89 - 100 % - -  06/25/17 1600 (!) 142/76 - - - - - - -  06/25/17 1530 (!) 141/74 - - 78 - 98 % - -  06/25/17 1500 127/75 - - 82 - 98 % - -  06/25/17 1445 - - - 86 - 100 % - -  06/25/17 1430 (!) 144/80 - - 80 - 100 % - -  06/25/17 1412 (!) 161/76 98.5 F (36.9 C) Oral 85 18 100 % _0  (1.676 m) 79.8 kg (176 lb)    5:03 PM Reevaluation with update and discussion. After initial assessment and treatment, an updated evaluation reveals no change in complaints, she remains fairly comfortable.  Findings discussed with patient and husband, all questions were answered.Daleen Bo   Medical Decision Making: Vague symptoms with decreased oral intake and nausea.  Patient with known prior gallstones, apparently unchanged  today.  New finding for right renal kidney stone, that is not causing hydronephrosis.  Doubt UTI, cholecystitis, serious bacterial infection or metabolic instability.  CRITICAL CARE-no Performed by: Daleen Bo   Nursing Notes Reviewed/ Care Coordinated Applicable  Imaging Reviewed Interpretation of Laboratory Data incorporated into ED treatment  The patient appears reasonably screened and/or stabilized for discharge and I doubt any other medical condition or other [facility] requiring further screening, evaluation, or treatment in the ED at this time prior to discharge. Plan: Home Medications-continue usual medications; Home Treatments-low-fat diet, plenty of fluids, and gradually advance to regular diet if tolerated.; return here if the recommended treatment, does not improve the symptoms; Recommended follow up-follow-up general surgery and urology for further evaluation and treatment as needed.     Final Clinical Impressions(s) / ED Diagnoses   Final diagnoses:  Gallstones  Kidney stone  Nausea    ED Discharge Orders        Ordered    ondansetron (ZOFRAN) 8 MG tablet  Every 8 hours PRN     06/25/17 1720       Daleen Bo, MD 06/25/17 1721

## 2017-06-25 NOTE — ED Notes (Signed)
Pt in [place]

## 2017-06-25 NOTE — ED Triage Notes (Signed)
Nausea and weakness from Sunday to Wednesday.  Pt reports she was fine from Wednesday until today.  States today she felt weak and nauseated while in the shower.  Denies any pain

## 2017-06-25 NOTE — Discharge Instructions (Signed)
Make sure you are drinking plenty of fluids especially water.  Avoid high fat diet.  The testing today indicates that you have both gallstones and a kidney stone.  It is not clear which of these, or if both are contributing to your discomfort today.  These should be evaluated further by a general surgeon and a urologist, respectively.  Please call the listed phone numbers to get an appointment for follow-up care for each.  Return here, if needed, for problems.

## 2017-07-08 ENCOUNTER — Encounter: Payer: Self-pay | Admitting: General Surgery

## 2017-07-08 ENCOUNTER — Ambulatory Visit: Payer: BLUE CROSS/BLUE SHIELD | Admitting: General Surgery

## 2017-07-08 VITALS — BP 171/90 | HR 86 | Temp 98.0°F | Resp 18 | Wt 176.5 lb

## 2017-07-08 DIAGNOSIS — K8020 Calculus of gallbladder without cholecystitis without obstruction: Secondary | ICD-10-CM

## 2017-07-08 NOTE — Progress Notes (Signed)
[facility] History and Physical  Reason for Referral:Gallstones  Referring Physician:  ED Referral   Chief Complaint    Abdominal Pain      Kerri Shah is a 57 y.o. female.  HPI: Kerri Shah is a 57 yo who is otherwise relatively healthy with a history of GERD, DM, Breast cancer who presented to the ED with abdominal pain, nausea/ burping who was found to have gallstones on her imaging done by the ED. Her labs were all reassuring, but she ultimately had relief of her pain and was sent home with follow up with surgery to discuss her options.    She denies any prior episodes that were like this, but does report some pain and bloating with some foods.   She has a history of chronic reflux and constipation, and has tried Linzess in the past with some success. She comes in today to discuss the options of cholecystectomy for the gallstones.  Denies excess NSAID use.    Past Medical History:  Diagnosis Date  . Breast cancer ([place]) 2017   left breast lumpectomy  . Breast discharge    lt bloody discharge x's 3 weeks   . Breast mass    lt breast mass x's 3 weeks   . Complication of anesthesia   . DM (diabetes mellitus) ([place]ri Shah)    not on any medications  . Fatty liver   . GERD (gastroesophageal reflux disease)   . Hypercholesteremia   . Osteoarthritis    bilateral knees and Left shoulder  . Personal history of radiation therapy 04/2016   radiation   . PONV (postoperative nausea and vomiting)   . Vitamin D deficiency     Past Surgical History:  Procedure Laterality Date  . ABDOMINAL HYSTERECTOMY    . BREAST LUMPECTOMY Left 2018  . BREAST LUMPECTOMY WITH RADIOACTIVE SEED LOCALIZATION Left 01/30/2016   Procedure: BREAST LUMPECTOMY WITH RADIOACTIVE SEED LOCALIZATION;  Surgeon: [name], MD;  Location: [place];  Service: General;  Laterality: Left;  . COLONOSCOPY  12/23/10   internal hemorrhoids/polyps in the recto-sigmoid colon,  hyperplastic, TCS IN 5 years  . COLONOSCOPY N/A 06/29/2016   Procedure: COLONOSCOPY;  Surgeon: Danie Binder, MD;  [facility];  Service: Endoscopy;  Laterality: N/A;  10:30am  . PARTIAL HYSTERECTOMY  2010   uterine fibroids  . TUBAL LIGATION    . UPPER GASTROINTESTINAL ENDOSCOPY  Nov 2012   mild gastritis, patent esophageal ring s/p forceps dilation, chronic gastritis on path    Family History  Problem Relation Age of Onset  . Colon cancer Mother 70  . Colon cancer Brother 59  . Diabetes Brother     Social History   Tobacco Use  . Smoking status: Never Smoker  . Smokeless tobacco: Never Used  Substance Use Topics  . Alcohol use: No  . Drug use: No    Medications: I have reviewed the patient's current medications. Allergies as of 07/08/2017   No Known Allergies     Medication List        Accurate as of 07/08/17  4:25 PM. Always use your most recent med list.          ferrous sulfate 325 (65 FE) MG tablet Take 325 mg by mouth daily with breakfast.   linaclotide 72 MCG capsule Commonly known as:  LINZESS Take 1 capsule (72 mcg total) by mouth daily before breakfast. On an empty stomach   meloxicam 7.5 MG tablet Commonly known  as:  MOBIC Take 1 tablet (7.5 mg total) by mouth daily.   metFORMIN 500 MG 24 hr tablet Commonly known as:  GLUCOPHAGE-XR Take 500 mg by mouth at bedtime.   multivitamin capsule Take 1 capsule by mouth every other day.   omeprazole 20 MG capsule Commonly known as:  PRILOSEC TAKE 1 CAPSULE DAILY.   ondansetron 8 MG tablet Commonly known as:  ZOFRAN Take 1 tablet (8 mg total) by mouth every 8 (eight) hours as needed for nausea or vomiting.   pravastatin 40 MG tablet Commonly known as:  PRAVACHOL Take 40 mg by mouth at bedtime.   tamoxifen 20 MG tablet Commonly known as:  NOLVADEX Take 1 tablet (20 mg total) by mouth daily.        ROS:  A comprehensive review of systems was negative except for: Cardiovascular:  positive for HTN Gastrointestinal: positive for abdominal pain and reflux symptoms Genitourinary: positive for frequency Musculoskeletal: positive for back pain, neck pain and stiff joints  Blood pressure (!) 171/90, pulse 86, temperature 98 F (36.7 C), temperature source Temporal, resp. rate 18, weight 176 lb 8 oz (80.1 kg). Physical Exam  Constitutional: She is oriented to person, place, and time. She appears well-developed.  HENT:  Head: Normocephalic.  Eyes: Pupils are equal, round, and reactive to light.  Cardiovascular: Normal rate and regular rhythm.  Pulmonary/Chest: Effort normal and breath sounds normal.  Abdominal: Normal appearance and bowel sounds are normal. There is no tenderness. There is no rigidity, no rebound and no guarding. A hernia is present.  Umbilical hernia, somewhat tender, reducible   Neurological: She is alert and oriented to person, place, and time.  Skin: Skin is warm and dry.  Psychiatric: She has a normal mood and affect. Her behavior is normal.  Vitals reviewed.   Results: [place] 06/2017 IMPRESSION: Multiple gallstones in the gallbladder as seen previously. No sonographic evidence of cholecystitis or obstruction however.  Nonobstructing 6 mm stone in the right kidney.  Echogenic liver suggesting fatty change.  LFTs wnl, lipase 31, Alk phos 34, AST/ ALT 24/25, T bili 0.6  Assessment & Plan:  Kerri Shah is a 57 y.o. female with possible symptomatic choleliathiasis based on her symptoms when she went to the ED, and possible other pain and nausea and burping that could be explained by stones.  She has not had any other issues since that episode.  She does have an umbilical hernia that she has had for some time.   All questions were answered to the satisfaction of the patient\.  PLAN: I counseled the patient about the indication, risks and benefits of laparoscopic cholecystectomy.  She understands there is a very small chance for bleeding,  infection, injury to normal structures (including common bile duct), conversion to open surgery, persistent symptoms, evolution of postcholecystectomy diarrhea, need for secondary interventions, anesthesia reaction, cardiopulmonary issues and other risks not specifically detailed here. I described the expected recovery, the plan for follow-up and the restrictions during the recovery phase.  All questions were answered. Discussed that some of her symptoms of burping and nausea may not be related to the gallbladder and could have some other etiology.   -Patient will decide if she wants to proceed with Laparoscopic Cholecystectomy in the upcoming weeks     Virl Cagey 07/08/2017, 4:25 PM

## 2017-07-08 NOTE — Patient Instructions (Addendum)
Surgeries on Monday, Wednesday, Fridays.  Call when ready to schedule.   Laparoscopic Cholecystectomy Laparoscopic cholecystectomy is surgery to remove the gallbladder. The gallbladder is a pear-shaped organ that lies beneath the liver on the right side of the body. The gallbladder stores bile, which is a fluid that helps the body to digest fats. Cholecystectomy is often done for inflammation of the gallbladder (cholecystitis). This condition is usually caused by a buildup of gallstones (cholelithiasis) in the gallbladder. Gallstones can block the flow of bile, which can result in inflammation and pain. In severe cases, emergency surgery may be required. This procedure is done though small incisions in your abdomen (laparoscopic surgery). A thin scope with a camera (laparoscope) is inserted through one incision. Thin surgical instruments are inserted through the other incisions. In some cases, a laparoscopic procedure may be turned into a type of surgery that is done through a larger incision (open surgery). Tell a health care provider about:  Any allergies you have.  All medicines you are taking, including vitamins, herbs, eye drops, creams, and over-the-counter medicines.  Any problems you or family members have had with anesthetic medicines.  Any blood disorders you have.  Any surgeries you have had.  Any medical conditions you have.  Whether you are pregnant or may be pregnant. What are the risks? Generally, this is a safe procedure. However, problems may occur, including:  Infection.  Bleeding.  Allergic reactions to medicines.  Damage to other structures or organs.  A stone remaining in the common bile duct. The common bile duct carries bile from the gallbladder into the small intestine.  A bile leak from the cyst duct that is clipped when your gallbladder is removed.  What happens before the procedure? Staying hydrated Follow instructions from your health care provider  about hydration, which may include:  Up to 2 hours before the procedure - you may continue to drink clear liquids, such as water, clear fruit juice, black coffee, and plain tea.  Eating and drinking restrictions Follow instructions from your health care provider about eating and drinking, which may include:  8 hours before the procedure - stop eating heavy meals or foods such as meat, fried foods, or fatty foods.  6 hours before the procedure - stop eating light meals or foods, such as toast or cereal.  6 hours before the procedure - stop drinking milk or drinks that contain milk.  2 hours before the procedure - stop drinking clear liquids.  Medicines  Ask your health care provider about: ? Changing or stopping your regular medicines. This is especially important if you are taking diabetes medicines or blood thinners. ? Taking medicines such as aspirin and ibuprofen. These medicines can thin your blood. Do not take these medicines before your procedure if your health care provider instructs you not to.  You may be given antibiotic medicine to help prevent infection. General instructions  Let your health care provider know if you develop a cold or an infection before surgery.  Plan to have someone take you home from the hospital or clinic.  Ask your health care provider how your surgical site will be marked or identified. What happens during the procedure?  To reduce your risk of infection: ? Your health care team will wash or sanitize their hands. ? Your skin will be washed with soap. ? Hair may be removed from the surgical area.  An IV tube may be inserted into one of your veins.  You will be given one  or more of the following: ? A medicine to help you relax (sedative). ? A medicine to make you fall asleep (general anesthetic).  A breathing tube will be placed in your mouth.  Your surgeon will make several small cuts (incisions) in your abdomen.  The laparoscope will be  inserted through one of the small incisions. The camera on the laparoscope will send images to a TV screen (monitor) in the operating room. This lets your surgeon see inside your abdomen.  Air-like gas will be pumped into your abdomen. This will expand your abdomen to give the surgeon more room to perform the surgery.  Other tools that are needed for the procedure will be inserted through the other incisions. The gallbladder will be removed through one of the incisions.  Your common bile duct may be examined. If stones are found in the common bile duct, they may be removed.  After your gallbladder has been removed, the incisions will be closed with stitches (sutures), staples, or skin glue.  Your incisions may be covered with a bandage (dressing). The procedure may vary among health care providers and hospitals. What happens after the procedure?  Your blood pressure, heart rate, breathing rate, and blood oxygen level will be monitored until the medicines you were given have worn off.  You will be given medicines as needed to control your pain.  Do not drive for 24 hours if you were given a sedative. This information is not intended to replace advice given to you by your health care provider. Make sure you discuss any questions you have with your health care provider. Document Released: 01/26/2005 Document Revised: 08/18/2015 Document Reviewed: 07/15/2015 Elsevier Interactive Patient Education  2018 [employer]. Cholelithiasis Cholelithiasis is also called "gallstones." It is a kind of gallbladder disease. The gallbladder is an organ that stores a liquid (bile) that helps you digest fat. Gallstones may not cause symptoms (may be silent gallstones) until they cause a blockage, and then they can cause pain (gallbladder attack). Follow these instructions at home:  Take over-the-counter and prescription medicines only as told by your doctor.  Stay at a healthy weight.  Eat healthy foods.  This includes: ? Eating fewer fatty foods, like fried foods. ? Eating fewer refined carbs (refined carbohydrates). Refined carbs are breads and grains that are highly processed, like white bread and white rice. Instead, choose whole grains like whole-wheat bread and brown rice. ? Eating more fiber. Almonds, fresh fruit, and beans are healthy sources of fiber.  Keep all follow-up visits as told by your doctor. This is important. Contact a doctor if:  You have sudden pain in the upper right side of your belly (abdomen). Pain might spread to your right shoulder or your chest. This may be a sign of a gallbladder attack.  You feel sick to your stomach (are nauseous).  You throw up (vomit).  You have been diagnosed with gallstones that have no symptoms and you get: ? Belly pain. ? Discomfort, burning, or fullness in the upper part of your belly (indigestion). Get help right away if:  You have sudden pain in the upper right side of your belly, and it lasts for more than 2 hours.  You have belly pain that lasts for more than 5 hours.  You have a fever or chills.  You keep feeling sick to your stomach or you keep throwing up.  Your skin or the whites of your eyes turn yellow (jaundice).  You have dark-colored pee (urine).  You  have light-colored poop (stool). Summary  Cholelithiasis is also called "gallstones."  The gallbladder is an organ that stores a liquid (bile) that helps you digest fat.  Silent gallstones are gallstones that do not cause symptoms.  A gallbladder attack may cause sudden pain in the upper right side of your belly. Pain might spread to your right shoulder or your chest. If this happens, contact your doctor.  If you have sudden pain in the upper right side of your belly that lasts for more than 2 hours, get help right away. This information is not intended to replace advice given to you by your health care provider. Make sure you discuss any questions you have  with your health care provider. Document Released: 07/15/2007 Document Revised: 10/13/2015 Document Reviewed: 10/13/2015 Elsevier Interactive Patient Education  2017 [employer]   Fatty Liver Fatty liver, also called hepatic steatosis or steatohepatitis, is a condition in which too much fat has built up in your liver cells. The liver removes harmful substances from your bloodstream. It produces fluids your body needs. It also helps your body use and store energy from the food you eat. In many cases, fatty liver does not cause symptoms or problems. It is often diagnosed when tests are being done for other reasons. However, over time, fatty liver can cause inflammation that may lead to more serious liver problems, such as scarring of the liver (cirrhosis). What are the causes? Causes of fatty liver may include:  Drinking too much alcohol.  Poor nutrition.  Obesity.  Cushing syndrome.  Diabetes.  Hyperlipidemia.  Pregnancy.  Certain drugs.  Poisons.  Some viral infections.  What increases the risk? You may be more likely to develop fatty liver if you:  Abuse alcohol.  Are pregnant.  Are overweight.  Have diabetes.  Have hepatitis.  Have a high triglyceride level.  What are the signs or symptoms? Fatty liver often does not cause any symptoms. In cases where symptoms develop, they can include:  Fatigue.  Weakness.  Weight loss.  Confusion.  Abdominal pain.  Yellowing of your skin and the white parts of your eyes (jaundice).  Nausea and vomiting.  How is this diagnosed? Fatty liver may be diagnosed by:  Physical exam and medical history.  Blood tests.  Imaging tests, such as an ultrasound, CT scan, or MRI.  Liver biopsy. A small sample of liver tissue is removed using a needle. The sample is then looked at under a microscope.  How is this treated? Fatty liver is often caused by other health conditions. Treatment for fatty liver may involve  medicines and lifestyle changes to manage conditions such as:  Alcoholism.  High cholesterol.  Diabetes.  Being overweight or obese.  Follow these instructions at home:  Eat a healthy diet as directed by your health care provider.  Exercise regularly. This can help you lose weight and control your cholesterol and diabetes. Talk to your health care provider about an exercise plan and which activities are best for you.  Do not drink alcohol.  Take medicines only as directed by your health care provider. Contact a health care provider if: You have difficulty controlling your:  Blood sugar.  Cholesterol.  Alcohol consumption.  Get help right away if:  You have abdominal pain.  You have jaundice.  You have nausea and vomiting. This information is not intended to replace advice given to you by your health care provider. Make sure you discuss any questions you have with your health care provider.  Document Released: 03/13/2005 Document Revised: 07/04/2015 Document Reviewed: 06/07/2013 Elsevier Interactive Patient Education  [employer].

## 2017-07-19 DIAGNOSIS — K8020 Calculus of gallbladder without cholecystitis without obstruction: Secondary | ICD-10-CM

## 2017-07-19 NOTE — H&P (Signed)
[facility] History and Physical  Reason for Referral:Gallstones  Referring Physician:  ED Referral      Chief Complaint    Abdominal Pain      Kerri Shah is a 57 y.o. female.  HPI: Kerri Shah is a 57 yo who is otherwise relatively healthy with a history of GERD, DM, Breast cancer who presented to the ED with abdominal pain, nausea/ burping who was found to have gallstones on her imaging done by the ED. Her labs were all reassuring, but she ultimately had relief of her pain and was sent home with follow up with surgery to discuss her options.    She denies any prior episodes that were like this, but does report some pain and bloating with some foods.   She has a history of chronic reflux and constipation, and has tried Linzess in the past with some success. She comes in today to discuss the options of cholecystectomy for the gallstones.  Denies excess NSAID use.        Past Medical History:  Diagnosis Date  . Breast cancer ([place]) 2017   left breast lumpectomy  . Breast discharge    lt bloody discharge x's 3 weeks   . Breast mass    lt breast mass x's 3 weeks   . Complication of anesthesia   . DM (diabetes mellitus) ([place])    not on any medications  . Fatty liver   . GERD (gastroesophageal reflux disease)   . Hypercholesteremia   . Osteoarthritis    bilateral knees and Left shoulder  . Personal history of radiation therapy 04/2016   radiation   . PONV (postoperative nausea and vomiting)   . Vitamin D deficiency          Past Surgical History:  Procedure Laterality Date  . ABDOMINAL HYSTERECTOMY    . BREAST LUMPECTOMY Left 2018  . BREAST LUMPECTOMY WITH RADIOACTIVE SEED LOCALIZATION Left 01/30/2016   Procedure: BREAST LUMPECTOMY WITH RADIOACTIVE SEED LOCALIZATION;  Surgeon: [name], MD;  Location: [facility];  Service: General;  Laterality: Left;  . COLONOSCOPY  12/23/10   internal  hemorrhoids/polyps in the recto-sigmoid colon, hyperplastic, TCS IN 5 years  . COLONOSCOPY N/A 06/29/2016   Procedure: COLONOSCOPY;  Surgeon: Danie Binder, MD;  Location[facility];  Service: Endoscopy;  Laterality: N/A;  10:30am  . PARTIAL HYSTERECTOMY  2010   uterine fibroids  . TUBAL LIGATION    . UPPER GASTROINTESTINAL ENDOSCOPY  Nov 2012   mild gastritis, patent esophageal ring s/p forceps dilation, chronic gastritis on path         Family History  Problem Relation Age of Onset  . Colon cancer Mother 36  . Colon cancer Brother 16  . Diabetes Brother     Social History       Tobacco Use  . Smoking status: Never Smoker  . Smokeless tobacco: Never Used  Substance Use Topics  . Alcohol use: No  . Drug use: No    Medications: I have reviewed the patient's current medications. Allergies as of 07/08/2017   No Known Allergies              Medication List            Accurate as of 07/08/17  4:25 PM. Always use your most recent med list.           ferrous sulfate 325 (65 FE) MG tablet Take 325 mg by mouth daily with  breakfast.   linaclotide 72 MCG capsule Commonly known as:  LINZESS Take 1 capsule (72 mcg total) by mouth daily before breakfast. On an empty stomach   meloxicam 7.5 MG tablet Commonly known as:  MOBIC Take 1 tablet (7.5 mg total) by mouth daily.   metFORMIN 500 MG 24 hr tablet Commonly known as:  GLUCOPHAGE-XR Take 500 mg by mouth at bedtime.   multivitamin capsule Take 1 capsule by mouth every other day.   omeprazole 20 MG capsule Commonly known as:  PRILOSEC TAKE 1 CAPSULE DAILY.   ondansetron 8 MG tablet Commonly known as:  ZOFRAN Take 1 tablet (8 mg total) by mouth every 8 (eight) hours as needed for nausea or vomiting.   pravastatin 40 MG tablet Commonly known as:  PRAVACHOL Take 40 mg by mouth at bedtime.   tamoxifen 20 MG tablet Commonly known as:  NOLVADEX Take 1 tablet (20 mg total) by  mouth daily.        ROS:  A comprehensive review of systems was negative except for: Cardiovascular: positive for HTN Gastrointestinal: positive for abdominal pain and reflux symptoms Genitourinary: positive for frequency Musculoskeletal: positive for back pain, neck pain and stiff joints  Blood pressure (!) 171/90, pulse 86, temperature 98 F (36.7 C), temperature source Temporal, resp. rate 18, weight 176 lb 8 oz (80.1 kg). Physical Exam  Constitutional: She is oriented to person, place, and time. She appears well-developed.  HENT:  Head: Normocephalic.  Eyes: Pupils are equal, round, and reactive to light.  Cardiovascular: Normal rate and regular rhythm.  Pulmonary/Chest: Effort normal and breath sounds normal.  Abdominal: Normal appearance and bowel sounds are normal. There is no tenderness. There is no rigidity, no rebound and no guarding. A hernia is present.  Umbilical hernia, somewhat tender, reducible   Neurological: She is alert and oriented to person, place, and time.  Skin: Skin is warm and dry.  Psychiatric: She has a normal mood and affect. Her behavior is normal.  Vitals reviewed.   Results: [place] 06/2017 IMPRESSION: Multiple gallstones in the gallbladder as seen previously. No sonographic evidence of cholecystitis or obstruction however.  Nonobstructing 6 mm stone in the right kidney.  Echogenic liver suggesting fatty change.  LFTs wnl, lipase 31, Alk phos 34, AST/ ALT 24/25, T bili 0.6  Assessment & Plan:  Kerri Shah is a 57 y.o. female with possible symptomatic choleliathiasis based on her symptoms when she went to the ED, and possible other pain and nausea and burping that could be explained by stones.  She has not had any other issues since that episode.  She does have an umbilical hernia that she has had for some time.   All questions were answered to the satisfaction of the patient\.  PLAN: I counseled the patient about the  indication, risks and benefits of laparoscopic cholecystectomy.  She understands there is a very small chance for bleeding, infection, injury to normal structures (including common bile duct), conversion to open surgery, persistent symptoms, evolution of postcholecystectomy diarrhea, need for secondary interventions, anesthesia reaction, cardiopulmonary issues and other risks not specifically detailed here. I described the expected recovery, the plan for follow-up and the restrictions during the recovery phase.  All questions were answered. Discussed that some of her symptoms of burping and nausea may not be related to the gallbladder and could have some other etiology.   -Patient will decide if she wants to proceed with Laparoscopic Cholecystectomy in the upcoming weeks     Ria Comment  C Katieann Hungate 07/08/2017, 4:25 PM

## 2017-07-21 NOTE — Patient Instructions (Signed)
Kerri Shah  07/21/2017     @PREFPERIOPPHARMACY @   Your procedure is scheduled on  07/30/2017 .  Report to [facility] at  650   A.M.  Call this number if you have problems the morning of surgery:  [phone]   Remember:  Do not eat or drink after midnight.  You may drink clear liquids until  12 midnight 07/29/2017 .  Clear liquids allowed are:                    Water, Juice (non-citric and without pulp), Carbonated beverages, Clear Tea, Black Coffee only, Plain Jell-O only, Gatorade and Plain Popsicles only    Take these medicines the morning of surgery with A SIP OF WATER  Lisinopril, zofran.    Do not wear jewelry, make-up or nail polish.  Do not wear lotions, powders, or perfumes, or deodorant.  Do not shave 48 hours prior to surgery.  Men may shave face and neck.  Do not bring valuables to the hospital.  [facility] is not responsible for any belongings or valuables.  Contacts, dentures or bridgework may not be worn into surgery.  Leave your suitcase in the car.  After surgery it may be brought to your room.  For patients admitted to the hospital, discharge time will be determined by your treatment team.  Patients discharged the day of surgery will not be allowed to drive home.   Name and phone number of your driver:   family Special instructions:  None  Please read over the following fact sheets that you were given. Anesthesia Post-op Instructions and Care and Recovery After Surgery       Laparoscopic Cholecystectomy Laparoscopic cholecystectomy is surgery to remove the gallbladder. The gallbladder is a pear-shaped organ that lies beneath the liver on the right side of the body. The gallbladder stores bile, which is a fluid that helps the body to digest fats. Cholecystectomy is often done for inflammation of the gallbladder (cholecystitis). This condition is usually caused by a buildup of gallstones (cholelithiasis) in the gallbladder. Gallstones  can block the flow of bile, which can result in inflammation and pain. In severe cases, emergency surgery may be required. This procedure is done though small incisions in your abdomen (laparoscopic surgery). A thin scope with a camera (laparoscope) is inserted through one incision. Thin surgical instruments are inserted through the other incisions. In some cases, a laparoscopic procedure may be turned into a type of surgery that is done through a larger incision (open surgery). Tell a health care provider about:  Any allergies you have.  All medicines you are taking, including vitamins, herbs, eye drops, creams, and over-the-counter medicines.  Any problems you or family members have had with anesthetic medicines.  Any blood disorders you have.  Any surgeries you have had.  Any medical conditions you have.  Whether you are pregnant or may be pregnant. What are the risks? Generally, this is a safe procedure. However, problems may occur, including:  Infection.  Bleeding.  Allergic reactions to medicines.  Damage to other structures or organs.  A stone remaining in the common bile duct. The common bile duct carries bile from the gallbladder into the small intestine.  A bile leak from the cyst duct that is clipped when your gallbladder is removed.  What happens before the procedure? Staying hydrated Follow instructions from your health care provider about hydration, which may include:  Up  to 2 hours before the procedure - you may continue to drink clear liquids, such as water, clear fruit juice, black coffee, and plain tea.  Eating and drinking restrictions Follow instructions from your health care provider about eating and drinking, which may include:  8 hours before the procedure - stop eating heavy meals or foods such as meat, fried foods, or fatty foods.  6 hours before the procedure - stop eating light meals or foods, such as toast or cereal.  6 hours before the  procedure - stop drinking milk or drinks that contain milk.  2 hours before the procedure - stop drinking clear liquids.  Medicines  Ask your health care provider about: ? Changing or stopping your regular medicines. This is especially important if you are taking diabetes medicines or blood thinners. ? Taking medicines such as aspirin and ibuprofen. These medicines can thin your blood. Do not take these medicines before your procedure if your health care provider instructs you not to.  You may be given antibiotic medicine to help prevent infection. General instructions  Let your health care provider know if you develop a cold or an infection before surgery.  Plan to have someone take you home from the hospital or clinic.  Ask your health care provider how your surgical site will be marked or identified. What happens during the procedure?  To reduce your risk of infection: ? Your health care team will wash or sanitize their hands. ? Your skin will be washed with soap. ? Hair may be removed from the surgical area.  An IV tube may be inserted into one of your veins.  You will be given one or more of the following: ? A medicine to help you relax (sedative). ? A medicine to make you fall asleep (general anesthetic).  A breathing tube will be placed in your mouth.  Your surgeon will make several small cuts (incisions) in your abdomen.  The laparoscope will be inserted through one of the small incisions. The camera on the laparoscope will send images to a TV screen (monitor) in the operating room. This lets your surgeon see inside your abdomen.  Air-like gas will be pumped into your abdomen. This will expand your abdomen to give the surgeon more room to perform the surgery.  Other tools that are needed for the procedure will be inserted through the other incisions. The gallbladder will be removed through one of the incisions.  Your common bile duct may be examined. If stones are  found in the common bile duct, they may be removed.  After your gallbladder has been removed, the incisions will be closed with stitches (sutures), staples, or skin glue.  Your incisions may be covered with a bandage (dressing). The procedure may vary among health care providers and hospitals. What happens after the procedure?  Your blood pressure, heart rate, breathing rate, and blood oxygen level will be monitored until the medicines you were given have worn off.  You will be given medicines as needed to control your pain.  Do not drive for 24 hours if you were given a sedative. This information is not intended to replace advice given to you by your health care provider. Make sure you discuss any questions you have with your health care provider. Document Released: 01/26/2005 Document Revised: 08/18/2015 Document Reviewed: 07/15/2015 Elsevier Interactive Patient Education  2018 [employer].  Laparoscopic Cholecystectomy, Care After This sheet gives you information about how to care for yourself after your procedure. Your health  care provider may also give you more specific instructions. If you have problems or questions, contact your health care provider. What can I expect after the procedure? After the procedure, it is common to have:  Pain at your incision sites. You will be given medicines to control this pain.  Mild nausea or vomiting.  Bloating and possible shoulder pain from the air-like gas that was used during the procedure.  Follow these instructions at home: Incision care   Follow instructions from your health care provider about how to take care of your incisions. Make sure you: ? Wash your hands with soap and water before you change your bandage (dressing). If soap and water are not available, use hand sanitizer. ? Change your dressing as told by your health care provider. ? Leave stitches (sutures), skin glue, or adhesive strips in place. These skin closures may  need to be in place for 2 weeks or longer. If adhesive strip edges start to loosen and curl up, you may trim the loose edges. Do not remove adhesive strips completely unless your health care provider tells you to do that.  Do not take baths, swim, or use a hot tub until your health care provider approves. Ask your health care provider if you can take showers. You may only be allowed to take sponge baths for bathing.  Check your incision area every day for signs of infection. Check for: ? More redness, swelling, or pain. ? More fluid or blood. ? Warmth. ? Pus or a bad smell. Activity  Do not drive or use heavy machinery while taking prescription pain medicine.  Do not lift anything that is heavier than 10 lb (4.5 kg) until your health care provider approves.  Do not play contact sports until your health care provider approves.  Do not drive for 24 hours if you were given a medicine to help you relax (sedative).  Rest as needed. Do not return to work or school until your health care provider approves. General instructions  Take over-the-counter and prescription medicines only as told by your health care provider.  To prevent or treat constipation while you are taking prescription pain medicine, your health care provider may recommend that you: ? Drink enough fluid to keep your urine clear or pale yellow. ? Take over-the-counter or prescription medicines. ? Eat foods that are high in fiber, such as fresh fruits and vegetables, whole grains, and beans. ? Limit foods that are high in fat and processed sugars, such as fried and sweet foods. Contact a health care provider if:  You develop a rash.  You have more redness, swelling, or pain around your incisions.  You have more fluid or blood coming from your incisions.  Your incisions feel warm to the touch.  You have pus or a bad smell coming from your incisions.  You have a fever.  One or more of your incisions breaks open. Get  help right away if:  You have trouble breathing.  You have chest pain.  You have increasing pain in your shoulders.  You faint or feel dizzy when you stand.  You have severe pain in your abdomen.  You have nausea or vomiting that lasts for more than one day.  You have leg pain. This information is not intended to replace advice given to you by your health care provider. Make sure you discuss any questions you have with your health care provider. Document Released: 01/26/2005 Document Revised: 08/17/2015 Document Reviewed: 07/15/2015 Elsevier Interactive Patient Education  2018 [employer] Anesthesia, Adult General anesthesia is the use of medicines to make a person "go to sleep" (be unconscious) for a medical procedure. General anesthesia is often recommended when a procedure:  Is long.  Requires you to be still or in an unusual position.  Is major and can cause you to lose blood.  Is impossible to do without general anesthesia.  The medicines used for general anesthesia are called general anesthetics. In addition to making you sleep, the medicines:  Prevent pain.  Control your blood pressure.  Relax your muscles.  Tell a health care provider about:  Any allergies you have.  All medicines you are taking, including vitamins, herbs, eye drops, creams, and over-the-counter medicines.  Any problems you or family members have had with anesthetic medicines.  Types of anesthetics you have had in the past.  Any bleeding disorders you have.  Any surgeries you have had.  Any medical conditions you have.  Any history of heart or lung conditions, such as heart failure, sleep apnea, or chronic obstructive pulmonary disease (COPD).  Whether you are pregnant or may be pregnant.  Whether you use tobacco, alcohol, marijuana, or street drugs.  Any history of Armed forces logistics/support/administrative officer.  Any history of depression or anxiety. What are the risks? Generally, this is a safe  procedure. However, problems may occur, including:  Allergic reaction to anesthetics.  Lung and heart problems.  Inhaling food or liquids from your stomach into your lungs (aspiration).  Injury to nerves.  Waking up during your procedure and being unable to move (rare).  Extreme agitation or a state of mental confusion (delirium) when you wake up from the anesthetic.  Air in the bloodstream, which can lead to stroke.  These problems are more likely to develop if you are having a major surgery or if you have an advanced medical condition. You can prevent some of these complications by answering all of your health care provider's questions thoroughly and by following all pre-procedure instructions. General anesthesia can cause side effects, including:  Nausea or vomiting  A sore throat from the breathing tube.  Feeling cold or shivery.  Feeling tired, washed out, or achy.  Sleepiness or drowsiness.  Confusion or agitation.  What happens before the procedure? Staying hydrated Follow instructions from your health care provider about hydration, which may include:  Up to 2 hours before the procedure - you may continue to drink clear liquids, such as water, clear fruit juice, black coffee, and plain tea.  Eating and drinking restrictions Follow instructions from your health care provider about eating and drinking, which may include:  8 hours before the procedure - stop eating heavy meals or foods such as meat, fried foods, or fatty foods.  6 hours before the procedure - stop eating light meals or foods, such as toast or cereal.  6 hours before the procedure - stop drinking milk or drinks that contain milk.  2 hours before the procedure - stop drinking clear liquids.  Medicines  Ask your health care provider about: ? Changing or stopping your regular medicines. This is especially important if you are taking diabetes medicines or blood thinners. ? Taking medicines such as  aspirin and ibuprofen. These medicines can thin your blood. Do not take these medicines before your procedure if your health care provider instructs you not to. ? Taking new dietary supplements or medicines. Do not take these during the week before your procedure unless your health care provider approves them.  If you are told to take a medicine or to continue taking a medicine on the day of the procedure, take the medicine with sips of water. General instructions   Ask if you will be going home the same day, the following day, or after a longer hospital stay. ? Plan to have someone take you home. ? Plan to have someone stay with you for the first 24 hours after you leave the hospital or clinic.  For 3-6 weeks before the procedure, try not to use any tobacco products, such as cigarettes, chewing tobacco, and e-cigarettes.  You may brush your teeth on the morning of the procedure, but make sure to spit out the toothpaste. What happens during the procedure?  You will be given anesthetics through a mask and through an IV tube in one of your veins.  You may receive medicine to help you relax (sedative).  As soon as you are asleep, a breathing tube may be used to help you breathe.  An anesthesia specialist will stay with you throughout the procedure. He or she will help keep you comfortable and safe by continuing to give you medicines and adjusting the amount of medicine that you get. He or she will also watch your blood pressure, pulse, and oxygen levels to make sure that the anesthetics do not cause any problems.  If a breathing tube was used to help you breathe, it will be removed before you wake up. The procedure may vary among health care providers and hospitals. What happens after the procedure?  You will wake up, often slowly, after the procedure is complete, usually in a recovery area.  Your blood pressure, heart rate, breathing rate, and blood oxygen level will be monitored until  the medicines you were given have worn off.  You may be given medicine to help you calm down if you feel anxious or agitated.  If you will be going home the same day, your health care provider may check to make sure you can stand, drink, and urinate.  Your health care providers will treat your pain and side effects before you go home.  Do not drive for 24 hours if you received a sedative.  You may: ? Feel nauseous and vomit. ? Have a sore throat. ? Have mental slowness. ? Feel cold or shivery. ? Feel sleepy. ? Feel tired. ? Feel sore or achy, even in parts of your body where you did not have surgery. This information is not intended to replace advice given to you by your health care provider. Make sure you discuss any questions you have with your health care provider. Document Released: 05/05/2007 Document Revised: 07/09/2015 Document Reviewed: 01/10/2015 Elsevier Interactive Patient Education  2018 [place] Anesthesia, Adult, Care After These instructions provide you with information about caring for yourself after your procedure. Your health care provider may also give you more specific instructions. Your treatment has been planned according to current medical practices, but problems sometimes occur. Call your health care provider if you have any problems or questions after your procedure. What can I expect after the procedure? After the procedure, it is common to have:  Vomiting.  A sore throat.  Mental slowness.  It is common to feel:  Nauseous.  Cold or shivery.  Sleepy.  Tired.  Sore or achy, even in parts of your body where you did not have surgery.  Follow these instructions at home: For at least 24 hours after the procedure:  Do not: ? Participate  in activities where you could fall or become injured. ? Drive. ? Use heavy machinery. ? Drink alcohol. ? Take sleeping pills or medicines that cause drowsiness. ? Make important decisions or sign  legal documents. ? Take care of children on your own.  Rest. Eating and drinking  If you vomit, drink water, juice, or soup when you can drink without vomiting.  Drink enough fluid to keep your urine clear or pale yellow.  Make sure you have little or no nausea before eating solid foods.  Follow the diet recommended by your health care provider. General instructions  Have a responsible adult stay with you until you are awake and alert.  Return to your normal activities as told by your health care provider. Ask your health care provider what activities are safe for you.  Take over-the-counter and prescription medicines only as told by your health care provider.  If you smoke, do not smoke without supervision.  Keep all follow-up visits as told by your health care provider. This is important. Contact a health care provider if:  You continue to have nausea or vomiting at home, and medicines are not helpful.  You cannot drink fluids or start eating again.  You cannot urinate after 8-12 hours.  You develop a skin rash.  You have fever.  You have increasing redness at the site of your procedure. Get help right away if:  You have difficulty breathing.  You have chest pain.  You have unexpected bleeding.  You feel that you are having a life-threatening or urgent problem. This information is not intended to replace advice given to you by your health care provider. Make sure you discuss any questions you have with your health care provider. Document Released: 05/04/2000 Document Revised: 07/01/2015 Document Reviewed: 01/10/2015 Elsevier Interactive Patient Education  [employer].

## 2017-07-26 ENCOUNTER — Encounter (HOSPITAL_COMMUNITY)
Admission: RE | Admit: 2017-07-26 | Discharge: 2017-07-26 | Disposition: A | Discharge status: Home / Self Care | Payer: BLUE CROSS/BLUE SHIELD | Source: Ambulatory Visit | Attending: General Surgery | Admitting: General Surgery

## 2017-07-26 ENCOUNTER — Encounter (HOSPITAL_COMMUNITY): Payer: Self-pay

## 2017-07-26 DIAGNOSIS — K8020 Calculus of gallbladder without cholecystitis without obstruction: Secondary | ICD-10-CM | POA: Diagnosis not present

## 2017-07-26 DIAGNOSIS — Z01810 Encounter for preprocedural cardiovascular examination: Secondary | ICD-10-CM | POA: Diagnosis present

## 2017-07-26 DIAGNOSIS — Z01818 Encounter for other preprocedural examination: Secondary | ICD-10-CM | POA: Insufficient documentation

## 2017-07-26 HISTORY — DX: Personal history of urinary calculi: Z87.442

## 2017-07-26 HISTORY — DX: Essential (primary) hypertension: I10

## 2017-07-26 LAB — HEMOGLOBIN A1C
Hgb A1c MFr Bld: 7.5 % — ABNORMAL HIGH (ref 4.8–5.6)
MEAN PLASMA GLUCOSE: 168.55 mg/dL

## 2017-07-26 LAB — GLUCOSE, CAPILLARY: Glucose-Capillary: 118 mg/dL — ABNORMAL HIGH (ref 65–99)

## 2017-07-26 NOTE — Pre-Procedure Instructions (Signed)
HgbA1C routed to PCP. 

## 2017-07-30 ENCOUNTER — Encounter (HOSPITAL_COMMUNITY)
Admission: RE | Disposition: A | Discharge status: Home / Self Care | Payer: Self-pay | Source: Ambulatory Visit | Attending: General Surgery

## 2017-07-30 ENCOUNTER — Ambulatory Visit (HOSPITAL_COMMUNITY)
Admission: RE | Admit: 2017-07-30 | Discharge: 2017-07-30 | Disposition: A | Discharge status: Home / Self Care | Payer: BLUE CROSS/BLUE SHIELD | Source: Ambulatory Visit | Attending: General Surgery | Admitting: General Surgery

## 2017-07-30 ENCOUNTER — Encounter (HOSPITAL_COMMUNITY): Payer: Self-pay | Admitting: *Deleted

## 2017-07-30 ENCOUNTER — Ambulatory Visit (HOSPITAL_COMMUNITY): Payer: BLUE CROSS/BLUE SHIELD | Admitting: Anesthesiology

## 2017-07-30 DIAGNOSIS — Z791 Long term (current) use of non-steroidal anti-inflammatories (NSAID): Secondary | ICD-10-CM | POA: Insufficient documentation

## 2017-07-30 DIAGNOSIS — K8020 Calculus of gallbladder without cholecystitis without obstruction: Secondary | ICD-10-CM | POA: Diagnosis not present

## 2017-07-30 DIAGNOSIS — I10 Essential (primary) hypertension: Secondary | ICD-10-CM | POA: Diagnosis not present

## 2017-07-30 DIAGNOSIS — K660 Peritoneal adhesions (postprocedural) (postinfection): Secondary | ICD-10-CM | POA: Insufficient documentation

## 2017-07-30 DIAGNOSIS — E7800 Pure hypercholesterolemia, unspecified: Secondary | ICD-10-CM | POA: Insufficient documentation

## 2017-07-30 DIAGNOSIS — K219 Gastro-esophageal reflux disease without esophagitis: Secondary | ICD-10-CM | POA: Diagnosis not present

## 2017-07-30 DIAGNOSIS — Z79899 Other long term (current) drug therapy: Secondary | ICD-10-CM | POA: Diagnosis not present

## 2017-07-30 DIAGNOSIS — Z7984 Long term (current) use of oral hypoglycemic drugs: Secondary | ICD-10-CM | POA: Insufficient documentation

## 2017-07-30 DIAGNOSIS — K429 Umbilical hernia without obstruction or gangrene: Secondary | ICD-10-CM | POA: Insufficient documentation

## 2017-07-30 DIAGNOSIS — K8010 Calculus of gallbladder with chronic cholecystitis without obstruction: Secondary | ICD-10-CM | POA: Diagnosis not present

## 2017-07-30 DIAGNOSIS — E119 Type 2 diabetes mellitus without complications: Secondary | ICD-10-CM | POA: Insufficient documentation

## 2017-07-30 DIAGNOSIS — Z9851 Tubal ligation status: Secondary | ICD-10-CM | POA: Insufficient documentation

## 2017-07-30 DIAGNOSIS — Z923 Personal history of irradiation: Secondary | ICD-10-CM | POA: Diagnosis not present

## 2017-07-30 HISTORY — PX: CHOLECYSTECTOMY: SHX55

## 2017-07-30 LAB — GLUCOSE, CAPILLARY
GLUCOSE-CAPILLARY: 141 mg/dL — AB (ref 65–99)
Glucose-Capillary: 126 mg/dL — ABNORMAL HIGH (ref 65–99)

## 2017-07-30 SURGERY — LAPAROSCOPIC CHOLECYSTECTOMY
Anesthesia: General

## 2017-07-30 MED ORDER — SODIUM CHLORIDE 0.9 % IR SOLN
Status: DC | PRN
  Administered 2017-07-30: 1000 mL

## 2017-07-30 MED ORDER — LIDOCAINE HCL 1 % IJ SOLN
INTRAMUSCULAR | Status: DC | PRN
  Administered 2017-07-30: 25 mg via INTRADERMAL

## 2017-07-30 MED ORDER — ROCURONIUM BROMIDE 100 MG/10ML IV SOLN
INTRAVENOUS | Status: DC | PRN
  Administered 2017-07-30: 5 mg via INTRAVENOUS
  Administered 2017-07-30: 25 mg via INTRAVENOUS

## 2017-07-30 MED ORDER — BUPIVACAINE HCL (PF) 0.5 % IJ SOLN
INTRAMUSCULAR | Status: AC
  Filled 2017-07-30: qty 30

## 2017-07-30 MED ORDER — PROPOFOL 10 MG/ML IV BOLUS
INTRAVENOUS | Status: DC | PRN
  Administered 2017-07-30: 150 mg via INTRAVENOUS

## 2017-07-30 MED ORDER — KETOROLAC TROMETHAMINE 30 MG/ML IJ SOLN
30.0000 mg | Freq: Once | INTRAMUSCULAR | Status: AC
  Administered 2017-07-30: 30 mg via INTRAVENOUS
  Filled 2017-07-30: qty 1

## 2017-07-30 MED ORDER — DOCUSATE SODIUM 100 MG PO CAPS: 100.0000 mg | ORAL_CAPSULE | Freq: Two times a day (BID) | ORAL | 2 refills | Status: DC

## 2017-07-30 MED ORDER — LACTATED RINGERS IV SOLN
INTRAVENOUS | Status: DC
  Administered 2017-07-30: 10:00:00 via INTRAVENOUS

## 2017-07-30 MED ORDER — FENTANYL CITRATE (PF) 100 MCG/2ML IJ SOLN
INTRAMUSCULAR | Status: AC
  Filled 2017-07-30: qty 2

## 2017-07-30 MED ORDER — FENTANYL CITRATE (PF) 100 MCG/2ML IJ SOLN
INTRAMUSCULAR | Status: DC | PRN
  Administered 2017-07-30 (×3): 50 ug via INTRAVENOUS

## 2017-07-30 MED ORDER — SUGAMMADEX SODIUM 200 MG/2ML IV SOLN
INTRAVENOUS | Status: DC | PRN
  Administered 2017-07-30: 200 mg via INTRAVENOUS

## 2017-07-30 MED ORDER — CHLORHEXIDINE GLUCONATE CLOTH 2 % EX PADS: 6.0000 | MEDICATED_PAD | Freq: Once | CUTANEOUS | Status: DC

## 2017-07-30 MED ORDER — FENTANYL CITRATE (PF) 100 MCG/2ML IJ SOLN
25.0000 ug | INTRAMUSCULAR | Status: DC | PRN
  Administered 2017-07-30 (×2): 50 ug via INTRAVENOUS
  Filled 2017-07-30: qty 2

## 2017-07-30 MED ORDER — SUCCINYLCHOLINE CHLORIDE 20 MG/ML IJ SOLN
INTRAMUSCULAR | Status: DC | PRN
  Administered 2017-07-30: 140 mg via INTRAVENOUS

## 2017-07-30 MED ORDER — SCOPOLAMINE 1 MG/3DAYS TD PT72
1.0000 | MEDICATED_PATCH | TRANSDERMAL | Status: DC
  Administered 2017-07-30: 1.5 mg via TRANSDERMAL

## 2017-07-30 MED ORDER — ONDANSETRON HCL 4 MG/2ML IJ SOLN
INTRAMUSCULAR | Status: DC | PRN
  Administered 2017-07-30: 4 mg via INTRAVENOUS

## 2017-07-30 MED ORDER — LACTATED RINGERS IV SOLN
INTRAVENOUS | Status: DC | PRN
  Administered 2017-07-30: 1000 mL
  Administered 2017-07-30: 08:00:00 via INTRAVENOUS

## 2017-07-30 MED ORDER — MIDAZOLAM HCL 2 MG/2ML IJ SOLN
INTRAMUSCULAR | Status: AC
  Filled 2017-07-30: qty 2

## 2017-07-30 MED ORDER — SCOPOLAMINE 1 MG/3DAYS TD PT72
MEDICATED_PATCH | TRANSDERMAL | Status: AC
  Filled 2017-07-30: qty 1

## 2017-07-30 MED ORDER — OXYCODONE HCL 5 MG PO TABS: 5.0000 mg | ORAL_TABLET | ORAL | 0 refills | Status: DC | PRN

## 2017-07-30 MED ORDER — CEFAZOLIN SODIUM-DEXTROSE 2-4 GM/100ML-% IV SOLN
2.0000 g | INTRAVENOUS | Status: AC
  Administered 2017-07-30: 2 g via INTRAVENOUS
  Filled 2017-07-30: qty 100

## 2017-07-30 MED ORDER — HEMOSTATIC AGENTS (NO CHARGE) OPTIME
TOPICAL | Status: DC | PRN
  Administered 2017-07-30: 1 via TOPICAL

## 2017-07-30 MED ORDER — BUPIVACAINE HCL (PF) 0.5 % IJ SOLN
INTRAMUSCULAR | Status: DC | PRN
  Administered 2017-07-30: 10 mL

## 2017-07-30 MED ORDER — PROPOFOL 10 MG/ML IV BOLUS
INTRAVENOUS | Status: AC
  Filled 2017-07-30: qty 20

## 2017-07-30 MED ORDER — HYDROCODONE-ACETAMINOPHEN 7.5-325 MG PO TABS: 1.0000 | ORAL_TABLET | Freq: Once | ORAL | Status: DC | PRN

## 2017-07-30 MED ORDER — MIDAZOLAM HCL 5 MG/5ML IJ SOLN
INTRAMUSCULAR | Status: DC | PRN
  Administered 2017-07-30: 2 mg via INTRAVENOUS

## 2017-07-30 SURGICAL SUPPLY — 45 items
APPLIER CLIP ROT 10 11.4 M/L (STAPLE) ×3
BAG RETRIEVAL 10 (BASKET) ×1
BAG RETRIEVAL 10MM (BASKET) ×1
BLADE SURG 15 STRL LF DISP TIS (BLADE) ×1 IMPLANT
BLADE SURG 15 STRL SS (BLADE) ×2
CHLORAPREP W/TINT 26ML (MISCELLANEOUS) ×3 IMPLANT
CLIP APPLIE ROT 10 11.4 M/L (STAPLE) ×1 IMPLANT
CLOTH BEACON ORANGE TIMEOUT ST (SAFETY) ×3 IMPLANT
COVER LIGHT HANDLE STERIS (MISCELLANEOUS) ×6 IMPLANT
DECANTER SPIKE VIAL GLASS SM (MISCELLANEOUS) ×3 IMPLANT
DERMABOND ADVANCED (GAUZE/BANDAGES/DRESSINGS) ×2
DERMABOND ADVANCED .7 DNX12 (GAUZE/BANDAGES/DRESSINGS) ×1 IMPLANT
ELECT REM PT RETURN 9FT ADLT (ELECTROSURGICAL) ×3
ELECTRODE REM PT RTRN 9FT ADLT (ELECTROSURGICAL) ×1 IMPLANT
FILTER SMOKE EVAC LAPAROSHD (FILTER) ×3 IMPLANT
GLOVE BIO SURGEON STRL SZ 6.5 (GLOVE) ×2 IMPLANT
GLOVE BIO SURGEONS STRL SZ 6.5 (GLOVE) ×1
GLOVE BIOGEL PI IND STRL 6.5 (GLOVE) ×2 IMPLANT
GLOVE BIOGEL PI IND STRL 7.0 (GLOVE) ×3 IMPLANT
GLOVE BIOGEL PI INDICATOR 6.5 (GLOVE) ×4
GLOVE BIOGEL PI INDICATOR 7.0 (GLOVE) ×6
GLOVE SURG SS PI 6.5 STRL IVOR (GLOVE) ×3 IMPLANT
GLOVE SURG SS PI 7.5 STRL IVOR (GLOVE) ×3 IMPLANT
GOWN STRL REUS W/TWL LRG LVL3 (GOWN DISPOSABLE) ×9 IMPLANT
HEMOSTAT SNOW SURGICEL 2X4 (HEMOSTASIS) ×3 IMPLANT
INST SET LAPROSCOPIC AP (KITS) ×3 IMPLANT
KIT TURNOVER KIT A (KITS) ×3 IMPLANT
MANIFOLD NEPTUNE II (INSTRUMENTS) ×3 IMPLANT
NEEDLE INSUFFLATION 14GA 120MM (NEEDLE) ×3 IMPLANT
NS IRRIG 1000ML POUR BTL (IV SOLUTION) ×3 IMPLANT
PACK LAP CHOLE LZT030E (CUSTOM PROCEDURE TRAY) ×3 IMPLANT
PAD ARMBOARD 7.5X6 YLW CONV (MISCELLANEOUS) ×3 IMPLANT
SET BASIN LINEN APH (SET/KITS/TRAYS/PACK) ×3 IMPLANT
SLEEVE ENDOPATH XCEL 5M (ENDOMECHANICALS) ×3 IMPLANT
SUT MNCRL AB 4-0 PS2 18 (SUTURE) ×6 IMPLANT
SUT VICRYL 0 UR6 27IN ABS (SUTURE) ×3 IMPLANT
SYS BAG RETRIEVAL 10MM (BASKET) ×1
SYSTEM BAG RETRIEVAL 10MM (BASKET) ×1 IMPLANT
TROCAR ENDO BLADELESS 11MM (ENDOMECHANICALS) ×3 IMPLANT
TROCAR XCEL NON-BLD 5MMX100MML (ENDOMECHANICALS) ×3 IMPLANT
TROCAR XCEL UNIV SLVE 11M 100M (ENDOMECHANICALS) ×3 IMPLANT
TUBE CONNECTING 12'X1/4 (SUCTIONS) ×1
TUBE CONNECTING 12X1/4 (SUCTIONS) ×2 IMPLANT
TUBING INSUFFLATION (TUBING) ×3 IMPLANT
WARMER LAPAROSCOPE (MISCELLANEOUS) ×3 IMPLANT

## 2017-07-30 NOTE — Anesthesia Preprocedure Evaluation (Signed)
Anesthesia Evaluation    History of Anesthesia Complications (+) PONV  Airway Mallampati: II  TM Distance: >3 FB Neck ROM: Full    Dental no notable dental hx.    Pulmonary    Pulmonary exam normal breath sounds clear to auscultation       Cardiovascular Exercise Tolerance: Good hypertension, Normal cardiovascular examI Rhythm:Regular Rate:Normal     Neuro/Psych    GI/Hepatic GERD  Medicated,  Endo/Other  diabetes, Type 2, Oral Hypoglycemic Agents  Renal/GU      Musculoskeletal  (+) Arthritis , Osteoarthritis,    Abdominal   Peds  Hematology   Anesthesia Other Findings H/o L breast Ca -s/p lumpectomy without Chemo -was given RT  Reproductive/Obstetrics                             Anesthesia Physical Anesthesia Plan  ASA: II  Anesthesia Plan: General   Post-op Pain Management:    Induction: Intravenous  PONV Risk Score and Plan:   Airway Management Planned: Oral ETT  Additional Equipment:   Intra-op Plan:   Post-operative Plan: Extubation in OR  Informed Consent: I have reviewed the patients History and Physical, chart, labs and discussed the procedure including the risks, benefits and alternatives for the proposed anesthesia with the patient or authorized representative who has indicated his/her understanding and acceptance.   Dental advisory given  Plan Discussed with: CRNA  Anesthesia Plan Comments:         Anesthesia Quick Evaluation

## 2017-07-30 NOTE — Transfer of Care (Signed)
Immediate Anesthesia Transfer of Care Note  Patient: Kerri Shah  Procedure(s) Performed: LAPAROSCOPIC CHOLECYSTECTOMY (N/A )  Patient Location: PACU  Anesthesia Type:General  Level of Consciousness: drowsy and patient cooperative  Airway & Oxygen Therapy: Patient Spontanous Breathing  Post-op Assessment: Report given to RN and Post -op Vital signs reviewed and stable  Post vital signs: Reviewed and stable  Last Vitals:  Vitals Value Taken Time  BP    Temp    Pulse    Resp    SpO2      Last Pain:  Vitals:   07/30/17 0750  TempSrc: Oral  PainSc: 0-No pain         Complications: No apparent anesthesia complications

## 2017-07-30 NOTE — Interval H&P Note (Signed)
History and Physical Interval Note:  07/30/2017 8:30 AM  [name]  has presented today for surgery, with the diagnosis of cholelithiasis  The various methods of treatment have been discussed with the patient and family. After consideration of risks, benefits and other options for treatment, the patient has consented to  Procedure(s): LAPAROSCOPIC CHOLECYSTECTOMY (N/A) as a surgical intervention .  The patient's history has been reviewed, patient examined, no change in status, stable for surgery.  I have reviewed the patient's chart and labs.  Questions were answered to the patient's satisfaction.    No major questions. Worried about rib pain from prior radiation on right side. Do not think surgery will cause any issues, possibly sore from prolonged laying down. Recommended NSAIDs.   Virl Cagey

## 2017-07-30 NOTE — Op Note (Signed)
Operative Note   Preoperative Diagnosis: Symptomatic cholelithiasis, umbilical hernia    Postoperative Diagnosis: Same   Procedure(s) Performed: Laparoscopic cholecystectomy, primary repair umbilical hernia    Surgeon: Lanell Matar. Constance Haw, MD   Assistants: Aviva Signs, MD    Anesthesia: General endotracheal   Anesthesiologist: Lenice Llamas, MD    Specimens: Gallbladder    Estimated Blood Loss: Minimal    Blood Replacement: None    Complications: None    Operative Findings: Small gallbladder, omental adhesions to the anterior abdominal wall at umbilicus    Procedure: The patient was taken to the operating room and placed supine. General endotracheal anesthesia was induced. Intravenous antibiotics were administered per protocol. An orogastric tube positioned to decompress the stomach. The abdomen was prepared and draped in the usual sterile fashion.    A supraumbilical incision was made just above her umbilical hernia and a Veress technique was utilized to achieve pneumoperitoneum to 15 mmHg with carbon dioxide. A 11 mm optiview port was placed through the hernia defect, and a 10 mm 0-degree operative laparoscope was introduced. There was some minimal omental adhesions.  The area underlying the trocar and Veress needle were inspected and without evidence of injury.  Remaining trocars were placed under direct vision. Two 5 mm ports were placed in the right abdomen, between the anterior axillary and midclavicular line.  A final 11 mm port was placed through the mid-epigastrium, near the falciform ligament.    The gallbladder fundus was elevated cephalad and the infundibulum was retracted to the patient's right. The gallbladder/cystic duct junction was skeletonized. The cystic artery noted in the triangle of Calot and was also skeletonized.  We then continued liberal medial and lateral dissection until the critical view of safety was achieved.    The cystic duct and cystic artery were  doubly clipped and divided. A posterior branch of the artery as clipped at the hepatic bed. The gallbladder was then dissected from the liver bed with electrocautery. The camera was then placed through the epigastric site, and a Ligasure device was used to take down the omental adhesions to the umbilical hernia. The hernia defect was the size of the trocar.  The specimen was placed in an Endopouch and was retrieved through the epigastric site.   Final inspection revealed acceptable hemostasis. Surgical Emogene Morgan was placed in the gallbladder bed. Trocars were removed and pneumoperitoneum was released. An 0 Vicryl fascial sutures were used to close the umbilical hernia primarily. The epigastric port site was at the rib edge and small.   Skin incisions were closed with 4-0 Monocryl subcuticular sutures and Dermabond. The patient was awakened from anesthesia and extubated without complication.    Curlene Labrum, MD [facility] [address] [place], [place] [address] [phone] (office)

## 2017-07-30 NOTE — Anesthesia Procedure Notes (Signed)
Procedure Name: Intubation Date/Time: 07/30/2017 8:52 AM Performed by: Charmaine Downs, CRNA Pre-anesthesia Checklist: Patient identified, Patient being monitored, Timeout performed, Emergency Drugs available and Suction available Patient Re-evaluated:Patient Re-evaluated prior to induction Oxygen Delivery Method: Circle System Utilized Preoxygenation: Pre-oxygenation with 100% oxygen Induction Type: IV induction Ventilation: Mask ventilation without difficulty Laryngoscope Size: Mac and 4 Grade View: Grade I Tube type: Oral Tube size: 7.0 mm Number of attempts: 1 Airway Equipment and Method: stylet Placement Confirmation: ETT inserted through vocal cords under direct vision,  positive ETCO2 and breath sounds checked- equal and bilateral Secured at: 22 cm Tube secured with: Tape Dental Injury: Teeth and Oropharynx as per pre-operative assessment

## 2017-07-30 NOTE — Discharge Instructions (Signed)
Discharge Instructions: Shower per your regular routine. Take tylenol and ibuprofen as needed for pain control, alternating every 4-6 hours.  Take Roxicodone for breakthrough pain. Take colace for constipation related to narcotic pain medication. Do not pick at the dermabond glue on your incision sites.   Laparoscopic Cholecystectomy, Care After This sheet gives you information about how to care for yourself after your procedure. Your doctor may also give you more specific instructions. If you have problems or questions, contact your doctor. Follow these instructions at home: Care for cuts from surgery (incisions)   Follow instructions from your doctor about how to take care of your cuts from surgery. Make sure you: ? Wash your hands with soap and water before you change your bandage (dressing). If you cannot use soap and water, use hand sanitizer. ? Change your bandage as told by your doctor. ? Leave stitches (sutures), skin glue, or skin tape (adhesive) strips in place. They may need to stay in place for 2 weeks or longer. If tape strips get loose and curl up, you may trim the loose edges. Do not remove tape strips completely unless your doctor says it is okay.  Do not take baths, swim, or use a hot tub until your doctor says it is okay.  Check your surgical cut area every day for signs of infection. Check for: ? More redness, swelling, or pain. ? More fluid or blood. ? Warmth. ? Pus or a bad smell. Activity  Do not drive or use heavy machinery while taking prescription pain medicine.  Do not lift anything that is heavier than 10 lb (4.5 kg) for 1 week.   Do not play contact sports until your doctor says it is okay.  Do not drive for 24 hours if you were given a medicine to help you relax (sedative).  Rest as needed. Do not return to work or school until your doctor says it is okay. General instructions  Take over-the-counter and prescription medicines only as told by your  doctor.  To prevent or treat constipation while you are taking prescription pain medicine, your doctor may recommend that you: ? Drink enough fluid to keep your pee (urine) clear or pale yellow. ? Take over-the-counter or prescription medicines. ? Eat foods that are high in fiber, such as fresh fruits and vegetables, whole grains, and beans. ? Limit foods that are high in fat and processed sugars, such as fried and sweet foods. Contact a doctor if:  You develop a rash.  You have more redness, swelling, or pain around your surgical cuts.  You have more fluid or blood coming from your surgical cuts.  Your surgical cuts feel warm to the touch.  You have pus or a bad smell coming from your surgical cuts.  You have a fever.  One or more of your surgical cuts breaks open. Get help right away if:  You have trouble breathing.  You have chest pain.  You have pain that is getting worse in your shoulders.  You faint or feel dizzy when you stand.  You have very bad pain in your belly (abdomen).  You are sick to your stomach (nauseous) for more than one day.  You have throwing up (vomiting) that lasts for more than one day.  You have leg pain. This information is not intended to replace advice given to you by your health care provider. Make sure you discuss any questions you have with your health care provider. Document Released: 11/05/2007 Document Revised: 08/17/2015  Document Reviewed: 07/15/2015 Elsevier Interactive Patient Education  [employer].

## 2017-07-30 NOTE — Anesthesia Postprocedure Evaluation (Signed)
Anesthesia Post Note  Patient: MIIA BLANKS  Procedure(s) Performed: LAPAROSCOPIC CHOLECYSTECTOMY (N/A )  Patient location during evaluation: PACU Anesthesia Type: General Level of consciousness: sedated and patient cooperative Pain management: pain level controlled Vital Signs Assessment: post-procedure vital signs reviewed and stable Respiratory status: spontaneous breathing, nonlabored ventilation and respiratory function stable Cardiovascular status: blood pressure returned to baseline Postop Assessment: no apparent nausea or vomiting Anesthetic complications: no     Last Vitals:  Vitals:   07/30/17 0835 07/30/17 0840  BP: (!) 159/81 (!) 145/81  Pulse:    Resp: (!) 28 16  Temp:    SpO2: 100% 100%    Last Pain:  Vitals:   07/30/17 0750  TempSrc: Oral  PainSc: 0-No pain                 Zakk Borgen J

## 2017-08-02 ENCOUNTER — Encounter (HOSPITAL_COMMUNITY): Payer: Self-pay | Admitting: General Surgery

## 2017-08-14 ENCOUNTER — Telehealth: Payer: Self-pay | Admitting: Gastroenterology

## 2017-08-14 NOTE — Telephone Encounter (Signed)
Reviewed records since ov here.  Microcytic anemia noted 12/2016 and persistent last month.  Cholecystectomy and umbilical hernia repair last month.   Would advise f/u here for microcytic anemia.  Recheck CBC, iron/tibc,ferritin in 2 months.

## 2017-08-16 ENCOUNTER — Encounter: Payer: Self-pay | Admitting: Gastroenterology

## 2017-08-16 ENCOUNTER — Other Ambulatory Visit: Payer: Self-pay

## 2017-08-16 DIAGNOSIS — D509 Iron deficiency anemia, unspecified: Secondary | ICD-10-CM

## 2017-08-16 NOTE — Telephone Encounter (Signed)
PATIENT SCHEDULED AND LETTER SENT  °

## 2017-08-16 NOTE — Telephone Encounter (Signed)
LMOM to call.

## 2017-08-16 NOTE — Telephone Encounter (Signed)
PT is aware. OK to schedule follow up. Lab orders on file for 2 months.

## 2017-08-19 ENCOUNTER — Encounter: Payer: Self-pay | Admitting: General Surgery

## 2017-08-19 ENCOUNTER — Ambulatory Visit (INDEPENDENT_AMBULATORY_CARE_PROVIDER_SITE_OTHER): Payer: Self-pay | Admitting: General Surgery

## 2017-08-19 VITALS — BP 132/81 | HR 99 | Temp 97.5°F | Resp 18 | Wt 166.0 lb

## 2017-08-19 DIAGNOSIS — K8020 Calculus of gallbladder without cholecystitis without obstruction: Secondary | ICD-10-CM

## 2017-08-19 NOTE — Progress Notes (Signed)
[facility] Note   HPI:  57 y.o. Female presents to clinic for post-op follow-up evaluation after a laparoscopic cholecystectomy and primary umbilical hernia repair. Patient reports she is well. She does have some intermittent nausea and is taking in smaller meals.  Review of Systems:  No fevers or chills Regular diet and BM. Just smaller meals.  All other review of systems: otherwise negative   Pathology: Diagnosis Gallbladder - CHRONIC CHOLECYSTITIS AND CHOLELITHIASIS WITH ADENOMYOSIS.  Vital Signs:  BP 132/81 (BP Location: Left Arm, Patient Position: Sitting, Cuff Size: Normal)   Pulse 99   Temp (!) 97.5 F (36.4 C) (Temporal)   Resp 18   Wt 166 lb (75.3 kg)   BMI 26.39 kg/m    Physical Exam:  Physical Exam  Constitutional: She appears well-developed.  HENT:  Head: Normocephalic.  Eyes: Pupils are equal, round, and reactive to light.  Cardiovascular: Normal rate and regular rhythm.  Pulmonary/Chest: Effort normal.  Abdominal: Soft. She exhibits no distension. There is no tenderness.  Port sites healed, umbilical hernia healing, no signs of bulging  Vitals reviewed.   Laboratory studies: None  Imaging:  None    Assessment:  57 y.o. yo Female s/p lap chole and primary suture repair of her umbilical hernia. Doing well.  Plan:  - No heavy lifting > 10 lbs for 6 weeks, hernia may re-occur due to primary closure and may need mesh if it does - Follow up PRN    All of the above recommendations were discussed with the patient, and all of patient's questions were answered to her expressed satisfaction.  Curlene Labrum, MD [facility] [address] [place], [place] [address] [phone] (office)

## 2017-09-21 ENCOUNTER — Other Ambulatory Visit: Payer: Self-pay

## 2017-09-21 DIAGNOSIS — D509 Iron deficiency anemia, unspecified: Secondary | ICD-10-CM

## 2017-10-19 LAB — CBC WITH DIFFERENTIAL/PLATELET
Basophils Absolute: 29 cells/uL (ref 0–200)
Basophils Relative: 1 %
EOS PCT: 1.4 %
Eosinophils Absolute: 41 cells/uL (ref 15–500)
HCT: 30.8 % — ABNORMAL LOW (ref 35.0–45.0)
HEMOGLOBIN: 9.7 g/dL — AB (ref 11.7–15.5)
Lymphs Abs: 835 cells/uL — ABNORMAL LOW (ref 850–3900)
MCH: 23.2 pg — ABNORMAL LOW (ref 27.0–33.0)
MCHC: 31.5 g/dL — ABNORMAL LOW (ref 32.0–36.0)
MCV: 73.7 fL — ABNORMAL LOW (ref 80.0–100.0)
MONOS PCT: 10.8 %
MPV: 10.6 fL (ref 7.5–12.5)
NEUTROS ABS: 1682 {cells}/uL (ref 1500–7800)
Neutrophils Relative %: 58 %
PLATELETS: 261 10*3/uL (ref 140–400)
RBC: 4.18 10*6/uL (ref 3.80–5.10)
RDW: 17 % — ABNORMAL HIGH (ref 11.0–15.0)
TOTAL LYMPHOCYTE: 28.8 %
WBC mixed population: 313 cells/uL (ref 200–950)
WBC: 2.9 10*3/uL — AB (ref 3.8–10.8)

## 2017-10-19 LAB — IRON,TIBC AND FERRITIN PANEL
%SAT: 23 % (calc) (ref 16–45)
Ferritin: 532 ng/mL — ABNORMAL HIGH (ref 16–232)
IRON: 83 ug/dL (ref 45–160)
TIBC: 363 ug/dL (ref 250–450)

## 2017-10-25 NOTE — Progress Notes (Signed)
Pt is aware. I am mailing the iFOBT to her and she will return here at the office.

## 2017-11-02 ENCOUNTER — Encounter: Payer: Self-pay | Admitting: Gastroenterology

## 2017-11-02 ENCOUNTER — Other Ambulatory Visit: Payer: Self-pay | Admitting: General Surgery

## 2017-11-02 ENCOUNTER — Other Ambulatory Visit: Payer: Self-pay | Admitting: Adult Health

## 2017-11-02 ENCOUNTER — Ambulatory Visit (INDEPENDENT_AMBULATORY_CARE_PROVIDER_SITE_OTHER): Payer: BLUE CROSS/BLUE SHIELD | Admitting: Gastroenterology

## 2017-11-02 DIAGNOSIS — Z9889 Other specified postprocedural states: Secondary | ICD-10-CM

## 2017-11-02 DIAGNOSIS — D509 Iron deficiency anemia, unspecified: Secondary | ICD-10-CM

## 2017-11-02 LAB — IFOBT (OCCULT BLOOD): IFOBT: NEGATIVE

## 2017-11-03 NOTE — Progress Notes (Signed)
PT is aware.

## 2017-11-12 ENCOUNTER — Encounter: Payer: Self-pay | Admitting: Gastroenterology

## 2017-11-12 ENCOUNTER — Ambulatory Visit: Payer: BLUE CROSS/BLUE SHIELD | Admitting: Gastroenterology

## 2017-11-12 DIAGNOSIS — D509 Iron deficiency anemia, unspecified: Secondary | ICD-10-CM

## 2017-11-12 DIAGNOSIS — D72819 Decreased white blood cell count, unspecified: Secondary | ICD-10-CM

## 2017-11-12 DIAGNOSIS — D649 Anemia, unspecified: Secondary | ICD-10-CM | POA: Insufficient documentation

## 2017-11-12 NOTE — Patient Instructions (Signed)
1. We will touch base with you next week once I discuss your persistent anemia with Dr. Oneida Alar.

## 2017-11-12 NOTE — Assessment & Plan Note (Signed)
57 year old female with history of breast cancer diagnosed in 2017 underwent lumpectomy with radiation, on tamoxifen.  Did not require chemotherapy.  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  Complains of fatigue.  I FOBT negative.  Colonoscopy is up-to-date.  She is on chronic Mobic, cannot exclude chronic occult GI bleeding related to NSAID use.  I will discuss further with Dr. Oneida Alar regarding any additional work-up such as EGD plus or minus capsule study.  Discussed with patient she will likely need to follow-up with her oncologist/hematologist to rule out any concerns on there and regarding otological reason for leukopenia/microcytic anemia/elevated ferritin. Further recommendations to follow.

## 2017-11-12 NOTE — Progress Notes (Addendum)
REVIEWED-NO ADDITIONAL RECOMMENDATIONS.  Primary Care Physician: Monico Blitz, MD  Primary Gastroenterologist:  Barney Drain, MD   Chief Complaint  Patient presents with  . Anemia    f/u. Feels very tired    HPI: Kerri Shah is a 57 y.o. female here for follow-up of microcytic anemia.  She also has leukopenia.  She had a colonoscopy 2018 with one simple adenoma and one hyperplastic polyp.  Next colonoscopy in 5 years due to 2 first-degree relatives with colon cancer, mother and brother.  She has a history of fatty liver seen on prior ultrasound in 2018.  Since we last saw her she has had her gallbladder removed and umbilical hernia repaired back in June.  We rechecked her labs a couple weeks ago to follow-up on microcytic anemia seen back in May.  He had further decline in her hemoglobin at 9.7, hematocrit 30.8, MCV stable at 73.7.  Platelets normal.  Her white count down to 2900 from 3300 several months ago.  Only other labs we have for comparison 12 at which time she also had mildly suppressed white blood cell count at 3700, MCV was low at 72, normal hemoglobin at 12.1.  Other significant finding of ferritin is 426 in April 2018, up to 27 recently.  Recent ifobt negative.  Takes Mobic daily.  She has had significant fatigue the entire summer.  Has not tolerated the heat, with exertion feels like she wants to pass out.  Bowel movements are regular no melena or rectal bleeding.  Overall she is feeling much better after having her gallbladder removed and her hernia repaired.  Occasional vague stomachache.  Appetite good.  History of breast cancer in 2017, status post surgery with radiation.  Did not require chemo.  Currently on tamoxifen.  Sees her oncologist Dr. Lindi Adie in February.      Current Outpatient Medications  Medication Sig Dispense Refill  . docusate sodium (COLACE) 100 MG capsule Take 1 capsule (100 mg total) by mouth 2 (two) times daily. (Patient taking  differently: Take 100 mg by mouth daily. ) 60 capsule 2  . Iron-Vitamins (GERITOL PO) Take by mouth. At nighttime    . lisinopril-hydrochlorothiazide (PRINZIDE,ZESTORETIC) 10-12.5 MG tablet Take 1 tablet by mouth at bedtime.  1  . meloxicam (MOBIC) 15 MG tablet Take 15 mg by mouth at bedtime.    . metFORMIN (GLUCOPHAGE-XR) 500 MG 24 hr tablet Take 500 mg by mouth at bedtime.   1  . omeprazole (PRILOSEC) 20 MG capsule TAKE 1 CAPSULE DAILY. (Patient taking differently: TAKE 1 CAPSULE (20 MG) BY MOUTH ONCE DAILY AT NIGHT.) 30 capsule 5  . oxyCODONE (ROXICODONE) 5 MG immediate release tablet Take 1 tablet (5 mg total) by mouth every 4 (four) hours as needed for severe pain or breakthrough pain. 20 tablet 0  . polyethylene glycol (MIRALAX / GLYCOLAX) packet Take 17 g by mouth daily as needed.    . pravastatin (PRAVACHOL) 40 MG tablet Take 40 mg by mouth at bedtime.   3  . tamoxifen (NOLVADEX) 20 MG tablet Take 1 tablet (20 mg total) by mouth daily. (Patient taking differently: Take 20 mg by mouth at bedtime. ) 90 tablet 3   No current facility-administered medications for this visit.     Allergies as of 11/12/2017  . (No Known Allergies)    ROS:  General: Negative for anorexia, weight loss, fever, chills,  weakness.  Fatigue ENT: Negative for hoarseness, difficulty swallowing , nasal congestion. CV: Negative for  chest pain, angina, palpitations, dyspnea on exertion, peripheral edema.  Respiratory: Negative for dyspnea at rest, dyspnea on exertion, cough, sputum, wheezing.  GI: See history of present illness. GU:  Negative for dysuria, hematuria, urinary incontinence, urinary frequency, nocturnal urination.  Endo: Negative for unusual weight change.    Physical Examination:   BP 128/78   Pulse 97   Temp (!) 97.2 F (36.2 C) (Oral)   Ht 5' 6.5" (1.689 m)   Wt 166 lb 3.2 oz (75.4 kg)   BMI 26.42 kg/m   General: Well-nourished, well-developed in no acute distress.  Eyes: No  icterus. Mouth: Oropharyngeal mucosa moist and pink , no lesions erythema or exudate. Lungs: Clear to auscultation bilaterally.  Heart: Regular rate and rhythm, no murmurs rubs or gallops.  Abdomen: Bowel sounds are normal, nontender, nondistended, no hepatosplenomegaly or masses, no abdominal bruits or hernia , no rebound or guarding.   Extremities: No lower extremity edema. No clubbing or deformities. Neuro: Alert and oriented x 4   Skin: Warm and dry, no jaundice.   Psych: Alert and cooperative, normal mood and affect.  Labs:  Lab Results  Component Value Date   WBC 2.9 (L) 10/18/2017   HGB 9.7 (L) 10/18/2017   HCT 30.8 (L) 10/18/2017   MCV 73.7 (L) 10/18/2017   PLT 261 10/18/2017   Lab Results  Component Value Date   IRON 83 10/18/2017   TIBC 363 10/18/2017   FERRITIN 532 (H) 10/18/2017   Lab Results  Component Value Date   ALT 25 06/25/2017   AST 24 06/25/2017   ALKPHOS 34 (L) 06/25/2017   BILITOT 0.6 06/25/2017    Imaging Studies: No results found.

## 2017-11-15 NOTE — Progress Notes (Signed)
CC'D TO PCP °

## 2017-12-08 ENCOUNTER — Ambulatory Visit
Admission: RE | Admit: 2017-12-08 | Discharge: 2017-12-08 | Disposition: A | Discharge status: Home / Self Care | Payer: BLUE CROSS/BLUE SHIELD | Source: Ambulatory Visit | Attending: Adult Health | Admitting: Adult Health

## 2017-12-08 DIAGNOSIS — Z9889 Other specified postprocedural states: Secondary | ICD-10-CM

## 2017-12-09 ENCOUNTER — Other Ambulatory Visit: Payer: Self-pay

## 2017-12-09 ENCOUNTER — Telehealth: Payer: Self-pay | Admitting: Gastroenterology

## 2017-12-09 DIAGNOSIS — D649 Anemia, unspecified: Secondary | ICD-10-CM

## 2017-12-09 NOTE — Telephone Encounter (Signed)
Pt is aware and OK to schedule.  

## 2017-12-09 NOTE — Telephone Encounter (Signed)
Kerri Shah, please tell the patient I am so sorry for the delay.  Dr. Oneida Alar recommends EGD with possible small bowel capsule endoscopy.  Diagnosis anemia Please schedule Hold iron 7 days

## 2017-12-09 NOTE — Telephone Encounter (Signed)
Called pt, EGD/-/+[facility] w/SLF scheduled for 02/07/18 at 8:30am. Informed her to hold Iron for 7 days prior to procedure. Orders entered. Instructions mailed.

## 2017-12-14 NOTE — Telephone Encounter (Signed)
Called [employer], no PA needed for [employer]. Ref# [id number].

## 2017-12-22 ENCOUNTER — Telehealth: Payer: Self-pay

## 2017-12-22 NOTE — Telephone Encounter (Signed)
Pt calling to report that she recently had her blood work done with her regular dr and was found to have a drop in her Hg count. Iron levels were checked and were fine with elevated ferritin . Pt PCP is suggesting that she make an appt with Dr.Gudena to discuss possible causes of her hg dropping. Pt is scheduled for endoscopy end of December, but pt is very weak and has moderate to severe fatigue. She would like to come in to see Dr.Gudena and discuss her symptoms and see why she is feeling this way, despite her taking iron supplements.   Sent message to scheduling within the next 1-2 weeks. Advised that pt call by next week if she does not hear from scheduling. Pt verbalized understanding.

## 2017-12-24 ENCOUNTER — Telehealth: Payer: Self-pay | Admitting: Hematology and Oncology

## 2017-12-24 NOTE — Telephone Encounter (Signed)
Called regarding 11/20

## 2017-12-29 ENCOUNTER — Inpatient Hospital Stay: Payer: BLUE CROSS/BLUE SHIELD | Attending: Hematology and Oncology | Admitting: Hematology and Oncology

## 2017-12-29 ENCOUNTER — Inpatient Hospital Stay: Payer: BLUE CROSS/BLUE SHIELD

## 2017-12-29 VITALS — BP 115/75 | HR 111 | Temp 98.3°F | Resp 18 | Ht 66.5 in | Wt 164.8 lb

## 2017-12-29 DIAGNOSIS — Z79810 Long term (current) use of selective estrogen receptor modulators (SERMs): Secondary | ICD-10-CM | POA: Diagnosis not present

## 2017-12-29 DIAGNOSIS — M791 Myalgia, unspecified site: Secondary | ICD-10-CM | POA: Diagnosis not present

## 2017-12-29 DIAGNOSIS — R5383 Other fatigue: Secondary | ICD-10-CM | POA: Diagnosis not present

## 2017-12-29 DIAGNOSIS — Z791 Long term (current) use of non-steroidal anti-inflammatories (NSAID): Secondary | ICD-10-CM | POA: Insufficient documentation

## 2017-12-29 DIAGNOSIS — D509 Iron deficiency anemia, unspecified: Secondary | ICD-10-CM | POA: Insufficient documentation

## 2017-12-29 DIAGNOSIS — Z79899 Other long term (current) drug therapy: Secondary | ICD-10-CM | POA: Insufficient documentation

## 2017-12-29 DIAGNOSIS — D0512 Intraductal carcinoma in situ of left breast: Secondary | ICD-10-CM

## 2017-12-29 DIAGNOSIS — Z170 Estrogen receptor positive status [ER+]: Secondary | ICD-10-CM | POA: Insufficient documentation

## 2017-12-29 DIAGNOSIS — Z923 Personal history of irradiation: Secondary | ICD-10-CM | POA: Diagnosis not present

## 2017-12-29 DIAGNOSIS — Z7984 Long term (current) use of oral hypoglycemic drugs: Secondary | ICD-10-CM | POA: Diagnosis not present

## 2017-12-29 DIAGNOSIS — R232 Flushing: Secondary | ICD-10-CM | POA: Insufficient documentation

## 2017-12-29 LAB — CBC WITH DIFFERENTIAL (CANCER CENTER ONLY)
ABS IMMATURE GRANULOCYTES: 0.01 10*3/uL (ref 0.00–0.07)
Basophils Absolute: 0 10*3/uL (ref 0.0–0.1)
Basophils Relative: 1 %
Eosinophils Absolute: 0 10*3/uL (ref 0.0–0.5)
Eosinophils Relative: 1 %
HEMATOCRIT: 33 % — AB (ref 36.0–46.0)
HEMOGLOBIN: 10.4 g/dL — AB (ref 12.0–15.0)
IMMATURE GRANULOCYTES: 0 %
LYMPHS ABS: 1.1 10*3/uL (ref 0.7–4.0)
LYMPHS PCT: 29 %
MCH: 23.3 pg — AB (ref 26.0–34.0)
MCHC: 31.5 g/dL (ref 30.0–36.0)
MCV: 73.8 fL — ABNORMAL LOW (ref 80.0–100.0)
MONO ABS: 0.3 10*3/uL (ref 0.1–1.0)
MONOS PCT: 9 %
NEUTROS ABS: 2.2 10*3/uL (ref 1.7–7.7)
NEUTROS PCT: 60 %
Platelet Count: 245 10*3/uL (ref 150–400)
RBC: 4.47 MIL/uL (ref 3.87–5.11)
RDW: 14.2 % (ref 11.5–15.5)
WBC Count: 3.7 10*3/uL — ABNORMAL LOW (ref 4.0–10.5)
nRBC: 0 % (ref 0.0–0.2)

## 2017-12-29 LAB — CMP (CANCER CENTER ONLY)
ALT: 15 U/L (ref 0–44)
ANION GAP: 9 (ref 5–15)
AST: 17 U/L (ref 15–41)
Albumin: 4.3 g/dL (ref 3.5–5.0)
Alkaline Phosphatase: 38 U/L (ref 38–126)
BUN: 16 mg/dL (ref 6–20)
CO2: 28 mmol/L (ref 22–32)
Calcium: 9.8 mg/dL (ref 8.9–10.3)
Chloride: 105 mmol/L (ref 98–111)
Creatinine: 1.43 mg/dL — ABNORMAL HIGH (ref 0.44–1.00)
GFR, Est AFR Am: 45 mL/min — ABNORMAL LOW (ref >60)
GFR, Estimated: 39 mL/min — ABNORMAL LOW (ref >60)
GLUCOSE: 118 mg/dL — AB (ref 70–99)
POTASSIUM: 3.7 mmol/L (ref 3.5–5.1)
Sodium: 142 mmol/L (ref 135–145)
Total Bilirubin: 0.3 mg/dL (ref 0.3–1.2)
Total Protein: 7.3 g/dL (ref 6.5–8.1)

## 2017-12-29 LAB — VITAMIN B12: VITAMIN B 12: 789 pg/mL (ref 180–914)

## 2017-12-29 LAB — FOLATE: Folate: 38.4 ng/mL (ref >5.9)

## 2017-12-29 NOTE — Progress Notes (Signed)
Patient Care Team: Monico Blitz, [name], MD (Gastroenterology) Delice Bison Charlestine Massed, NP as Nurse Practitioner (Hematology and Oncology) Nicholas Lose, MD as Consulting Physician (Hematology and Oncology) Eppie Gibson, MD as Attending Physician (Radiation Oncology) [name], MD as Consulting Physician (General Surgery)  DIAGNOSIS:  Encounter Diagnoses  Name Primary?  . Ductal carcinoma in situ (DCIS) of left breast   . Microcytic anemia Yes    SUMMARY OF ONCOLOGIC HISTORY:   Ductal carcinoma in situ (DCIS) of left breast   12/31/2015 Initial Diagnosis    Left breast [name] biopsy: DCIS with calcifications ER 90%, PR -50%, high-grade    01/30/2016 Surgery    Left Lumpectomy: No residual DCIS, fibrocystic changes with UDH    02/27/2016 - 04/16/2016 Radiation Therapy    Adjuvant radiation therapy in [facility]    04/28/2016 -  Anti-estrogen oral therapy    Tamoxifen 20 mg daily 5 years     CHIEF COMPLIANT: Follow-up regarding worsening anemia  INTERVAL HISTORY: Kerri Shah is a 57 year old with above-mentioned history of DCIS was currently on tamoxifen tolerating it fairly well.  She is here today mainly for work-up of anemia.  She has not been feeling well and severe fatigue and generalized weakness and hemoglobin was in the nines.  She was microcytic and iron studies did not reveal port tomorrow when she comes here is working fine I am sure it is working fine in that case he can remove the and if she wants me if she is accessed and the tape is tomorrow he can use it twice tomorrow she should have access to the port and leave the PICU to be pulled out okay  REVIEW OF SYSTEMS:   Constitutional: Denies fevers, chills or abnormal weight loss Eyes: Denies blurriness of vision Ears, nose, mouth, throat, and face: Denies mucositis or sore throat Respiratory: Denies cough, dyspnea or wheezes Cardiovascular: Denies palpitation,  chest discomfort Gastrointestinal:  Denies nausea, heartburn or change in bowel habits Skin: Denies abnormal skin rashes Lymphatics: Denies new lymphadenopathy or easy bruising Neurological:Denies numbness, tingling or new weaknesses Behavioral/Psych: Mood is stable, no new changes  Extremities: No lower extremity edema Breast:  denies any pain or lumps or nodules in either breasts All other systems were reviewed with the patient and are negative.  I have reviewed the past medical history, past surgical history, social history and family history with the patient and they are unchanged from previous note.  ALLERGIES:  has No Known Allergies.  MEDICATIONS:  Current Outpatient Medications  Medication Sig Dispense Refill  . docusate sodium (COLACE) 100 MG capsule Take 1 capsule (100 mg total) by mouth 2 (two) times daily. (Patient taking differently: Take 100 mg by mouth daily. ) 60 capsule 2  . Iron-Vitamins (GERITOL PO) Take by mouth. At nighttime    . lisinopril-hydrochlorothiazide (PRINZIDE,ZESTORETIC) 10-12.5 MG tablet Take 1 tablet by mouth at bedtime.  1  . meloxicam (MOBIC) 15 MG tablet Take 15 mg by mouth at bedtime.    . metFORMIN (GLUCOPHAGE-XR) 500 MG 24 hr tablet Take 500 mg by mouth at bedtime.   1  . omeprazole (PRILOSEC) 20 MG capsule TAKE 1 CAPSULE DAILY. (Patient taking differently: TAKE 1 CAPSULE (20 MG) BY MOUTH ONCE DAILY AT NIGHT.) 30 capsule 5  . oxyCODONE (ROXICODONE) 5 MG immediate release tablet Take 1 tablet (5 mg total) by mouth every 4 (four) hours as needed for severe pain or breakthrough pain. 20 tablet  0  . polyethylene glycol (MIRALAX / GLYCOLAX) packet Take 17 g by mouth daily as needed.    . pravastatin (PRAVACHOL) 40 MG tablet Take 40 mg by mouth at bedtime.   3  . tamoxifen (NOLVADEX) 20 MG tablet Take 1 tablet (20 mg total) by mouth daily. (Patient taking differently: Take 20 mg by mouth at bedtime. ) 90 tablet 3   No current facility-administered  medications for this visit.     PHYSICAL EXAMINATION: ECOG PERFORMANCE STATUS: 2 - Symptomatic, <50% confined to bed  Vitals:   12/29/17 1509  BP: 115/75  Pulse: (!) 111  Resp: 18  Temp: 98.3 F (36.8 C)  SpO2: 100%   Filed Weights   12/29/17 1509  Weight: 164 lb 12.8 oz (74.8 kg)    GENERAL:alert, no distress and comfortable SKIN: skin color, texture, turgor are normal, no rashes or significant lesions EYES: normal, Conjunctiva are pink and non-injected, sclera clear OROPHARYNX:no exudate, no erythema and lips, buccal mucosa, and tongue normal  NECK: supple, thyroid normal size, non-tender, without nodularity LYMPH:  no palpable lymphadenopathy in the cervical, axillary or inguinal LUNGS: clear to auscultation and percussion with normal breathing effort HEART: regular rate & rhythm and no murmurs and no lower extremity edema ABDOMEN:abdomen soft, non-tender and normal bowel sounds MUSCULOSKELETAL:no cyanosis of digits and no clubbing  NEURO: alert & oriented x 3 with fluent speech, no focal motor/sensory deficits EXTREMITIES: No lower extremity edema   LABORATORY DATA:  I have reviewed the data as listed CMP Latest Ref Rng & Units 12/29/2017 06/25/2017 11/04/2016  Glucose 70 - 99 mg/dL 118(H) 128(H) -  BUN 6 - 20 mg/dL 16 20 -  Creatinine 0.44 - 1.00 mg/dL 1.43(H) 0.84 -  Sodium 135 - 145 mmol/L 142 137 -  Potassium 3.5 - 5.1 mmol/L 3.7 3.8 -  Chloride 98 - 111 mmol/L 105 102 -  CO2 22 - 32 mmol/L 28 27 -  Calcium 8.9 - 10.3 mg/dL 9.8 9.2 -  Total Protein 6.5 - 8.1 g/dL 7.3 7.3 6.7  Total Bilirubin 0.3 - 1.2 mg/dL 0.3 0.6 0.4  Alkaline Phos 38 - 126 U/L 38 34(L) -  AST 15 - 41 U/L _0 ALT 0 - 44 U/L _1 Lab Results  Component Value Date   WBC 3.7 (L) 12/29/2017   HGB 10.4 (L) 12/29/2017   HCT 33.0 (L) 12/29/2017   MCV 73.8 (L) 12/29/2017   PLT 245 12/29/2017   NEUTROABS 2.2 12/29/2017    ASSESSMENT & PLAN:  Ductal carcinoma in situ (DCIS) of  left breast Left breast [name] biopsy 12/31/2015: 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 Breast conserving surgery: 01/30/16: ADH 2. Followed by adjuvant radiation therapy 3. Followed by antiestrogen therapy with tamoxifen 5 yearsstarted 04/28/2016  Tamoxifen toxicities: 1.Occasional hot flashes 2.Myalgias  Breast cancer surveillance: 1.  Breast exam 12/29/2017: Benign 2. mammogram 12/08/2017: Benign breast density category C  Return to clinic in 1 year  Microcytic anemia Chronic microcytic anemia her hemoglobin normally runs around 11 g. More recent hemoglobin has been running around 9 g.  Because this she has developed symptoms of anemia. Previous work-up showed normal iron studies suggestive that the microcytosis may not be related to iron deficiency.  Plan: 1.  Recheck CBC with differential 2. reticulocyte count 3.  Repeat iron studies F02 and folic acid 4.  Hemoglobin electrophoresis to look for thalassemia  I will call her  with results of this test. If in 3 months her symptoms do not improve and anemia continues to worsen then we may have to do a bone marrow biopsy.  On her blood work from today to appears that her hemoglobin is 10.4 and creatinine is 1.43.  She has no previous kidney disease.  So her anemia could be multifactorial.  I will wait for the rest of the blood work before I call her with these results.   Orders Placed This Encounter  Procedures  . Erythropoietin    Standing Status:   Future    Number of Occurrences:   1    Standing Expiration Date:   12/29/2018  . Ferritin    Standing Status:   Future    Number of Occurrences:   1    Standing Expiration Date:   12/29/2018  . Iron and TIBC    Standing Status:   Future    Number of Occurrences:   1    Standing Expiration Date:   12/29/2018  . Hemoglobinopathy evaluation    Standing Status:   Future    Number of Occurrences:   1    Standing Expiration Date:    12/29/2018  . CBC with Differential (Cancer Center Only)    Standing Status:   Future    Number of Occurrences:   1    Standing Expiration Date:   12/30/2018  . CMP ([place] only)    Standing Status:   Future    Number of Occurrences:   1    Standing Expiration Date:   12/30/2018  . Vitamin B12    Standing Status:   Future    Number of Occurrences:   1    Standing Expiration Date:   12/29/2018  . Folate, Serum    Standing Status:   Future    Number of Occurrences:   1    Standing Expiration Date:   12/29/2018   The patient has a good understanding of the overall plan. she agrees with it. she will call with any problems that may develop before the next visit here.   Harriette Ohara, MD 12/29/17

## 2017-12-29 NOTE — Assessment & Plan Note (Signed)
Chronic microcytic anemia her hemoglobin normally runs around 11 g. More recent hemoglobin has been running around 9 g.  Because this she has developed symptoms of anemia. Previous work-up showed normal iron studies suggestive that the microcytosis may not be related to iron deficiency.  Plan: 1.  Recheck CBC with differential 2. reticulocyte count 3.  Repeat iron studies Z35 and folic acid 4.  Hemoglobin electrophoresis to look for thalassemia  I will call her with results of this test. If in 3 months her symptoms do not improve and anemia continues to worsen then we may have to do a bone marrow biopsy.

## 2017-12-29 NOTE — Assessment & Plan Note (Signed)
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 Breast conserving surgery: 01/30/16: ADH 2. Followed by adjuvant radiation therapy 3. Followed by antiestrogen therapy with tamoxifen 5 yearsstarted 04/28/2016  Tamoxifen toxicities: 1.Occasional hot flashes 2.Myalgias  Breast cancer surveillance: 1.  Breast exam 12/29/2017: Benign 2. mammogram 12/08/2017: Benign breast density category C  Return to clinic in 1 year

## 2017-12-30 ENCOUNTER — Telehealth: Payer: Self-pay | Admitting: Hematology and Oncology

## 2017-12-30 LAB — IRON AND TIBC
Iron: 51 ug/dL (ref 41–142)
SATURATION RATIOS: 13 % — AB (ref 21–57)
TIBC: 396 ug/dL (ref 236–444)
UIBC: 345 ug/dL (ref 120–384)

## 2017-12-30 LAB — HEMOGLOBINOPATHY EVALUATION
HGB F QUANT: 0 % (ref 0.0–2.0)
HGB S QUANTITAION: 0 %
Hgb A2 Quant: 1.8 % (ref 1.8–3.2)
Hgb A: 98.2 % (ref 96.4–98.8)
Hgb C: 0 %
Hgb Variant: 0 %

## 2017-12-30 LAB — ERYTHROPOIETIN: ERYTHROPOIETIN: 12.3 m[IU]/mL (ref 2.6–18.5)

## 2017-12-30 LAB — FERRITIN: Ferritin: 407 ng/mL — ABNORMAL HIGH (ref 11–307)

## 2017-12-30 NOTE — Telephone Encounter (Signed)
Called patient to let them know that a lab was added to their Feb appt.  Left message.

## 2017-12-30 NOTE — Telephone Encounter (Signed)
I called and left a message that the hemoglobin has improved slightly to 10.4 g and that the iron studies and B12 were normal. She did have elevation of creatinine suggestive of kidney issues.  This could be potentially 1 of the causes of her anemia. We are still waiting on hemoglobin electrophoresis to rule out thalassemia.

## 2018-02-07 ENCOUNTER — Encounter (HOSPITAL_COMMUNITY): Payer: Self-pay | Admitting: Gastroenterology

## 2018-02-07 ENCOUNTER — Ambulatory Visit (HOSPITAL_COMMUNITY)
Admission: RE | Admit: 2018-02-07 | Discharge: 2018-02-07 | Disposition: A | Discharge status: Home / Self Care | Payer: BLUE CROSS/BLUE SHIELD | Attending: Gastroenterology | Admitting: Gastroenterology

## 2018-02-07 ENCOUNTER — Other Ambulatory Visit: Payer: Self-pay

## 2018-02-07 ENCOUNTER — Encounter (HOSPITAL_COMMUNITY)
Admission: RE | Disposition: A | Discharge status: Home / Self Care | Payer: Self-pay | Source: Home / Self Care | Attending: Gastroenterology

## 2018-02-07 DIAGNOSIS — D509 Iron deficiency anemia, unspecified: Secondary | ICD-10-CM | POA: Insufficient documentation

## 2018-02-07 DIAGNOSIS — K311 Adult hypertrophic pyloric stenosis: Secondary | ICD-10-CM | POA: Insufficient documentation

## 2018-02-07 DIAGNOSIS — K219 Gastro-esophageal reflux disease without esophagitis: Secondary | ICD-10-CM | POA: Insufficient documentation

## 2018-02-07 DIAGNOSIS — I10 Essential (primary) hypertension: Secondary | ICD-10-CM | POA: Diagnosis not present

## 2018-02-07 DIAGNOSIS — Z853 Personal history of malignant neoplasm of breast: Secondary | ICD-10-CM | POA: Diagnosis not present

## 2018-02-07 DIAGNOSIS — Z7984 Long term (current) use of oral hypoglycemic drugs: Secondary | ICD-10-CM | POA: Insufficient documentation

## 2018-02-07 DIAGNOSIS — Z79899 Other long term (current) drug therapy: Secondary | ICD-10-CM | POA: Insufficient documentation

## 2018-02-07 DIAGNOSIS — Z79810 Long term (current) use of selective estrogen receptor modulators (SERMs): Secondary | ICD-10-CM | POA: Diagnosis not present

## 2018-02-07 DIAGNOSIS — D649 Anemia, unspecified: Secondary | ICD-10-CM

## 2018-02-07 DIAGNOSIS — E7800 Pure hypercholesterolemia, unspecified: Secondary | ICD-10-CM | POA: Diagnosis not present

## 2018-02-07 DIAGNOSIS — E119 Type 2 diabetes mellitus without complications: Secondary | ICD-10-CM | POA: Insufficient documentation

## 2018-02-07 DIAGNOSIS — K2970 Gastritis, unspecified, without bleeding: Secondary | ICD-10-CM

## 2018-02-07 HISTORY — PX: ESOPHAGOGASTRODUODENOSCOPY: SHX5428

## 2018-02-07 HISTORY — PX: GIVENS CAPSULE STUDY: SHX5432

## 2018-02-07 LAB — GLUCOSE, CAPILLARY
Glucose-Capillary: 91 mg/dL (ref 70–99)
Glucose-Capillary: 167 mg/dL — ABNORMAL HIGH (ref 70–99)

## 2018-02-07 SURGERY — EGD (ESOPHAGOGASTRODUODENOSCOPY)
Anesthesia: Moderate Sedation

## 2018-02-07 MED ORDER — LIDOCAINE VISCOUS HCL 2 % MT SOLN
OROMUCOSAL | Status: DC | PRN
  Administered 2018-02-07: 1 via OROMUCOSAL

## 2018-02-07 MED ORDER — MIDAZOLAM HCL 5 MG/5ML IJ SOLN
INTRAMUSCULAR | Status: AC
  Filled 2018-02-07: qty 10

## 2018-02-07 MED ORDER — DEXTROSE 5 % IV SOLN
INTRAVENOUS | Status: AC | PRN
  Administered 2018-02-07: 200 mL via INTRAVENOUS

## 2018-02-07 MED ORDER — MEPERIDINE HCL 100 MG/ML IJ SOLN
INTRAMUSCULAR | Status: DC | PRN
  Administered 2018-02-07: 50 mg

## 2018-02-07 MED ORDER — ONDANSETRON HCL 4 MG/2ML IJ SOLN
INTRAMUSCULAR | Status: AC
  Filled 2018-02-07: qty 2

## 2018-02-07 MED ORDER — LIDOCAINE VISCOUS HCL 2 % MT SOLN
OROMUCOSAL | Status: AC
  Filled 2018-02-07: qty 15

## 2018-02-07 MED ORDER — MIDAZOLAM HCL 5 MG/5ML IJ SOLN
INTRAMUSCULAR | Status: DC | PRN
  Administered 2018-02-07: 2 mg via INTRAVENOUS
  Administered 2018-02-07: 1 mg via INTRAVENOUS

## 2018-02-07 MED ORDER — MEPERIDINE HCL 100 MG/ML IJ SOLN
INTRAMUSCULAR | Status: AC
  Filled 2018-02-07: qty 2

## 2018-02-07 MED ORDER — SODIUM CHLORIDE 0.9 % IV SOLN: INTRAVENOUS | Status: DC

## 2018-02-07 NOTE — Discharge Instructions (Signed)
I dilated your PYLORUS IN ORDER TO PASS THE GIVENS CAPSULE. YOU HAVE mild gastritis. I PLACED THE GIVENS IN THE SMALL BOWEL.    RETURN GIVENS CAPSULE THIS AFTERNOON.  FOLLOW UP IN 4 MOS.  UPPER ENDOSCOPY AFTER CARE Read the instructions outlined below and refer to this sheet in the next week. These discharge instructions provide you with general information on caring for yourself after you leave the hospital. While your treatment has been planned according to the most current medical practices available, unavoidable complications occasionally occur. If you have any problems or questions after discharge, call DR. Nicloe Frontera, [phone].  ACTIVITY  You may resume your regular activity, but move at a slower pace for the next 24 hours.   Take frequent rest periods for the next 24 hours.   Walking will help get rid of the air and reduce the bloated feeling in your belly (abdomen).   No driving for 24 hours (because of the medicine (anesthesia) used during the test).   You may shower.   Do not sign any important legal documents or operate any machinery for 24 hours (because of the anesthesia used during the test).    NUTRITION  Drink plenty of fluids.   You may resume your normal diet as instructed by your doctor.   Begin with a light meal and progress to your normal diet. Heavy or fried foods are harder to digest and may make you feel sick to your stomach (nauseated).   Avoid alcoholic beverages for 24 hours or as instructed.    MEDICATIONS  You may resume your normal medications.   WHAT YOU CAN EXPECT TODAY  Some feelings of bloating in the abdomen.   Passage of more gas than usual.    IF YOU HAD A BIOPSY TAKEN DURING THE UPPER ENDOSCOPY:  Eat a soft diet IF YOU HAVE NAUSEA, BLOATING, ABDOMINAL PAIN, OR VOMITING.    FINDING OUT THE RESULTS OF YOUR TEST Not all test results are available during your visit. DR. Oneida Alar WILL CALL YOU WITHIN 14 DAYS OF YOUR PROCEDUE WITH  YOUR RESULTS. Do not assume everything is normal if you have not heard from DR. Hernando Reali, CALL HER OFFICE AT [phone].  SEEK IMMEDIATE MEDICAL ATTENTION AND CALL THE OFFICE: [phone] IF:  You have more than a spotting of blood in your stool.   Your belly is swollen (abdominal distention).   You are nauseated or vomiting.   You have a temperature over 101F.   You have abdominal pain or discomfort that is severe or gets worse throughout the day.  Gastritis  Gastritis is an inflammation (the body's way of reacting to injury and/or infection) of the stomach. It is often caused by viral or bacterial (germ) infections. It can also be caused BY ASPIRIN, BC/GOODY POWDER'S, (IBUPROFEN) MOTRIN, OR ALEVE (NAPROXEN), chemicals (including alcohol), SPICY FOODS, and medications. This illness may be associated with generalized malaise (feeling tired, not well), UPPER ABDOMINAL STOMACH cramps, and fever. One common bacterial cause of gastritis is an organism known as H. Pylori. This can be treated with antibiotics.

## 2018-02-07 NOTE — H&P (Addendum)
Primary Care Physician:  Monico Blitz, MD Primary Gastroenterologist:  Dr. Oneida Alar  Pre-Procedure History & Physical: HPI:  Kerri Shah is a 57 y.o. female here for MICROCYTIC anemia.  PT DENIES FEVER, CHILLS, HEMATOCHEZIA, HEMATEMESIS, nausea, vomiting, melena, diarrhea, CHEST PAIN, SHORTNESS OF BREATH,  CHANGE IN BOWEL IN HABITS, constipation, abdominal pain, problems swallowing, problems with sedation, OR heartburn or indigestion.   Past Medical History:  Diagnosis Date  . Breast cancer ([place]) 2017   left breast lumpectomy  . Breast discharge    lt bloody discharge x's 3 weeks   . Breast mass    lt breast mass x's 3 weeks   . Complication of anesthesia   . DM (diabetes mellitus) ([place])    not on any medications  . Fatty liver   . GERD (gastroesophageal reflux disease)   . History of kidney stones   . Hypercholesteremia   . Hypertension   . Osteoarthritis    bilateral knees and Left shoulder  . Personal history of radiation therapy 04/2016   radiation   . PONV (postoperative nausea and vomiting)   . Vitamin D deficiency     Past Surgical History:  Procedure Laterality Date  . ABDOMINAL HYSTERECTOMY    . BREAST LUMPECTOMY Left 2018  . BREAST LUMPECTOMY WITH RADIOACTIVE SEED LOCALIZATION Left 01/30/2016   Procedure: BREAST LUMPECTOMY WITH RADIOACTIVE SEED LOCALIZATION;  Surgeon: [name], MD;  Location: [facility];  Service: General;  Laterality: Left;  . CHOLECYSTECTOMY N/A 07/30/2017   Procedure: LAPAROSCOPIC CHOLECYSTECTOMY;  Surgeon: Virl Cagey, MD;  Location: [facility];  Service: General;  Laterality: N/A;  . COLONOSCOPY  12/23/10   internal hemorrhoids/polyps in the recto-sigmoid colon, hyperplastic, TCS IN 5 years  . COLONOSCOPY N/A 06/29/2016   Dr. Oneida Alar: Nonthrombosed external hemorrhoids, 2 2 to 3 mm polyps removed, mild diverticulosis, internal hemorrhoids.  Path revealed tubular adenoma.  Next colonoscopy 5 years based on family  history as well.  Marland Kitchen PARTIAL HYSTERECTOMY  2010   uterine fibroids  . TUBAL LIGATION    . UPPER GASTROINTESTINAL ENDOSCOPY  Nov 2012   mild gastritis, patent esophageal ring s/p forceps dilation, chronic gastritis on path    Prior to Admission medications   Medication Sig Start Date End Date Taking? Authorizing Provider  lisinopril-hydrochlorothiazide (PRINZIDE,ZESTORETIC) 10-12.5 MG tablet Take 1 tablet by mouth at bedtime. 07/06/17  Yes [provider]  metFORMIN (GLUCOPHAGE-XR) 500 MG 24 hr tablet Take 500 mg by mouth at bedtime.  01/16/16  Yes [provider]  Multiple Vitamins-Minerals (MULTIVITAMIN WITH MINERALS) tablet Take 1 tablet by mouth daily.   Yes [provider]  omeprazole (PRILOSEC) 20 MG capsule TAKE 1 CAPSULE DAILY. Patient taking differently: TAKE 1 CAPSULE (20 MG) BY MOUTH ONCE DAILY AT NIGHT. 08/29/12  Yes Annitta Needs, NP  pravastatin (PRAVACHOL) 40 MG tablet Take 40 mg by mouth at bedtime.  01/13/16  Yes [provider]  tamoxifen (NOLVADEX) 20 MG tablet Take 1 tablet (20 mg total) by mouth daily. Patient taking differently: Take 20 mg by mouth at bedtime.  03/22/17  Yes Nicholas Lose, MD    Allergies as of 12/09/2017  . (No Known Allergies)    Family History  Problem Relation Age of Onset  . Colon cancer Mother 75  . Colon cancer Brother 68  . Diabetes Brother     Social History   Socioeconomic History  . Marital status: Married    Spouse name: Not on file  .  Number of children: 2  . Years of education: Not on file  . Highest education level: Not on file  Occupational History  . Occupation: Nsg Aide  Social Needs  . Financial resource strain: Not on file  . Food insecurity:    Worry: Not on file    Inability: Not on file  . Transportation needs:    Medical: Not on file    Non-medical: Not on file  Tobacco Use  . Smoking status: Never Smoker  . Smokeless tobacco: Never Used  Substance and Sexual Activity  .  Alcohol use: No  . Drug use: No  . Sexual activity: Yes    Birth control/protection: Surgical  Lifestyle  . Physical activity:    Days per week: Not on file    Minutes per session: Not on file  . Stress: Not on file  Relationships  . Social connections:    Talks on phone: Not on file    Gets together: Not on file    Attends religious service: Not on file    Active member of club or organization: Not on file    Attends meetings of clubs or organizations: Not on file    Relationship status: Not on file  . Intimate partner violence:    Fear of current or ex partner: Not on file    Emotionally abused: Not on file    Physically abused: Not on file    Forced sexual activity: Not on file  Other Topics Concern  . Not on file  Social History Narrative  . Not on file    Review of Systems: See HPI, otherwise negative ROS   Physical Exam: BP 129/71   Pulse 88   Resp 11   Ht 5' 6.5" (1.689 m)   Wt 73.5 kg   SpO2 100%   BMI 25.76 kg/m  General:   Alert,  pleasant and cooperative in NAD Head:  Normocephalic and atraumatic. Neck:  Supple; Lungs:  Clear throughout to auscultation.    Heart:  Regular rate and rhythm. Abdomen:  Soft, nontender and nondistended. Normal bowel sounds, without guarding, and without rebound.   Neurologic:  Alert and  oriented x4;  grossly normal neurologically.  Impression/Plan:     Anemia/BRBPR  PLAN:  1. EGD TODAY. DISCUSSED PROCEDURE, BENEFITS, & RISKS: < 1% chance of medication reaction, givens capsule retention requiring surgery, bleeding, OR perforation.

## 2018-02-07 NOTE — Op Note (Signed)
[facility] Patient Name: Kerri Shah Procedure Date: 02/07/2018 7:17 AM MRN: [id number] Date of Birth: October 18, 1960 Attending MD: Barney Drain MD, MD CSN: [id number] Age: 57 Admit Type: Outpatient Procedure:                Upper GI endoscopy WITH BALLOON DILATION Indications:              Anemia Providers:                Barney Drain MD, MD, Gerome Sam, RN, Aram Candela Referring MD:             Fuller Canada [name] MD, MD, Nicholas Lose, MD Medicines:                Ondansetron 4 mg IV, Meperidine 50 mg IV, Midazolam                            3 mg IV Complications:            No immediate complications. Estimated Blood Loss:     Estimated blood loss was minimal. Procedure:                After obtaining informed consent, the endoscope was                            passed under direct vision. Throughout the                            procedure, the patient's blood pressure, pulse, and                            oxygen saturations were monitored continuously. The                            [id number]-H190 ([id number]) scope was introduced through the                            mouth, and advanced to the second part of duodenum.                            The upper GI endoscopy was accomplished without                            difficulty. The patient tolerated the procedure                            well. Scope In: 8:51:34 AM Scope Out: 9:02:38 AM Total Procedure Duration: 0 hours 11 minutes 4 seconds  Findings:      The examined esophagus was normal.      Patchy mild inflammation characterized by congestion (edema) and       erythema was found in the gastric body and in the gastric antrum.      A benign-appearing, intrinsic mild stenosis was found at the pylorus.       This was traversed WITH MODERATE RESISTANCE. A  TTS dilator was passed       through the scope. Dilation with a 13.5 mm and a 15 mm pyloric balloon       dilator was performed SO GIVENS  CAPSULE COULD BE PLACED IN SMALL BOWEL.       MILD TRAUMA TO DUODENAL MUCOSA AFTER DILATION. GIVENS CAPSULE RELEASED       IN SMALL BOWEL.      The examined duodenum was normal. Impression:               - NO SOURCE FOR MICROCYTIC ANEMIA IDENTIFIED                           - MILD Gastritis.                           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                            Dilated. Moderate Sedation:      Moderate (conscious) sedation was administered by the endoscopy nurse       and supervised by the endoscopist. The following parameters were       monitored: oxygen saturation, heart rate, blood pressure, and response       to care. Total physician intraservice time was 19 minutes. Recommendation:           - Patient has a contact number available for                            emergencies. The signs and symptoms of potential                            delayed complications were discussed with the                            patient. Return to normal activities tomorrow.                            Written discharge instructions were provided to the                            patient.                           - High fiber diet. AWAIT CAPSULE READ.                           - Continue present medications.                           - Return to my office in 4 months. Procedure Code(s):        --- Professional ---                           [phone], Esophagogastroduodenoscopy, flexible,                            transoral; with  dilation of gastric/duodenal                            stricture(s) (eg, balloon, bougie)                           G0500, Moderate sedation services provided by the                            same physician or other qualified health care                            professional performing a gastrointestinal                            endoscopic service that sedation supports,                            requiring the presence of an independent trained                             observer to assist in the monitoring of the                            patient's level of consciousness and physiological                            status; initial 15 minutes of intra-service time;                            patient age 40 years or older (additional time may                            be reported with [phone], as appropriate) Diagnosis Code(s):        --- Professional ---                           K29.70, Gastritis, unspecified, without bleeding                           K31.1, Adult hypertrophic pyloric stenosis                           D64.9, Anemia, unspecified CPT copyright 2018 [employer] Association. All rights reserved. The codes documented in this report are preliminary and upon coder review may  be revised to meet current compliance requirements. Barney Drain, MD Barney Drain MD, MD 02/07/2018 9:24:16 AM This report has been signed electronically. Number of Addenda: 0

## 2018-02-08 ENCOUNTER — Telehealth: Payer: Self-pay | Admitting: Gastroenterology

## 2018-02-08 NOTE — Telephone Encounter (Signed)
PATIENT SCHEDULED  °

## 2018-02-08 NOTE — Telephone Encounter (Signed)
Pt is aware.  

## 2018-02-08 NOTE — Telephone Encounter (Signed)
PLEASE CALL PT. HER GIVENS STUDY IS NORMAL. SHE SHOULD:  Plan: 1. SEE DR. Lindi Adie TO DISCUSS ADDITIONAL WORKUP FOR HER MICROCYTIC ANEMIA 2. SHE HAS NO INDICATION FOR ENDOSCOPY AT THIS TIME. WE COULD CONSIDER A COLONOSCOPY IF HER HEMATOLOGY WORKUP IS NEGATIVE. 3. SHE SHOULD CALL IF SHE SEES BRBPR OR BLACK TARRY STOOLS. 4. OPV IN 4 MOS, DX: MICROCYTIC ANEMIA.

## 2018-02-08 NOTE — Procedures (Signed)
INDICATION: PT WITH MICROCYTIC ANEMIA(Hb 11.1-9.7 SINCE MAY 2019. TCS: 2012/MAY 2018 AND EGD 2012/DEC 2019. NO SOURCE FOR MICROCYTIC ANEMIA IDENTIFIED. GIVENS FOR OBSCURE GI BLEED/MICROCYTIC ANEMIA. PT DENIES BRBPR/MELENA. FERRITIN [phone] SEP-NOV 2019.   PATIENT DATA: WEIGHT:  73.5 kg    HEIGHT: 5' 6.5"     GASTRIC PASSAGE TIME: 10m, SB PASSAGE TIME: 7H 63m  RESULTS: LIMITED views of gastric mucosa due to retained contents.  No MUCOSAL ABNORMALITY, ULCERS, masses or AVMs seen IN SMALL BOWEL.  LIMITED VIEWS OF THE COLON DUE TO RETAINED CONTENTS. No old blood or fresh blood in the stomach, small bowel, or colon.  DIAGNOSIS: NORMAL GIVENS STUDY  Plan: 1. HEMATOLOGY REFERRAL FOR MICROCYTIC ANEMIA 2. NO INDICATION FOR ENDOSCOPY AT THIS TIME. 3. OPV IN 4 MOS

## 2018-02-11 ENCOUNTER — Encounter (HOSPITAL_COMMUNITY): Payer: Self-pay | Admitting: Gastroenterology

## 2018-02-17 ENCOUNTER — Telehealth: Payer: Self-pay

## 2018-02-17 NOTE — Telephone Encounter (Signed)
Pt called to see if our officed received fax about her liver enzymes from her primary dr. Rockey Situ pt that we have no labs received. Provided pt with correct fax number to have pcp send over labs today. Pt verbalized understanding.

## 2018-02-23 ENCOUNTER — Telehealth: Payer: Self-pay

## 2018-02-23 NOTE — Telephone Encounter (Signed)
Pt calling to follow up on her recent lab work results from pcp office. Pt wants to know if she needs to do anything for her abnormal labs. Will have Dr.Gudena review labs and advise pt on what to do.

## 2018-02-25 ENCOUNTER — Telehealth: Payer: Self-pay

## 2018-02-25 NOTE — Telephone Encounter (Signed)
Spoke with pt to discuss her recent lab results that were taken from her PCP office for iron levels. Per Dr.Gudena review, ferretin of 605 (elevated secondary to inflammatory process). 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 Pt states that she has been very tired and just has very low energy. Told pt that her iron levels were okay per Dr.Gudena. Pt to have labs re drawn on her next appt. Suggested that tries aggressive hydration for the next week to see if she feels a little bit better, and to incorporate low impact exercises to help her blood flow and arthritis. Pt also advised to check with her pcp regarding her arthritis management. Pt verbalized understanding and will call with any further questions/concerns.

## 2018-03-18 ENCOUNTER — Other Ambulatory Visit: Payer: Self-pay

## 2018-03-18 DIAGNOSIS — D649 Anemia, unspecified: Secondary | ICD-10-CM

## 2018-03-21 ENCOUNTER — Inpatient Hospital Stay: Payer: BLUE CROSS/BLUE SHIELD | Attending: Hematology and Oncology | Admitting: Hematology and Oncology

## 2018-03-21 ENCOUNTER — Inpatient Hospital Stay: Payer: BLUE CROSS/BLUE SHIELD

## 2018-03-21 ENCOUNTER — Telehealth: Payer: Self-pay | Admitting: Hematology and Oncology

## 2018-03-21 DIAGNOSIS — R232 Flushing: Secondary | ICD-10-CM | POA: Diagnosis not present

## 2018-03-21 DIAGNOSIS — M797 Fibromyalgia: Secondary | ICD-10-CM | POA: Insufficient documentation

## 2018-03-21 DIAGNOSIS — Z170 Estrogen receptor positive status [ER+]: Secondary | ICD-10-CM | POA: Insufficient documentation

## 2018-03-21 DIAGNOSIS — Z79899 Other long term (current) drug therapy: Secondary | ICD-10-CM | POA: Diagnosis not present

## 2018-03-21 DIAGNOSIS — D509 Iron deficiency anemia, unspecified: Secondary | ICD-10-CM | POA: Diagnosis not present

## 2018-03-21 DIAGNOSIS — Z79810 Long term (current) use of selective estrogen receptor modulators (SERMs): Secondary | ICD-10-CM

## 2018-03-21 DIAGNOSIS — Z7984 Long term (current) use of oral hypoglycemic drugs: Secondary | ICD-10-CM | POA: Diagnosis not present

## 2018-03-21 DIAGNOSIS — D0512 Intraductal carcinoma in situ of left breast: Secondary | ICD-10-CM | POA: Diagnosis not present

## 2018-03-21 DIAGNOSIS — Z923 Personal history of irradiation: Secondary | ICD-10-CM | POA: Diagnosis not present

## 2018-03-21 DIAGNOSIS — D649 Anemia, unspecified: Secondary | ICD-10-CM

## 2018-03-21 LAB — CBC WITH DIFFERENTIAL (CANCER CENTER ONLY)
Abs Immature Granulocytes: 0.02 10*3/uL (ref 0.00–0.07)
Basophils Absolute: 0 10*3/uL (ref 0.0–0.1)
Basophils Relative: 1 %
EOS ABS: 0 10*3/uL (ref 0.0–0.5)
EOS PCT: 1 %
HCT: 34.5 % — ABNORMAL LOW (ref 36.0–46.0)
Hemoglobin: 10.9 g/dL — ABNORMAL LOW (ref 12.0–15.0)
Immature Granulocytes: 1 %
Lymphocytes Relative: 25 %
Lymphs Abs: 1 10*3/uL (ref 0.7–4.0)
MCH: 23.1 pg — ABNORMAL LOW (ref 26.0–34.0)
MCHC: 31.6 g/dL (ref 30.0–36.0)
MCV: 73.1 fL — ABNORMAL LOW (ref 80.0–100.0)
Monocytes Absolute: 0.3 10*3/uL (ref 0.1–1.0)
Monocytes Relative: 7 %
Neutro Abs: 2.5 10*3/uL (ref 1.7–7.7)
Neutrophils Relative %: 65 %
Platelet Count: 283 10*3/uL (ref 150–400)
RBC: 4.72 MIL/uL (ref 3.87–5.11)
RDW: 14.8 % (ref 11.5–15.5)
WBC: 3.9 10*3/uL — AB (ref 4.0–10.5)
nRBC: 0 % (ref 0.0–0.2)

## 2018-03-21 LAB — CMP (CANCER CENTER ONLY)
ALT: 21 U/L (ref 0–44)
AST: 21 U/L (ref 15–41)
Albumin: 4.5 g/dL (ref 3.5–5.0)
Alkaline Phosphatase: 45 U/L (ref 38–126)
Anion gap: 12 (ref 5–15)
BILIRUBIN TOTAL: 0.9 mg/dL (ref 0.3–1.2)
BUN: 17 mg/dL (ref 6–20)
CO2: 29 mmol/L (ref 22–32)
CREATININE: 1.18 mg/dL — AB (ref 0.44–1.00)
Calcium: 9.8 mg/dL (ref 8.9–10.3)
Chloride: 101 mmol/L (ref 98–111)
GFR, EST NON AFRICAN AMERICAN: 50 mL/min — AB (ref >60)
GFR, Est AFR Am: 58 mL/min — ABNORMAL LOW (ref >60)
Glucose, Bld: 125 mg/dL — ABNORMAL HIGH (ref 70–99)
Potassium: 3.1 mmol/L — ABNORMAL LOW (ref 3.5–5.1)
Sodium: 142 mmol/L (ref 135–145)
Total Protein: 7.5 g/dL (ref 6.5–8.1)

## 2018-03-21 NOTE — Telephone Encounter (Signed)
Gave avs and calendar ° °

## 2018-03-21 NOTE — Assessment & Plan Note (Signed)
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 Breast conserving surgery: 01/30/16: ADH 2. Followed by adjuvant radiation therapy 3. Followed by antiestrogen therapy with tamoxifen 5 yearsstarted 04/28/2016  Tamoxifen toxicities: 1.Occasional hot flashes 2.Myalgias  Breast cancer surveillance: 1.  Breast exam 12/29/2017: Benign 2. mammogram 12/08/2017: Benign breast density category C

## 2018-03-21 NOTE — Progress Notes (Signed)
Patient Care Team: Monico Blitz, [name], MD (Gastroenterology) Delice Bison Charlestine Massed, NP as Nurse Practitioner (Hematology and Oncology) Nicholas Lose, MD as Consulting Physician (Hematology and Oncology) Eppie Gibson, MD as Attending Physician (Radiation Oncology) [name], MD as Consulting Physician (General Surgery)  DIAGNOSIS:  Encounter Diagnoses  Name Primary?  . Ductal carcinoma in situ (DCIS) of left breast   . Anemia, unspecified type     SUMMARY OF ONCOLOGIC HISTORY:   Ductal carcinoma in situ (DCIS) of left breast   12/31/2015 Initial Diagnosis    Left breast [name] biopsy: DCIS with calcifications ER 90%, PR -50%, high-grade    01/30/2016 Surgery    Left Lumpectomy: No residual DCIS, fibrocystic changes with UDH    02/27/2016 - 04/16/2016 Radiation Therapy    Adjuvant radiation therapy in [facility]    04/28/2016 -  Anti-estrogen oral therapy    Tamoxifen 20 mg daily 5 years     CHIEF COMPLIANT: Follow-up of  Anemia and DCIS  INTERVAL HISTORY: Kerri Shah is a 63-year with above-mentioned history of left breast DCIS also has anemia for which she had undergone extensive work-up in November 2019.  She is here for 24-monthfollow-up.  She appears to have better energy levels.  She has severe microcytic anemia but work-up was negative for thalassemia as well as iron deficiency.  REVIEW OF SYSTEMS:   Constitutional: Denies fevers, chills or abnormal weight loss Eyes: Denies blurriness of vision Ears, nose, mouth, throat, and face: Denies mucositis or sore throat Respiratory: Denies cough, dyspnea or wheezes Cardiovascular: Denies palpitation, chest discomfort Gastrointestinal:  Denies nausea, heartburn or change in bowel habits Skin: Denies abnormal skin rashes Lymphatics: Denies new lymphadenopathy or easy bruising Neurological:Denies numbness, tingling or new weaknesses Behavioral/Psych: Mood is stable, no  new changes  Extremities: No lower extremity edema Breast:  denies any pain or lumps or nodules in either breasts All other systems were reviewed with the patient and are negative.  I have reviewed the past medical history, past surgical history, social history and family history with the patient and they are unchanged from previous note.  ALLERGIES:  has No Known Allergies.  MEDICATIONS:  Current Outpatient Medications  Medication Sig Dispense Refill  . lisinopril-hydrochlorothiazide (PRINZIDE,ZESTORETIC) 10-12.5 MG tablet Take 1 tablet by mouth at bedtime.  1  . metFORMIN (GLUCOPHAGE-XR) 500 MG 24 hr tablet Take 500 mg by mouth at bedtime.   1  . Multiple Vitamins-Minerals (MULTIVITAMIN WITH MINERALS) tablet Take 1 tablet by mouth daily.    .Marland Kitchenomeprazole (PRILOSEC) 20 MG capsule TAKE 1 CAPSULE DAILY. (Patient taking differently: TAKE 1 CAPSULE (20 MG) BY MOUTH ONCE DAILY AT NIGHT.) 30 capsule 5  . pravastatin (PRAVACHOL) 40 MG tablet Take 40 mg by mouth at bedtime.   3  . tamoxifen (NOLVADEX) 20 MG tablet Take 1 tablet (20 mg total) by mouth daily. (Patient taking differently: Take 20 mg by mouth at bedtime. ) 90 tablet 3   No current facility-administered medications for this visit.     PHYSICAL EXAMINATION: ECOG PERFORMANCE STATUS: 1 - Symptomatic but completely ambulatory  Vitals:   03/21/18 0951  BP: (!) 133/111  Pulse: 72  Resp: 17  Temp: 97.7 F (36.5 C)  SpO2: 100%   Filed Weights   03/21/18 0951  Weight: 160 lb 14.4 oz (73 kg)    GENERAL:alert, no distress and comfortable SKIN: skin color, texture, turgor are normal, no rashes or significant lesions  EYES: normal, Conjunctiva are pink and non-injected, sclera clear OROPHARYNX:no exudate, no erythema and lips, buccal mucosa, and tongue normal  NECK: supple, thyroid normal size, non-tender, without nodularity LYMPH:  no palpable lymphadenopathy in the cervical, axillary or inguinal LUNGS: clear to auscultation and  percussion with normal breathing effort HEART: regular rate & rhythm and no murmurs and no lower extremity edema ABDOMEN:abdomen soft, non-tender and normal bowel sounds MUSCULOSKELETAL:no cyanosis of digits and no clubbing  NEURO: alert & oriented x 3 with fluent speech, no focal motor/sensory deficits EXTREMITIES: No lower extremity edema   LABORATORY DATA:  I have reviewed the data as listed CMP Latest Ref Rng & Units 12/29/2017 06/25/2017 11/04/2016  Glucose 70 - 99 mg/dL 118(H) 128(H) -  BUN 6 - 20 mg/dL 16 20 -  Creatinine 0.44 - 1.00 mg/dL 1.43(H) 0.84 -  Sodium 135 - 145 mmol/L 142 137 -  Potassium 3.5 - 5.1 mmol/L 3.7 3.8 -  Chloride 98 - 111 mmol/L 105 102 -  CO2 22 - 32 mmol/L 28 27 -  Calcium 8.9 - 10.3 mg/dL 9.8 9.2 -  Total Protein 6.5 - 8.1 g/dL 7.3 7.3 6.7  Total Bilirubin 0.3 - 1.2 mg/dL 0.3 0.6 0.4  Alkaline Phos 38 - 126 U/L 38 34(L) -  AST 15 - 41 U/L '17 24 14  '$ ALT 0 - 44 U/L '15 25 15    '$ Lab Results  Component Value Date   WBC 3.9 (L) 03/21/2018   HGB 10.9 (L) 03/21/2018   HCT 34.5 (L) 03/21/2018   MCV 73.1 (L) 03/21/2018   PLT 283 03/21/2018   NEUTROABS 2.5 03/21/2018    ASSESSMENT & PLAN:  Ductal carcinoma in situ (DCIS) of left breast Left breast [name] biopsy 12/31/2015: 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 Breast conserving surgery: 01/30/16: ADH 2. Followed by adjuvant radiation therapy 3. Followed by antiestrogen therapy with tamoxifen 5 yearsstarted 04/28/2016  Tamoxifen toxicities: 1.Occasional hot flashes 2.Myalgias  Breast cancer surveillance: 1.  Breast exam 12/29/2017: Benign 2. mammogram 12/08/2017: Benign breast density category C   Anemia Chronic microcytic anemia: Labs: 12/29/2017:  Iron studies: 13% iron saturation, TIBC 396, ferritin 407 and hemoglobin electrophoresis were normal As were vitamin P11 and folic acid Erythropoietin 12.3  Lab review 03/21/2018: Hemoglobin is  10.9 Differential diagnosis of microcytosis: Patient does not appear to have iron deficiency or thalassemia.  The differential further includes sideroblastic anemia.  Which can only be diagnosed with a bone marrow biopsy.  I do not intend to do a bone marrow biopsy at this time. Since her hemoglobin is improving we decided to watch and monitor and I will see the patient back in 6 months with labs and follow-up.  I will call the patient with the results of her kidney function.    No orders of the defined types were placed in this encounter.  The patient has a good understanding of the overall plan. she agrees with it. she will call with any problems that may develop before the next visit here.   Harriette Ohara, MD 03/21/18

## 2018-03-21 NOTE — Assessment & Plan Note (Signed)
Chronic microcytic anemia: Labs: 12/29/2017:  Iron studies: 13% iron saturation, TIBC 396, ferritin 407 and hemoglobin electrophoresis were normal As were vitamin V22 and folic acid Erythropoietin 12.3  Lab review 03/21/2018:

## 2018-03-22 LAB — ERYTHROPOIETIN: ERYTHROPOIETIN: 23.1 m[IU]/mL — AB (ref 2.6–18.5)

## 2018-03-23 ENCOUNTER — Other Ambulatory Visit: Payer: Self-pay | Admitting: Hematology and Oncology

## 2018-06-08 ENCOUNTER — Other Ambulatory Visit: Payer: Self-pay

## 2018-06-08 ENCOUNTER — Encounter: Payer: Self-pay | Admitting: Gastroenterology

## 2018-06-08 ENCOUNTER — Ambulatory Visit (INDEPENDENT_AMBULATORY_CARE_PROVIDER_SITE_OTHER): Payer: BLUE CROSS/BLUE SHIELD | Admitting: Gastroenterology

## 2018-06-08 DIAGNOSIS — D649 Anemia, unspecified: Secondary | ICD-10-CM

## 2018-06-08 DIAGNOSIS — K760 Fatty (change of) liver, not elsewhere classified: Secondary | ICD-10-CM

## 2018-06-08 NOTE — Patient Instructions (Signed)
1. Follow-up with hematology as planned in August.  We will look for labs as available as well. 2. We will plan to see you back in 1 year for follow-up of fatty liver and anemia. 3. Next colonoscopy due May 2023.   Fatty Liver Disease  Fatty liver disease occurs when too much fat has built up in your liver cells. Fatty liver disease is also called hepatic steatosis or steatohepatitis. The liver removes harmful substances from your bloodstream and produces fluids that your body needs. It also helps your body use and store energy from the food you eat. In many cases, fatty liver disease does not cause symptoms or problems. It is often diagnosed when tests are being done for other reasons. However, over time, fatty liver can cause inflammation that may lead to more serious liver problems, such as scarring of the liver (cirrhosis) and liver failure. Fatty liver is associated with insulin resistance, increased body fat, high blood pressure (hypertension), and high cholesterol. These are features of metabolic syndrome and increase your risk for stroke, diabetes, and heart disease. What are the causes? This condition may be caused by:  Drinking too much alcohol.  Poor nutrition.  Obesity.  Cushing's syndrome.  Diabetes.  High cholesterol.  Certain drugs.  Poisons.  Some viral infections.  Pregnancy. What increases the risk? You are more likely to develop this condition if you:  Abuse alcohol.  Are overweight.  Have diabetes.  Have hepatitis.  Have a high triglyceride level.  Are pregnant. What are the signs or symptoms? Fatty liver disease often does not cause symptoms. If symptoms do develop, they can include:  Fatigue.  Weakness.  Weight loss.  Confusion.  Abdominal pain.  Nausea and vomiting.  Yellowing of your skin and the white parts of your eyes (jaundice).  Itchy skin. How is this diagnosed? This condition may be diagnosed by:  A physical exam and  medical history.  Blood tests.  Imaging tests, such as an ultrasound, CT scan, or MRI.  A liver biopsy. A small sample of liver tissue is removed using a needle. The sample is then looked at under a microscope. How is this treated? Fatty liver disease is often caused by other health conditions. Treatment for fatty liver may involve medicines and lifestyle changes to manage conditions such as:  Alcoholism.  High cholesterol.  Diabetes.  Being overweight or obese. Follow these instructions at home:   Do not drink alcohol. If you have trouble quitting, ask your health care provider how to safely quit with the help of medicine or a supervised program. This is important to keep your condition from getting worse.  Eat a healthy diet as told by your health care provider. Ask your health care provider about working with a diet and nutrition specialist (dietitian) to develop an eating plan.  Exercise regularly. This can help you lose weight and control your cholesterol and diabetes. Talk to your health care provider about an exercise plan and which activities are best for you.  Take over-the-counter and prescription medicines only as told by your health care provider.  Keep all follow-up visits as told by your health care provider. This is important. Contact a health care provider if: You have trouble controlling your:  Blood sugar. This is especially important if you have diabetes.  Cholesterol.  Drinking of alcohol. Get help right away if:  You have abdominal pain.  You have jaundice.  You have nausea and vomiting.  You vomit blood or material that  looks like coffee grounds.  You have stools that are black, tar-like, or bloody. Summary  Fatty liver disease develops when too much fat builds up in the cells of your liver.  Fatty liver disease often causes no symptoms or problems. However, over time, fatty liver can cause inflammation that may lead to more serious liver  problems, such as scarring of the liver (cirrhosis).  You are more likely to develop this condition if you abuse alcohol, are pregnant, are overweight, have diabetes, have hepatitis, or have high triglyceride levels.  Contact your health care provider if you have trouble controlling your weight, blood sugar, cholesterol, or drinking of alcohol. This information is not intended to replace advice given to you by your health care provider. Make sure you discuss any questions you have with your health care provider. Document Released: 03/13/2005 Document Revised: 11/04/2016 Document Reviewed: 11/04/2016 Elsevier Interactive Patient Education  2019 [employer].

## 2018-06-08 NOTE — Progress Notes (Signed)
Primary Care Physician:  Monico Blitz, MD Primary GI:  Barney Drain, MD   Patient Location: Home  Provider Location: [facility] office  Reason for Phone Visit: microcytic anemia  Persons present on the phone encounter, with roles: Patient, myself (provider), Zara Council, LPN (updated meds and allergies)  Total time (minutes) spent on medical discussion: 12  Due to COVID-19, visit was conducted using telephonic method (no video was available).  Visit was requested by patient.  Virtual Visit via Telephone only  I connected with Ms. Heavner on 06/08/18 at  1:30 PM EDT by telephone and verified that I am speaking with the correct person using two identifiers.   I discussed the limitations, risks, security and privacy concerns of performing an evaluation and management service by telephone and the availability of in person appointments. I also discussed with the patient that there may be a patient responsible charge related to this service. The patient expressed understanding and agreed to proceed.   HPI:   Patient is a pleasant 58 year old female who presents for telephone visit regarding microcytic anemia.  Also with leukopenia.  Colonoscopy 2018, single adenoma removed, one hyperplastic polyp.  Next colonoscopy planned for 2023 because of family history of colon cancer, mother and brother.  She has a history of fatty liver on prior ultrasound in 2018.  She had a EGD February 07, 2018 to evaluate microcytic anemia.  She was heme-negative. She had mild gastritis, pyloric stenosis status post dilation.  Capsule endoscopy was placed at the same time, it was normal.  She was referred to hematology for microcytic anemia.  It is notable that her ferritin has been elevated in the 500-600 range, iron studies unremarkable.  Labs are being monitored at this time, bone marrow biopsy on hold.  Eating much better. No bloating, N/V. No abdominal pain. BM regular, on fiber supplement and diet. BM about  every other day. No melena, brbpr. No heartburn, dysphagia. Energy somewhat better, some days better than other days. Weight today 166, up from 160 pounds but not back at baseline.  Current Outpatient Medications  Medication Sig Dispense Refill  . metFORMIN (GLUCOPHAGE-XR) 500 MG 24 hr tablet Take 500 mg by mouth at bedtime.   1  . Multiple Vitamins-Minerals (MULTIVITAMIN WITH MINERALS) tablet Take 1 tablet by mouth daily.    . pravastatin (PRAVACHOL) 40 MG tablet Take 40 mg by mouth at bedtime.   3  . tamoxifen (NOLVADEX) 20 MG tablet Take 1 tablet (20 mg total) by mouth at bedtime. 90 tablet 3   No current facility-administered medications for this visit.     ROS:  General: Negative for anorexia, weight loss, fever, chills, fatigue, weakness.  See HPI Eyes: Negative for vision changes.  ENT: Negative for hoarseness, difficulty swallowing , nasal congestion. CV: Negative for chest pain, angina, palpitations, dyspnea on exertion, peripheral edema.  Respiratory: Negative for dyspnea at rest, dyspnea on exertion, cough, sputum, wheezing.  GI: See history of present illness. GU:  Negative for dysuria, hematuria, urinary incontinence, urinary frequency, nocturnal urination.  MS: Negative for joint pain, low back pain.  Derm: Negative for rash or itching.  Neuro: Negative for weakness, abnormal sensation, seizure, frequent headaches, memory loss, confusion.  Psych: Negative for anxiety, depression, suicidal ideation, hallucinations.  Endo: Negative for unusual weight change.  Heme: Negative for bruising or bleeding. Allergy: Negative for rash or hives.   Observations/Objective: Pleasant and cooperative female in no acute distress.  Exam otherwise unavailable.  Lab Results  Component Value Date   CREATININE 1.18 (H) 03/21/2018   BUN 17 03/21/2018   NA 142 03/21/2018   K 3.1 (L) 03/21/2018   CL 101 03/21/2018   CO2 29 03/21/2018   Lab Results  Component Value Date   ALT 21  03/21/2018   AST 21 03/21/2018   ALKPHOS 45 03/21/2018   BILITOT 0.9 03/21/2018   Lab Results  Component Value Date   WBC 3.9 (L) 03/21/2018   HGB 10.9 (L) 03/21/2018   HCT 34.5 (L) 03/21/2018   MCV 73.1 (L) 03/21/2018   PLT 283 03/21/2018   Lab Results  Component Value Date   IRON 51 12/29/2017   TIBC 396 12/29/2017   FERRITIN 407 (H) 12/29/2017     Assessment and Plan: Pleasant 58 year old female with history of fatty liver, microscopic anemia presenting for follow-up.  She is now following with her oncologist regarding her microcytic anemia.  She has a follow-up planned in August with further blood work.  Bone marrow biopsy on hold unless labs decline.  Clinically she is feeling much better.  Labs in February indicated improvement in her hemoglobin.  LFTs were normal.  She has a history of elevated ferritin which is being rechecked by hematology in August.  We will plan to see her back in 1 year for fatty liver and anemia.  We will follow-up labs that are being done in August by hematology.  Her next colonoscopy is planned in May 2023.  Follow Up Instructions:    I discussed the assessment and treatment plan with the patient. The patient was provided an opportunity to ask questions and all were answered. The patient agreed with the plan and demonstrated an understanding of the instructions. AVS mailed to patient's home address.   The patient was advised to call back or seek an in-person evaluation if the symptoms worsen or if the condition fails to improve as anticipated.  I provided 12 minutes of non-face-to-face time during this encounter.   Neil Crouch, PA-C

## 2018-06-09 NOTE — Progress Notes (Signed)
CC'D TO PCP °

## 2018-07-08 ENCOUNTER — Other Ambulatory Visit: Payer: Self-pay | Admitting: Student

## 2018-07-08 DIAGNOSIS — N6452 Nipple discharge: Secondary | ICD-10-CM

## 2018-07-13 ENCOUNTER — Other Ambulatory Visit: Payer: BLUE CROSS/BLUE SHIELD

## 2018-07-20 ENCOUNTER — Other Ambulatory Visit: Payer: Self-pay

## 2018-07-20 ENCOUNTER — Ambulatory Visit
Admission: RE | Admit: 2018-07-20 | Discharge: 2018-07-20 | Disposition: A | Discharge status: Home / Self Care | Payer: BC Managed Care – PPO | Source: Ambulatory Visit | Attending: Student | Admitting: Student

## 2018-07-20 DIAGNOSIS — N6452 Nipple discharge: Secondary | ICD-10-CM

## 2018-08-02 ENCOUNTER — Other Ambulatory Visit: Payer: Self-pay | Admitting: General Surgery

## 2018-08-02 DIAGNOSIS — N6452 Nipple discharge: Secondary | ICD-10-CM

## 2018-08-03 ENCOUNTER — Ambulatory Visit
Admission: RE | Admit: 2018-08-03 | Discharge: 2018-08-03 | Disposition: A | Discharge status: Home / Self Care | Payer: BC Managed Care – PPO | Source: Ambulatory Visit | Attending: General Surgery | Admitting: General Surgery

## 2018-08-03 DIAGNOSIS — N6452 Nipple discharge: Secondary | ICD-10-CM

## 2018-08-03 MED ORDER — GADOBUTROL 1 MMOL/ML IV SOLN
8.0000 mL | Freq: Once | INTRAVENOUS | Status: AC | PRN
  Administered 2018-08-03: 8 mL via INTRAVENOUS

## 2018-08-06 NOTE — Progress Notes (Signed)
Please let pt know that we could try aspirating the seroma that is likely causing the discharge if she desires.  Also, we need a follow up MRI in 6 months.

## 2018-09-19 ENCOUNTER — Other Ambulatory Visit: Payer: Self-pay

## 2018-09-19 ENCOUNTER — Inpatient Hospital Stay: Payer: BC Managed Care – PPO | Attending: Hematology and Oncology

## 2018-09-19 DIAGNOSIS — D0512 Intraductal carcinoma in situ of left breast: Secondary | ICD-10-CM | POA: Insufficient documentation

## 2018-09-19 DIAGNOSIS — D649 Anemia, unspecified: Secondary | ICD-10-CM

## 2018-09-19 DIAGNOSIS — Z170 Estrogen receptor positive status [ER+]: Secondary | ICD-10-CM | POA: Diagnosis not present

## 2018-09-19 LAB — CBC WITH DIFFERENTIAL (CANCER CENTER ONLY)
Abs Immature Granulocytes: 0.01 10*3/uL (ref 0.00–0.07)
Basophils Absolute: 0 10*3/uL (ref 0.0–0.1)
Basophils Relative: 1 %
Eosinophils Absolute: 0 10*3/uL (ref 0.0–0.5)
Eosinophils Relative: 1 %
HCT: 32.4 % — ABNORMAL LOW (ref 36.0–46.0)
Hemoglobin: 10 g/dL — ABNORMAL LOW (ref 12.0–15.0)
Immature Granulocytes: 0 %
Lymphocytes Relative: 32 %
Lymphs Abs: 1 10*3/uL (ref 0.7–4.0)
MCH: 22.9 pg — ABNORMAL LOW (ref 26.0–34.0)
MCHC: 30.9 g/dL (ref 30.0–36.0)
MCV: 74.1 fL — ABNORMAL LOW (ref 80.0–100.0)
Monocytes Absolute: 0.3 10*3/uL (ref 0.1–1.0)
Monocytes Relative: 9 %
Neutro Abs: 1.7 10*3/uL (ref 1.7–7.7)
Neutrophils Relative %: 57 %
Platelet Count: 256 10*3/uL (ref 150–400)
RBC: 4.37 MIL/uL (ref 3.87–5.11)
RDW: 15 % (ref 11.5–15.5)
WBC Count: 3.1 10*3/uL — ABNORMAL LOW (ref 4.0–10.5)
nRBC: 0 % (ref 0.0–0.2)

## 2018-09-19 LAB — IRON AND TIBC
Iron: 60 ug/dL (ref 41–142)
Saturation Ratios: 17 % — ABNORMAL LOW (ref 21–57)
TIBC: 355 ug/dL (ref 236–444)
UIBC: 296 ug/dL (ref 120–384)

## 2018-09-19 LAB — FERRITIN: Ferritin: 338 ng/mL — ABNORMAL HIGH (ref 11–307)

## 2018-10-07 ENCOUNTER — Other Ambulatory Visit: Payer: Self-pay | Admitting: *Deleted

## 2018-10-07 DIAGNOSIS — D649 Anemia, unspecified: Secondary | ICD-10-CM

## 2018-11-22 ENCOUNTER — Other Ambulatory Visit: Payer: Self-pay | Admitting: General Surgery

## 2018-11-22 DIAGNOSIS — Z853 Personal history of malignant neoplasm of breast: Secondary | ICD-10-CM

## 2018-12-12 ENCOUNTER — Other Ambulatory Visit: Payer: Self-pay

## 2018-12-12 ENCOUNTER — Ambulatory Visit
Admission: RE | Admit: 2018-12-12 | Discharge: 2018-12-12 | Disposition: A | Discharge status: Home / Self Care | Payer: BC Managed Care – PPO | Source: Ambulatory Visit | Attending: General Surgery | Admitting: General Surgery

## 2018-12-12 DIAGNOSIS — Z853 Personal history of malignant neoplasm of breast: Secondary | ICD-10-CM

## 2018-12-12 NOTE — Progress Notes (Signed)
Can you make sure order is in for MRI in December 2020? tx FB

## 2018-12-26 ENCOUNTER — Inpatient Hospital Stay: Payer: BC Managed Care – PPO | Attending: Hematology and Oncology

## 2018-12-26 ENCOUNTER — Other Ambulatory Visit: Payer: Self-pay

## 2018-12-26 DIAGNOSIS — D509 Iron deficiency anemia, unspecified: Secondary | ICD-10-CM | POA: Insufficient documentation

## 2018-12-26 DIAGNOSIS — Z7984 Long term (current) use of oral hypoglycemic drugs: Secondary | ICD-10-CM | POA: Insufficient documentation

## 2018-12-26 DIAGNOSIS — R232 Flushing: Secondary | ICD-10-CM | POA: Insufficient documentation

## 2018-12-26 DIAGNOSIS — Z79899 Other long term (current) drug therapy: Secondary | ICD-10-CM | POA: Diagnosis not present

## 2018-12-26 DIAGNOSIS — Z170 Estrogen receptor positive status [ER+]: Secondary | ICD-10-CM | POA: Diagnosis not present

## 2018-12-26 DIAGNOSIS — D0512 Intraductal carcinoma in situ of left breast: Secondary | ICD-10-CM | POA: Diagnosis not present

## 2018-12-26 DIAGNOSIS — M791 Myalgia, unspecified site: Secondary | ICD-10-CM | POA: Insufficient documentation

## 2018-12-26 DIAGNOSIS — D649 Anemia, unspecified: Secondary | ICD-10-CM

## 2018-12-26 LAB — CBC WITH DIFFERENTIAL (CANCER CENTER ONLY)
Abs Immature Granulocytes: 0.01 10*3/uL (ref 0.00–0.07)
Basophils Absolute: 0 10*3/uL (ref 0.0–0.1)
Basophils Relative: 1 %
Eosinophils Absolute: 0 10*3/uL (ref 0.0–0.5)
Eosinophils Relative: 1 %
HCT: 34.7 % — ABNORMAL LOW (ref 36.0–46.0)
Hemoglobin: 10.8 g/dL — ABNORMAL LOW (ref 12.0–15.0)
Immature Granulocytes: 0 %
Lymphocytes Relative: 32 %
Lymphs Abs: 1 10*3/uL (ref 0.7–4.0)
MCH: 22.9 pg — ABNORMAL LOW (ref 26.0–34.0)
MCHC: 31.1 g/dL (ref 30.0–36.0)
MCV: 73.7 fL — ABNORMAL LOW (ref 80.0–100.0)
Monocytes Absolute: 0.3 10*3/uL (ref 0.1–1.0)
Monocytes Relative: 8 %
Neutro Abs: 1.9 10*3/uL (ref 1.7–7.7)
Neutrophils Relative %: 58 %
Platelet Count: 264 10*3/uL (ref 150–400)
RBC: 4.71 MIL/uL (ref 3.87–5.11)
RDW: 15.4 % (ref 11.5–15.5)
WBC Count: 3.2 10*3/uL — ABNORMAL LOW (ref 4.0–10.5)
nRBC: 0 % (ref 0.0–0.2)

## 2018-12-26 LAB — IRON AND TIBC
Iron: 63 ug/dL (ref 41–142)
Saturation Ratios: 16 % — ABNORMAL LOW (ref 21–57)
TIBC: 393 ug/dL (ref 236–444)
UIBC: 330 ug/dL (ref 120–384)

## 2018-12-26 LAB — VITAMIN B12: Vitamin B-12: 950 pg/mL — ABNORMAL HIGH (ref 180–914)

## 2018-12-26 LAB — FOLATE: Folate: 34.6 ng/mL (ref >5.9)

## 2018-12-26 LAB — FERRITIN: Ferritin: 441 ng/mL — ABNORMAL HIGH (ref 11–307)

## 2018-12-27 LAB — HEMOGLOBINOPATHY EVALUATION
Hgb A2 Quant: 2.1 % (ref 1.8–3.2)
Hgb A: 97.9 % (ref 96.4–98.8)
Hgb C: 0 %
Hgb F Quant: 0 % (ref 0.0–2.0)
Hgb S Quant: 0 %
Hgb Variant: 0 %

## 2018-12-27 LAB — ERYTHROPOIETIN: Erythropoietin: 21.5 m[IU]/mL — ABNORMAL HIGH (ref 2.6–18.5)

## 2019-01-02 NOTE — Progress Notes (Signed)
Patient Care Team: Monico Blitz, [name], MD (Gastroenterology) Delice Bison Charlestine Massed, NP as Nurse Practitioner (Hematology and Oncology) Nicholas Lose, MD as Consulting Physician (Hematology and Oncology) Eppie Gibson, MD as Attending Physician (Radiation Oncology) [name], MD as Consulting Physician (General Surgery)  DIAGNOSIS:    ICD-10-CM   1. 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     SUMMARY OF ONCOLOGIC HISTORY: Oncology History  Ductal carcinoma in situ (DCIS) of left breast  12/31/2015 Initial Diagnosis   Left breast [name] biopsy: DCIS with calcifications ER 90%, PR -50%, high-grade   01/30/2016 Surgery   Left Lumpectomy: No residual DCIS, fibrocystic changes with UDH   02/27/2016 - 04/16/2016 Radiation Therapy   Adjuvant radiation therapy in [facility]   04/28/2016 -  Anti-estrogen oral therapy   Tamoxifen 20 mg daily 5 years     CHIEF COMPLIANT: Follow-up of left breast DCIS and anemia  INTERVAL HISTORY: Kerri Shah is a 58 y.o. with above-mentioned history of left breast DCIS treated with lumpectomy, radiation, and who is currently on anti-estrogen therapy with tamoxifen. Mammogram on 12/12/18 showed no evidence of malignancy bilaterally. She also has a history of anemia. Labs on 12/26/18 showed: Hg 10.8, HCT 34.7, MCV 73.7, iron saturation 16%, ferritin 441, folate 34.6, erythropoietin 21.5, B-12 950. She presents to the clinic today for follow-up.  She reports to me that the tiredness is not as bad as before.  She no longer has the bloody nipple discharge.  REVIEW OF SYSTEMS:   Constitutional: Denies fevers, chills or abnormal weight loss Eyes: Denies blurriness of vision Ears, nose, mouth, throat, and face: Denies mucositis or sore throat Respiratory: Denies cough, dyspnea or wheezes Cardiovascular: Denies palpitation, chest discomfort Gastrointestinal: Denies nausea, heartburn or change  in bowel habits Skin: Denies abnormal skin rashes Lymphatics: Denies new lymphadenopathy or easy bruising Neurological: Denies numbness, tingling or new weaknesses Behavioral/Psych: Mood is stable, no new changes  Extremities: No lower extremity edema Breast: Bloody nipple discharge has subsided possibly from a seroma All other systems were reviewed with the patient and are negative.  I have reviewed the past medical history, past surgical history, social history and family history with the patient and they are unchanged from previous note.  ALLERGIES:  has No Known Allergies.  MEDICATIONS:  Current Outpatient Medications  Medication Sig Dispense Refill  . metFORMIN (GLUCOPHAGE-XR) 500 MG 24 hr tablet Take 500 mg by mouth at bedtime.   1  . Multiple Vitamins-Minerals (MULTIVITAMIN WITH MINERALS) tablet Take 1 tablet by mouth daily.    . pravastatin (PRAVACHOL) 40 MG tablet Take 40 mg by mouth at bedtime.   3  . tamoxifen (NOLVADEX) 20 MG tablet Take 1 tablet (20 mg total) by mouth at bedtime. 90 tablet 3   No current facility-administered medications for this visit.     PHYSICAL EXAMINATION: ECOG PERFORMANCE STATUS: 1 - Symptomatic but completely ambulatory  Vitals:   01/03/19 1003  BP: (!) 175/76  Pulse: (!) 106  Resp: 18  Temp: 98.5 F (36.9 C)  SpO2: 100%   Filed Weights   01/03/19 1003  Weight: 172 lb 11.2 oz (78.3 kg)    GENERAL: alert, no distress and comfortable SKIN: skin color, texture, turgor are normal, no rashes or significant lesions EYES: normal, Conjunctiva are pink and non-injected, sclera clear OROPHARYNX: no exudate, no erythema and lips, buccal mucosa, and tongue normal  NECK: supple, thyroid normal size, non-tender,  without nodularity LYMPH: no palpable lymphadenopathy in the cervical, axillary or inguinal LUNGS: clear to auscultation and percussion with normal breathing effort HEART: regular rate & rhythm and no murmurs and no lower extremity edema  ABDOMEN: abdomen soft, non-tender and normal bowel sounds MUSCULOSKELETAL: no cyanosis of digits and no clubbing  NEURO: alert & oriented x 3 with fluent speech, no focal motor/sensory deficits EXTREMITIES: No lower extremity edema    LABORATORY DATA:  I have reviewed the data as listed CMP Latest Ref Rng & Units 03/21/2018 12/29/2017 06/25/2017  Glucose 70 - 99 mg/dL 125(H) 118(H) 128(H)  BUN 6 - 20 mg/dL 17 16 20   Creatinine 0.44 - 1.00 mg/dL 1.18(H) 1.43(H) 0.84  Sodium 135 - 145 mmol/L 142 142 137  Potassium 3.5 - 5.1 mmol/L 3.1(L) 3.7 3.8  Chloride 98 - 111 mmol/L 101 105 102  CO2 22 - 32 mmol/L 29 28 27   Calcium 8.9 - 10.3 mg/dL 9.8 9.8 9.2  Total Protein 6.5 - 8.1 g/dL 7.5 7.3 7.3  Total Bilirubin 0.3 - 1.2 mg/dL 0.9 0.3 0.6  Alkaline Phos 38 - 126 U/L 45 38 34(L)  AST 15 - 41 U/L 21 17 24   ALT 0 - 44 U/L 21 15 25     Lab Results  Component Value Date   WBC 3.2 (L) 12/26/2018   HGB 10.8 (L) 12/26/2018   HCT 34.7 (L) 12/26/2018   MCV 73.7 (L) 12/26/2018   PLT 264 12/26/2018   NEUTROABS 1.9 12/26/2018    ASSESSMENT & PLAN:  Ductal carcinoma in situ (DCIS) of left breast Left breast [name] biopsy 12/31/2015: 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 Breast conserving surgery: 01/30/16: ADH 2. Followed by adjuvant radiation therapy 3. Followed by antiestrogen therapy with tamoxifen 5 yearsstarted 04/28/2016  Tamoxifen toxicities: 1.Occasional hot flashes 2.Myalgias  Breast cancer surveillance: 1.Breast exam 01/03/2019: Benign 2.mammogram 12/12/2018:Benignbreast density category C 3.  Breast MRI 08/03/2018: Done to evaluate bloody nipple discharge: Left breast seroma, 4 mm possibly benign enhancement left areola.?  Repeat breast MRI  Radiology review: I discussed the results of the breast MRI.  Given the fact that it is recommending a repeat breast MRI in 6 months we will proceed to order that scan.   Microcytic anemia:  Hemoglobin 10.8, MCV 73.7 ferritin 441, iron saturation 15% Hemoglobin electrophoresis: Normal hemoglobin  Follow-up in 1 year    No orders of the defined types were placed in this encounter.  The patient has a good understanding of the overall plan. she agrees with it. she will call with any problems that may develop before the next visit here.  Nicholas Lose, MD 01/03/2019  Julious Oka Dorshimer, am acting as scribe for Dr. Nicholas Lose.  I have reviewed the above documentation for accuracy and completeness, and I agree with the above.

## 2019-01-03 ENCOUNTER — Other Ambulatory Visit: Payer: Self-pay

## 2019-01-03 ENCOUNTER — Inpatient Hospital Stay (HOSPITAL_BASED_OUTPATIENT_CLINIC_OR_DEPARTMENT_OTHER): Payer: BC Managed Care – PPO | Admitting: Hematology and Oncology

## 2019-01-03 VITALS — BP 175/76 | HR 106 | Temp 98.5°F | Resp 18 | Ht 66.5 in | Wt 172.7 lb

## 2019-01-03 DIAGNOSIS — D649 Anemia, unspecified: Secondary | ICD-10-CM | POA: Diagnosis not present

## 2019-01-03 DIAGNOSIS — D0512 Intraductal carcinoma in situ of left breast: Secondary | ICD-10-CM | POA: Diagnosis not present

## 2019-01-03 MED ORDER — TAMOXIFEN CITRATE 20 MG PO TABS: 20.0000 mg | ORAL_TABLET | Freq: Every day | ORAL | 3 refills | Status: DC

## 2019-01-03 NOTE — Assessment & Plan Note (Signed)
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 Breast conserving surgery: 01/30/16: ADH 2. Followed by adjuvant radiation therapy 3. Followed by antiestrogen therapy with tamoxifen 5 yearsstarted 04/28/2016  Tamoxifen toxicities: 1.Occasional hot flashes 2.Myalgias  Breast cancer surveillance: 1.Breast exam 01/03/2019: Benign 2.mammogram 12/12/2018:Benignbreast density category C 3.  Breast MRI 08/03/2018: Done to evaluate bloody nipple discharge: Left breast seroma, 4 mm possibly benign enhancement left areola.?  Repeat breast MRI  Radiology review: I discussed the results of the breast MRI.  Given the fact that it is recommending a repeat breast MRI in 6 months we will proceed to order that scan.  Follow-up in 1 year

## 2019-01-04 ENCOUNTER — Telehealth: Payer: Self-pay | Admitting: Hematology and Oncology

## 2019-01-04 NOTE — Telephone Encounter (Signed)
Scheduled per los. Called and left msg. Mailed printout  °

## 2019-01-19 ENCOUNTER — Other Ambulatory Visit: Payer: Self-pay

## 2019-01-19 ENCOUNTER — Ambulatory Visit
Admission: RE | Admit: 2019-01-19 | Discharge: 2019-01-19 | Disposition: A | Discharge status: Home / Self Care | Payer: BC Managed Care – PPO | Source: Ambulatory Visit | Attending: Hematology and Oncology | Admitting: Hematology and Oncology

## 2019-01-19 DIAGNOSIS — D0512 Intraductal carcinoma in situ of left breast: Secondary | ICD-10-CM

## 2019-01-19 MED ORDER — GADOBUTROL 1 MMOL/ML IV SOLN
8.0000 mL | Freq: Once | INTRAVENOUS | Status: AC | PRN
  Administered 2019-01-19: 8 mL via INTRAVENOUS

## 2019-01-20 ENCOUNTER — Other Ambulatory Visit: Payer: Self-pay | Admitting: *Deleted

## 2019-01-20 DIAGNOSIS — D0512 Intraductal carcinoma in situ of left breast: Secondary | ICD-10-CM

## 2019-01-20 NOTE — Progress Notes (Signed)
Per Dr. Lindi Adie, based off of pt recent breast MRI results, pt needs to be scheduled for left breast US with Biopsy.  Orders placed and scheduled for 01/25/2019 at 3:30 pm.  Follow up apt with Dr. Lindi Adie also scheduled.  Pt notified and verbalized understanding of apt dates and time.

## 2019-01-25 ENCOUNTER — Other Ambulatory Visit: Payer: Self-pay

## 2019-01-25 ENCOUNTER — Other Ambulatory Visit: Payer: Self-pay | Admitting: Hematology and Oncology

## 2019-01-25 ENCOUNTER — Ambulatory Visit
Admission: RE | Admit: 2019-01-25 | Discharge: 2019-01-25 | Disposition: A | Discharge status: Home / Self Care | Payer: BC Managed Care – PPO | Source: Ambulatory Visit | Attending: Hematology and Oncology | Admitting: Hematology and Oncology

## 2019-01-25 DIAGNOSIS — R9389 Abnormal findings on diagnostic imaging of other specified body structures: Secondary | ICD-10-CM

## 2019-01-25 DIAGNOSIS — D0512 Intraductal carcinoma in situ of left breast: Secondary | ICD-10-CM

## 2019-01-26 ENCOUNTER — Other Ambulatory Visit: Payer: Self-pay | Admitting: Hematology and Oncology

## 2019-01-26 DIAGNOSIS — R9389 Abnormal findings on diagnostic imaging of other specified body structures: Secondary | ICD-10-CM

## 2019-01-30 ENCOUNTER — Telehealth: Payer: Self-pay | Admitting: Hematology and Oncology

## 2019-01-30 NOTE — Telephone Encounter (Signed)
Scheduled appt per 12/17 sch message - pt is aware of apt date and time

## 2019-01-31 ENCOUNTER — Inpatient Hospital Stay: Payer: 59 | Admitting: Hematology and Oncology

## 2019-02-08 ENCOUNTER — Other Ambulatory Visit: Payer: Self-pay | Admitting: Diagnostic Radiology

## 2019-02-08 ENCOUNTER — Ambulatory Visit
Admission: RE | Admit: 2019-02-08 | Discharge: 2019-02-08 | Disposition: A | Discharge status: Home / Self Care | Payer: BC Managed Care – PPO | Source: Ambulatory Visit | Attending: Hematology and Oncology | Admitting: Hematology and Oncology

## 2019-02-08 ENCOUNTER — Other Ambulatory Visit: Payer: Self-pay

## 2019-02-08 DIAGNOSIS — R9389 Abnormal findings on diagnostic imaging of other specified body structures: Secondary | ICD-10-CM

## 2019-02-08 MED ORDER — GADOBUTROL 1 MMOL/ML IV SOLN
8.0000 mL | Freq: Once | INTRAVENOUS | Status: AC | PRN
  Administered 2019-02-08: 8 mL via INTRAVENOUS

## 2019-02-13 NOTE — Progress Notes (Signed)
Patient Care Team: Monico Blitz, [name], MD (Gastroenterology) Delice Bison Charlestine Massed, NP as Nurse Practitioner (Hematology and Oncology) Nicholas Lose, MD as Consulting Physician (Hematology and Oncology) Eppie Gibson, MD as Attending Physician (Radiation Oncology) [name], MD as Consulting Physician (General Surgery)  DIAGNOSIS:    ICD-10-CM   1. 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     SUMMARY OF ONCOLOGIC HISTORY: Oncology History  Ductal carcinoma in situ (DCIS) of left breast  12/31/2015 Initial Diagnosis   Left breast [name] biopsy: DCIS with calcifications ER 90%, PR -50%, high-grade   01/30/2016 Surgery   Left Lumpectomy: No residual DCIS, fibrocystic changes with UDH   02/27/2016 - 04/16/2016 Radiation Therapy   Adjuvant radiation [facility] in [facility]   04/28/2016 -  Anti-estrogen oral therapy   Tamoxifen 20 mg daily 5 years   01/19/2019 Breast MRI   Breast MRI showed thickening and focal enhancement in the upper-outer left areola, a 1.3cm enhancement 3-88mm from the lumpectomy site. Biopsy showed hyalinized intraductal papilloma, usual ductal hyperplasia, and no evidence of malignancy.     CHIEF COMPLIANT: Follow-up of left breast DCIS s/p biopsy  INTERVAL HISTORY: Kerri Shah is a 59 y.o. with above-mentioned history of left breast DCIS treated with lumpectomy, radiation, and who is currently on anti-estrogen therapy with tamoxifen. Breast MRI on 01/19/19 showed thickening and focal enhancement in the upper-outer left areola, a 1.3cm enhancement 3-45mm from the lumpectomy site, and no evidence of right breast malignancy. Biopsy on 02/08/19 showed hyalinized intraductal papilloma, usual ductal hyperplasia, and no evidence of malignancy. She also has a history of anemia. She presents to the clinic today to review the pathology.    ALLERGIES:  has No Known Allergies.  MEDICATIONS:  Current  Outpatient Medications  Medication Sig Dispense Refill  . metFORMIN (GLUCOPHAGE-XR) 500 MG 24 hr tablet Take 500 mg by mouth at bedtime.   1  . Multiple Vitamins-Minerals (MULTIVITAMIN WITH MINERALS) tablet Take 1 tablet by mouth daily.    . pravastatin (PRAVACHOL) 40 MG tablet Take 40 mg by mouth at bedtime.   3  . tamoxifen (NOLVADEX) 20 MG tablet Take 1 tablet (20 mg total) by mouth at bedtime. 90 tablet 3   No current facility-administered medications for this visit.    PHYSICAL EXAMINATION: ECOG PERFORMANCE STATUS: 1 - Symptomatic but completely ambulatory  There were no vitals filed for this visit. There were no vitals filed for this visit.  LABORATORY DATA:  I have reviewed the data as listed CMP Latest Ref Rng & Units 03/21/2018 12/29/2017 06/25/2017  Glucose 70 - 99 mg/dL 125(H) 118(H) 128(H)  BUN 6 - 20 mg/dL 17 16 20   Creatinine 0.44 - 1.00 mg/dL 1.18(H) 1.43(H) 0.84  Sodium 135 - 145 mmol/L 142 142 137  Potassium 3.5 - 5.1 mmol/L 3.1(L) 3.7 3.8  Chloride 98 - 111 mmol/L 101 105 102  CO2 22 - 32 mmol/L 29 28 27   Calcium 8.9 - 10.3 mg/dL 9.8 9.8 9.2  Total Protein 6.5 - 8.1 g/dL 7.5 7.3 7.3  Total Bilirubin 0.3 - 1.2 mg/dL 0.9 0.3 0.6  Alkaline Phos 38 - 126 U/L 45 38 34(L)  AST 15 - 41 U/L 21 17 24   ALT 0 - 44 U/L 21 15 25     Lab Results  Component Value Date   WBC 3.2 (L) 12/26/2018   HGB 10.8 (L) 12/26/2018   HCT 34.7 (L) 12/26/2018  MCV 73.7 (L) 12/26/2018   PLT 264 12/26/2018   NEUTROABS 1.9 12/26/2018    ASSESSMENT & PLAN:  Ductal carcinoma in situ (DCIS) of left breast Left breast [name] biopsy 12/31/2015: 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 Breast conserving surgery: 01/30/16: ADH 2. Followed by adjuvant radiation therapy 3. Followed by antiestrogen therapy with tamoxifen 5 yearsstarted 04/28/2016  Tamoxifen toxicities: 1.Occasional hot flashes 2.Myalgias  Breast cancer surveillance: 1.Breast exam  01/03/2019: Benign 2.mammogram 12/12/2018:Benignbreast density category C 3.  Breast MRI 08/03/2018: Done to evaluate bloody nipple discharge: Left breast seroma, 4 mm possibly benign enhancement left areola. Repeat breast MRI 02/06/2019: 1.3 cm area of enhancement medial aspect of left nipple areolar complex, stable postoperative seroma 5.6 cm Biopsy 02/08/2019: Hyalinized intraductal papilloma, UDH Patient has an appointment to see Dr. Barry Dienes who will make the final decision.  Return to clinic in 1 year for follow-up  No orders of the defined types were placed in this encounter.  The patient has a good understanding of the overall plan. she agrees with it. she will call with any problems that may develop before the next visit here.  Total time spent: 30 mins including face to face time and time spent for planning, charting and coordination of care  Nicholas Lose, MD 02/14/2019  I, Cloyde Reams Dorshimer, am acting as scribe for Dr. Nicholas Lose.  I have reviewed the above document for accuracy and completeness, and I agree with the above.

## 2019-02-14 ENCOUNTER — Inpatient Hospital Stay: Payer: 59 | Attending: Hematology and Oncology | Admitting: Hematology and Oncology

## 2019-02-14 ENCOUNTER — Other Ambulatory Visit: Payer: Self-pay

## 2019-02-14 DIAGNOSIS — Z7984 Long term (current) use of oral hypoglycemic drugs: Secondary | ICD-10-CM | POA: Diagnosis not present

## 2019-02-14 DIAGNOSIS — N951 Menopausal and female climacteric states: Secondary | ICD-10-CM | POA: Diagnosis not present

## 2019-02-14 DIAGNOSIS — Z170 Estrogen receptor positive status [ER+]: Secondary | ICD-10-CM | POA: Diagnosis not present

## 2019-02-14 DIAGNOSIS — R232 Flushing: Secondary | ICD-10-CM | POA: Diagnosis not present

## 2019-02-14 DIAGNOSIS — D0512 Intraductal carcinoma in situ of left breast: Secondary | ICD-10-CM | POA: Diagnosis present

## 2019-02-14 DIAGNOSIS — Z923 Personal history of irradiation: Secondary | ICD-10-CM | POA: Insufficient documentation

## 2019-02-14 DIAGNOSIS — Z79899 Other long term (current) drug therapy: Secondary | ICD-10-CM | POA: Diagnosis not present

## 2019-02-14 DIAGNOSIS — Z79810 Long term (current) use of selective estrogen receptor modulators (SERMs): Secondary | ICD-10-CM | POA: Insufficient documentation

## 2019-02-14 NOTE — Assessment & Plan Note (Signed)
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 Breast conserving surgery: 01/30/16: ADH 2. Followed by adjuvant radiation therapy 3. Followed by antiestrogen therapy with tamoxifen 5 yearsstarted 04/28/2016  Tamoxifen toxicities: 1.Occasional hot flashes 2.Myalgias  Breast cancer surveillance: 1.Breast exam 01/03/2019: Benign 2.mammogram 12/12/2018:Benignbreast density category C 3.  Breast MRI 08/03/2018: Done to evaluate bloody nipple discharge: Left breast seroma, 4 mm possibly benign enhancement left areola. Repeat breast MRI 02/06/2019: 1.3 cm area of enhancement medial aspect of left nipple areolar complex, stable postoperative seroma 5.6 cm Biopsy 02/08/2019: Hyalinized intraductal papilloma, UDH  Return to clinic in 1 year for follow-up

## 2019-02-17 ENCOUNTER — Other Ambulatory Visit: Payer: Self-pay | Admitting: General Surgery

## 2019-02-17 DIAGNOSIS — R928 Other abnormal and inconclusive findings on diagnostic imaging of breast: Secondary | ICD-10-CM

## 2019-02-21 ENCOUNTER — Other Ambulatory Visit: Payer: Self-pay | Admitting: General Surgery

## 2019-02-21 DIAGNOSIS — R928 Other abnormal and inconclusive findings on diagnostic imaging of breast: Secondary | ICD-10-CM

## 2019-02-22 ENCOUNTER — Encounter (HOSPITAL_BASED_OUTPATIENT_CLINIC_OR_DEPARTMENT_OTHER): Payer: Self-pay | Admitting: General Surgery

## 2019-02-22 ENCOUNTER — Other Ambulatory Visit: Payer: Self-pay

## 2019-02-23 ENCOUNTER — Encounter (HOSPITAL_BASED_OUTPATIENT_CLINIC_OR_DEPARTMENT_OTHER)
Admission: RE | Admit: 2019-02-23 | Discharge: 2019-02-23 | Disposition: A | Discharge status: Home / Self Care | Payer: 59 | Source: Ambulatory Visit | Attending: General Surgery | Admitting: General Surgery

## 2019-02-23 DIAGNOSIS — Z01818 Encounter for other preprocedural examination: Secondary | ICD-10-CM | POA: Insufficient documentation

## 2019-02-23 DIAGNOSIS — E119 Type 2 diabetes mellitus without complications: Secondary | ICD-10-CM | POA: Insufficient documentation

## 2019-02-23 LAB — BASIC METABOLIC PANEL
Anion gap: 10 (ref 5–15)
BUN: 17 mg/dL (ref 6–20)
CO2: 27 mmol/L (ref 22–32)
Calcium: 9.5 mg/dL (ref 8.9–10.3)
Chloride: 105 mmol/L (ref 98–111)
Creatinine, Ser: 1.07 mg/dL — ABNORMAL HIGH (ref 0.44–1.00)
GFR calc Af Amer: >60 mL/min (ref >60)
GFR calc non Af Amer: 56 mL/min — ABNORMAL LOW (ref >60)
Glucose, Bld: 138 mg/dL — ABNORMAL HIGH (ref 70–99)
Potassium: 4.1 mmol/L (ref 3.5–5.1)
Sodium: 142 mmol/L (ref 135–145)

## 2019-02-23 MED ORDER — ENSURE PRE-SURGERY PO LIQD: 296.0000 mL | Freq: Once | ORAL | Status: DC

## 2019-02-23 NOTE — Progress Notes (Signed)

## 2019-02-25 ENCOUNTER — Other Ambulatory Visit (HOSPITAL_COMMUNITY)
Admission: RE | Admit: 2019-02-25 | Discharge: 2019-02-25 | Disposition: A | Discharge status: Home / Self Care | Payer: 59 | Source: Ambulatory Visit | Attending: General Surgery | Admitting: General Surgery

## 2019-02-25 DIAGNOSIS — Z20822 Contact with and (suspected) exposure to covid-19: Secondary | ICD-10-CM | POA: Insufficient documentation

## 2019-02-25 DIAGNOSIS — Z01812 Encounter for preprocedural laboratory examination: Secondary | ICD-10-CM | POA: Insufficient documentation

## 2019-02-25 LAB — SARS CORONAVIRUS 2 (TAT 6-24 HRS): SARS Coronavirus 2: NEGATIVE

## 2019-02-28 ENCOUNTER — Ambulatory Visit
Admission: RE | Admit: 2019-02-28 | Discharge: 2019-02-28 | Disposition: A | Discharge status: Home / Self Care | Payer: 59 | Source: Ambulatory Visit | Attending: General Surgery | Admitting: General Surgery

## 2019-02-28 ENCOUNTER — Other Ambulatory Visit: Payer: Self-pay

## 2019-02-28 DIAGNOSIS — R928 Other abnormal and inconclusive findings on diagnostic imaging of breast: Secondary | ICD-10-CM

## 2019-02-28 NOTE — H&P (Signed)
Kerri Shah Documented: 02/17/2019 11:29 AM Location: [facility] Patient #: [id number] DOB: 04-Mar-1960 Married / Language: English / Race: Black or African American Female   History of Present Illness Kerri Shah; 02/17/2019 12:13 PM) The patient is a 59 year old female who presents for a follow-up for Breast cancer. Pt is a 59 yo F dx with left breast cancer 12/2015 with screening detected calcifications. Diagnostic imaging showed a group of heterogeneous calcifications with linear forms and linear orientation at 12:30. This was 1 cm. 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 She has a family history of breast cancer in 2 maternal aunts. She is status post hysterectomy for fibroids.  Pt is s/p seed localized lumpectomy 01/30/16. She had no residual cancer (DCIS). She had fibrocystic changes with usual ductal hyperplasia. She received post op adjuvant XRT in [place]. She started adjuvant tamoxifen 20 mg/day 04/2016. She had a post op seroma and left breast lymphedema. She saw PT and got a flexitouch. Since I saw her in December 2018, her nipple discharge had resolved. She has been doing lymph flow therapy 1-2 times per week and periodically does the flexitouch.   She had her gallbladder out 2019. She also has had some anemia recur this summer. She may need to get repeat EGD. She just had colonoscopy last year.   She had a spot on the lower left breast that she was concerned at one point . I felt that it was likely just a prominent inframammary fold. She had her dx mammogram which was reassuring. She  She presented to urgent office 5//2020 with concerns of bloody nipple discharge that she noticed about 5 days ago. She not had any discharge since then. Denied other symptoms such as breast pain, redness, swelling, or new lumps. Denied fevers and issues with her incisions. Her last mammogram was in  October 2019.   She had no concerning masses on dx imaging, however, she did have a complex seroma present. MRI was recommended. Nipple discharge resolved.   She had MR in June which was birads 3. 6 month follow up was recommended which happened in December 2020. She then had a follow up MR guided biopsy that showed papilloma. Excision was recommended. She is doing fine other than occasional pains in her left breast. She continues on her tamoxifen.   MR 02/06/19 IMPRESSION: 1. Thickening and focal enhancement centered in the upper-outer left areola and possibly within the immediate subareolar tissue. Malignancy cannot be excluded. 2. 1.3 x 1.0 cm area of clumped enhancement 3-5 mm inferior to the lumpectomy site in the upper central/outer left breast. Ductal carcinoma in situ cannot be excluded. 3. No MRI evidence of malignancy in the right breast.  Dx mammogram 12/2018 CLINICAL DATA: 59 year old patient presents for routine annual examination. She has a history of left breast cancer status post lumpectomy in 2017. She presented with bloody left nipple discharge in June of 2020 breast MRI was recommended and performed. A six-month follow-up left breast MRI was suggested.The patient reports today that she is no longer having bloody nipple discharge.  EXAM: DIGITAL DIAGNOSTIC BILATERAL MAMMOGRAM WITH CAD AND TOMO  COMPARISON: Breast MRI August 03, 2018 and prior mammograms  ACR Breast Density Category b: There are scattered areas of fibroglandular density.  FINDINGS: Postoperative seroma in the outer left breast is stable. Surgical clips are present in this region. No suspicious mass, architectural distortion, or suspicious microcalcification is  identified in either breast to suggest malignancy.  Mammographic images were processed with CAD.  IMPRESSION: No mammographic evidence of malignancy in either breast.  RECOMMENDATION: A follow-up bilateral breast MRI was  recommended for December 2020. This was discussed with the patient today.  Diagnostic mammogram is suggested in 1 year. (Code:[id number])  I have discussed the findings and recommendations with the patient. If applicable, a reminder letter will be sent to the patient regarding the next appointment.  BI-RADS CATEGORY 2: Benign.  pathology from MR biopsy 02/08/2019 Diagnosis Breast, left, needle core biopsy, inferior central - HYALINIZED INTRADUCTAL PAPILLOMA. - USUAL DUCTAL HYPERPLASIA. - NO MALIGNANCY IDENTIFIED.   Allergies Kerri Shah, Kerri Shah; 02/17/2019 11:30 AM) No Known Drug Allergies [01/13/2016]: Allergies Reconciled   Medication History Kerri Shah, CMA; 02/17/2019 11:30 AM) Omeprazole (20MG  Capsule DR, Oral) Active. Colace (100MG  Capsule, Oral) Active. Geritol (Oral) Active. Tamoxifen Citrate (20MG  Tablet, Oral) Active. Multi-Minerals (Oral daily) Active. MetFORMIN HCl (500MG  Tablet, Oral daily) Active. Pravastatin Sodium (40MG  Tablet, Oral daily) Active. Medications Reconciled    Review of Systems Kerri Shah; 02/17/2019 12:13 PM) All other systems negative  Vitals Kerri Shah CMA; 02/17/2019 11:30 AM) 02/17/2019 11:29 AM Weight: 171.8 lb Height: 66in Body Surface Area: 1.88 m Body Mass Index: 27.73 kg/m  Temp.: 97.19F  Pulse: 120 (Regular)  BP: 140/88 (Sitting, Left Arm, Standard)       Physical Exam Kerri Shah; 02/17/2019 12:14 PM) General Mental Status-Alert. General Appearance-Consistent with stated age. Hydration-Well hydrated. Voice-Normal.  Head and Neck Head-normocephalic, atraumatic with no lesions or palpable masses.  Eye Sclera/Conjunctiva - Bilateral-No scleral icterus.  Chest and Lung Exam Chest and lung exam reveals -quiet, even and easy respiratory effort with no use of accessory muscles. Inspection Chest Wall - Normal. Back - normal.  Breast Note: left breast with  anticipated lumpectomy thickening upper outer quadrant. No discharge appreciated. Thickening of skin has resolved. no palpable masses. no scaly skin seen on nipple or areola.   Cardiovascular Cardiovascular examination reveals -normal pedal pulses bilaterally. Note: regular rate and rhythm  Abdomen Inspection-Inspection Normal. Palpation/Percussion Palpation and Percussion of the abdomen reveal - Soft, Non Tender, No Rebound tenderness, No Rigidity (guarding) and No hepatosplenomegaly.  Peripheral Vascular Upper Extremity Inspection - Bilateral - Normal - No Clubbing, No Cyanosis, No Edema, Pulses Intact. Lower Extremity Palpation - Edema - Bilateral - No edema - Bilateral.  Neurologic Neurologic evaluation reveals -alert and oriented x 3 with no impairment of recent or remote memory. Mental Status-Normal.  Musculoskeletal Global Assessment -Note: no gross deformities.  Normal Exam - Left-Upper Extremity Strength Normal and Lower Extremity Strength Normal. Normal Exam - Right-Upper Extremity Strength Normal and Lower Extremity Strength Normal.  Lymphatic Head & Neck  General Head & Neck Lymphatics: Bilateral - Description - Normal. Axillary  General Axillary Region: Bilateral - Description - Normal. Tenderness - Non Tender.    Assessment & Plan Kerri Shah; 02/17/2019 12:10 PM) ABNORMAL MAGNETIC RESONANCE IMAGING OF LEFT BREAST (R92.8) Impression: Core needle biopsy was papilloma. Will plan excision. Will do this as a seed localized excisional biopsy. Discussed surgery and risks. Can also drain chronic seroma.  Will schedule as outpatient as priority 2.  22 min spent in evaluation, examination, counseling, and coordination of care. >50% spent in counseling. HISTORY OF LEFT BREAST CANCER (Z85.3) Impression: Makes her slightly higher risk for having DCIS.  Continue tamoxifen.  Continue surveillance. Current Plans You are being scheduled for  surgery- Our schedulers will call you.  You  should hear from our office's scheduling department within 5 working days about the location, date, and time of surgery. We try to make accommodations for patient's preferences in scheduling surgery, but sometimes the OR schedule or the surgeon's schedule prevents [place] from making those accommodations.  If you have not heard from our office [phone]) in 5 working days, call the office and ask for your surgeon's nurse.  If you have other questions about your diagnosis, plan, or surgery, call the office and ask for your surgeon's nurse.  Pt Education - [facility] Breast Biopsy HCI: discussed with patient and provided information.   Signed electronically by Kerri Klein, Shah (02/17/2019 12:15 PM)

## 2019-03-01 ENCOUNTER — Ambulatory Visit
Admission: RE | Admit: 2019-03-01 | Discharge: 2019-03-01 | Disposition: A | Discharge status: Home / Self Care | Payer: 59 | Source: Ambulatory Visit | Attending: General Surgery | Admitting: General Surgery

## 2019-03-01 ENCOUNTER — Encounter (HOSPITAL_BASED_OUTPATIENT_CLINIC_OR_DEPARTMENT_OTHER)
Admission: RE | Disposition: A | Discharge status: Home / Self Care | Payer: Self-pay | Source: Home / Self Care | Attending: General Surgery

## 2019-03-01 ENCOUNTER — Ambulatory Visit (HOSPITAL_BASED_OUTPATIENT_CLINIC_OR_DEPARTMENT_OTHER): Payer: 59 | Admitting: Anesthesiology

## 2019-03-01 ENCOUNTER — Other Ambulatory Visit: Payer: Self-pay

## 2019-03-01 ENCOUNTER — Ambulatory Visit (HOSPITAL_BASED_OUTPATIENT_CLINIC_OR_DEPARTMENT_OTHER)
Admission: RE | Admit: 2019-03-01 | Discharge: 2019-03-01 | Disposition: A | Discharge status: Home / Self Care | Payer: 59 | Attending: General Surgery | Admitting: General Surgery

## 2019-03-01 ENCOUNTER — Encounter (HOSPITAL_BASED_OUTPATIENT_CLINIC_OR_DEPARTMENT_OTHER): Payer: Self-pay | Admitting: General Surgery

## 2019-03-01 DIAGNOSIS — Z9049 Acquired absence of other specified parts of digestive tract: Secondary | ICD-10-CM | POA: Insufficient documentation

## 2019-03-01 DIAGNOSIS — Z853 Personal history of malignant neoplasm of breast: Secondary | ICD-10-CM | POA: Insufficient documentation

## 2019-03-01 DIAGNOSIS — Z79899 Other long term (current) drug therapy: Secondary | ICD-10-CM | POA: Diagnosis not present

## 2019-03-01 DIAGNOSIS — D242 Benign neoplasm of left breast: Secondary | ICD-10-CM | POA: Insufficient documentation

## 2019-03-01 DIAGNOSIS — E119 Type 2 diabetes mellitus without complications: Secondary | ICD-10-CM | POA: Insufficient documentation

## 2019-03-01 DIAGNOSIS — Z7984 Long term (current) use of oral hypoglycemic drugs: Secondary | ICD-10-CM | POA: Insufficient documentation

## 2019-03-01 DIAGNOSIS — Z923 Personal history of irradiation: Secondary | ICD-10-CM | POA: Insufficient documentation

## 2019-03-01 DIAGNOSIS — R928 Other abnormal and inconclusive findings on diagnostic imaging of breast: Secondary | ICD-10-CM

## 2019-03-01 DIAGNOSIS — C50912 Malignant neoplasm of unspecified site of left female breast: Secondary | ICD-10-CM | POA: Diagnosis present

## 2019-03-01 HISTORY — PX: BREAST LUMPECTOMY: SHX2

## 2019-03-01 HISTORY — PX: RADIOACTIVE SEED GUIDED EXCISIONAL BREAST BIOPSY: SHX6490

## 2019-03-01 LAB — GLUCOSE, CAPILLARY
Glucose-Capillary: 103 mg/dL — ABNORMAL HIGH (ref 70–99)
Glucose-Capillary: 94 mg/dL (ref 70–99)
Glucose-Capillary: 131 mg/dL — ABNORMAL HIGH (ref 70–99)

## 2019-03-01 SURGERY — RADIOACTIVE SEED GUIDED BREAST BIOPSY
Anesthesia: General | Site: Breast | Laterality: Left

## 2019-03-01 MED ORDER — ONDANSETRON 4 MG PO TBDP
ORAL_TABLET | ORAL | Status: AC
  Filled 2019-03-01: qty 1

## 2019-03-01 MED ORDER — GABAPENTIN 300 MG PO CAPS
ORAL_CAPSULE | ORAL | Status: AC
  Filled 2019-03-01: qty 1

## 2019-03-01 MED ORDER — OXYCODONE HCL 5 MG PO TABS: 5.0000 mg | ORAL_TABLET | Freq: Once | ORAL | Status: DC | PRN

## 2019-03-01 MED ORDER — FENTANYL CITRATE (PF) 100 MCG/2ML IJ SOLN
INTRAMUSCULAR | Status: AC
  Filled 2019-03-01: qty 2

## 2019-03-01 MED ORDER — LACTATED RINGERS IV SOLN: INTRAVENOUS | Status: DC

## 2019-03-01 MED ORDER — ACETAMINOPHEN 325 MG PO TABS: 325.0000 mg | ORAL_TABLET | ORAL | Status: DC | PRN

## 2019-03-01 MED ORDER — DEXAMETHASONE SODIUM PHOSPHATE 10 MG/ML IJ SOLN
INTRAMUSCULAR | Status: DC | PRN
  Administered 2019-03-01: 5 mg via INTRAVENOUS

## 2019-03-01 MED ORDER — FENTANYL CITRATE (PF) 100 MCG/2ML IJ SOLN: 50.0000 ug | INTRAMUSCULAR | Status: DC | PRN

## 2019-03-01 MED ORDER — ACETAMINOPHEN 500 MG PO TABS
ORAL_TABLET | ORAL | Status: AC
  Filled 2019-03-01: qty 2

## 2019-03-01 MED ORDER — OXYCODONE HCL 5 MG/5ML PO SOLN: 5.0000 mg | Freq: Once | ORAL | Status: DC | PRN

## 2019-03-01 MED ORDER — CHLORHEXIDINE GLUCONATE CLOTH 2 % EX PADS: 6.0000 | MEDICATED_PAD | Freq: Once | CUTANEOUS | Status: DC

## 2019-03-01 MED ORDER — CEFAZOLIN SODIUM-DEXTROSE 2-4 GM/100ML-% IV SOLN
INTRAVENOUS | Status: AC
  Filled 2019-03-01: qty 100

## 2019-03-01 MED ORDER — BUPIVACAINE HCL (PF) 0.5 % IJ SOLN
INTRAMUSCULAR | Status: AC
  Filled 2019-03-01: qty 30

## 2019-03-01 MED ORDER — MEPERIDINE HCL 25 MG/ML IJ SOLN: 6.2500 mg | INTRAMUSCULAR | Status: DC | PRN

## 2019-03-01 MED ORDER — CEFAZOLIN SODIUM-DEXTROSE 2-3 GM-%(50ML) IV SOLR
INTRAVENOUS | Status: DC | PRN
  Administered 2019-03-01: 2 g via INTRAVENOUS

## 2019-03-01 MED ORDER — ACETAMINOPHEN 500 MG PO TABS
1000.0000 mg | ORAL_TABLET | ORAL | Status: AC
  Administered 2019-03-01: 1000 mg via ORAL

## 2019-03-01 MED ORDER — FENTANYL CITRATE (PF) 100 MCG/2ML IJ SOLN
INTRAMUSCULAR | Status: DC | PRN
  Administered 2019-03-01: 25 ug via INTRAVENOUS
  Administered 2019-03-01 (×2): 50 ug via INTRAVENOUS

## 2019-03-01 MED ORDER — LIDOCAINE 2% (20 MG/ML) 5 ML SYRINGE
INTRAMUSCULAR | Status: AC
  Filled 2019-03-01: qty 5

## 2019-03-01 MED ORDER — CEFAZOLIN SODIUM-DEXTROSE 2-4 GM/100ML-% IV SOLN: 2.0000 g | INTRAVENOUS | Status: DC

## 2019-03-01 MED ORDER — ACETAMINOPHEN 160 MG/5ML PO SOLN: 325.0000 mg | ORAL | Status: DC | PRN

## 2019-03-01 MED ORDER — OXYCODONE HCL 5 MG PO TABS: 5.0000 mg | ORAL_TABLET | ORAL | 0 refills | Status: DC | PRN

## 2019-03-01 MED ORDER — FENTANYL CITRATE (PF) 100 MCG/2ML IJ SOLN: 25.0000 ug | INTRAMUSCULAR | Status: DC | PRN

## 2019-03-01 MED ORDER — MIDAZOLAM HCL 2 MG/2ML IJ SOLN
1.0000 mg | INTRAMUSCULAR | Status: DC | PRN
  Administered 2019-03-01: 2 mg via INTRAVENOUS

## 2019-03-01 MED ORDER — LIDOCAINE HCL (PF) 1 % IJ SOLN
INTRAMUSCULAR | Status: DC | PRN
  Administered 2019-03-01: 30 mL

## 2019-03-01 MED ORDER — SCOPOLAMINE 1 MG/3DAYS TD PT72
1.0000 | MEDICATED_PATCH | TRANSDERMAL | Status: DC
  Administered 2019-03-01: 12:00:00 1.5 mg via TRANSDERMAL

## 2019-03-01 MED ORDER — LIDOCAINE HCL (PF) 1 % IJ SOLN
INTRAMUSCULAR | Status: AC
  Filled 2019-03-01: qty 30

## 2019-03-01 MED ORDER — ONDANSETRON 4 MG PO TBDP
4.0000 mg | ORAL_TABLET | Freq: Once | ORAL | Status: AC
  Administered 2019-03-01: 4 mg via ORAL

## 2019-03-01 MED ORDER — SCOPOLAMINE 1 MG/3DAYS TD PT72
MEDICATED_PATCH | TRANSDERMAL | Status: AC
  Filled 2019-03-01: qty 1

## 2019-03-01 MED ORDER — BUPIVACAINE HCL (PF) 0.5 % IJ SOLN
INTRAMUSCULAR | Status: DC | PRN
  Administered 2019-03-01: 30 mL

## 2019-03-01 MED ORDER — LIDOCAINE HCL (CARDIAC) PF 100 MG/5ML IV SOSY
PREFILLED_SYRINGE | INTRAVENOUS | Status: DC | PRN
  Administered 2019-03-01: 50 mg via INTRAVENOUS

## 2019-03-01 MED ORDER — BUPIVACAINE HCL (PF) 0.25 % IJ SOLN
INTRAMUSCULAR | Status: AC
  Filled 2019-03-01: qty 30

## 2019-03-01 MED ORDER — DEXAMETHASONE SODIUM PHOSPHATE 10 MG/ML IJ SOLN
INTRAMUSCULAR | Status: AC
  Filled 2019-03-01: qty 1

## 2019-03-01 MED ORDER — ONDANSETRON HCL 4 MG/2ML IJ SOLN
INTRAMUSCULAR | Status: DC | PRN
  Administered 2019-03-01: 4 mg via INTRAVENOUS

## 2019-03-01 MED ORDER — KETOROLAC TROMETHAMINE 30 MG/ML IJ SOLN
INTRAMUSCULAR | Status: DC | PRN
  Administered 2019-03-01: 15 mg via INTRAVENOUS

## 2019-03-01 MED ORDER — PROPOFOL 10 MG/ML IV BOLUS
INTRAVENOUS | Status: DC | PRN
  Administered 2019-03-01: 200 mg via INTRAVENOUS

## 2019-03-01 MED ORDER — ONDANSETRON HCL 4 MG/2ML IJ SOLN: 4.0000 mg | Freq: Once | INTRAMUSCULAR | Status: DC | PRN

## 2019-03-01 MED ORDER — KETOROLAC TROMETHAMINE 15 MG/ML IJ SOLN: 15.0000 mg | Freq: Once | INTRAMUSCULAR | Status: DC

## 2019-03-01 MED ORDER — KETOROLAC TROMETHAMINE 30 MG/ML IJ SOLN
INTRAMUSCULAR | Status: AC
  Filled 2019-03-01: qty 1

## 2019-03-01 MED ORDER — PROPOFOL 10 MG/ML IV BOLUS
INTRAVENOUS | Status: AC
  Filled 2019-03-01: qty 20

## 2019-03-01 MED ORDER — ONDANSETRON HCL 4 MG/2ML IJ SOLN
INTRAMUSCULAR | Status: AC
  Filled 2019-03-01: qty 2

## 2019-03-01 MED ORDER — GABAPENTIN 300 MG PO CAPS
300.0000 mg | ORAL_CAPSULE | ORAL | Status: AC
  Administered 2019-03-01: 300 mg via ORAL

## 2019-03-01 MED ORDER — LACTATED RINGERS IV SOLN: INTRAVENOUS | Status: DC | PRN

## 2019-03-01 MED ORDER — MIDAZOLAM HCL 2 MG/2ML IJ SOLN
INTRAMUSCULAR | Status: AC
  Filled 2019-03-01: qty 2

## 2019-03-01 MED ORDER — BUPIVACAINE-EPINEPHRINE 0.5% -1:200000 IJ SOLN: INTRAMUSCULAR | Status: DC | PRN

## 2019-03-01 SURGICAL SUPPLY — 56 items
BINDER BREAST XXLRG (GAUZE/BANDAGES/DRESSINGS) ×2 IMPLANT
BLADE SURG 10 STRL SS (BLADE) ×3 IMPLANT
BLADE SURG 15 STRL LF DISP TIS (BLADE) IMPLANT
BLADE SURG 15 STRL SS (BLADE) ×2
CANISTER SUC SOCK COL 7IN (MISCELLANEOUS) IMPLANT
CANISTER SUCT 1200ML W/VALVE (MISCELLANEOUS) ×3 IMPLANT
CHLORAPREP W/TINT 26 (MISCELLANEOUS) ×3 IMPLANT
CLIP VESOCCLUDE LG 6/CT (CLIP) ×3 IMPLANT
CLOSURE WOUND 1/2 X4 (GAUZE/BANDAGES/DRESSINGS) ×1
COVER BACK TABLE 60X90IN (DRAPES) ×3 IMPLANT
COVER MAYO STAND STRL (DRAPES) ×3 IMPLANT
COVER PROBE W GEL 5X96 (DRAPES) ×3 IMPLANT
COVER WAND RF STERILE (DRAPES) IMPLANT
DECANTER SPIKE VIAL GLASS SM (MISCELLANEOUS) ×2 IMPLANT
DERMABOND ADVANCED (GAUZE/BANDAGES/DRESSINGS) ×2
DERMABOND ADVANCED .7 DNX12 (GAUZE/BANDAGES/DRESSINGS) ×1 IMPLANT
DRAPE LAPAROSCOPIC ABDOMINAL (DRAPES) ×3 IMPLANT
DRAPE UTILITY XL STRL (DRAPES) ×3 IMPLANT
DRSG PAD ABDOMINAL 8X10 ST (GAUZE/BANDAGES/DRESSINGS) ×2 IMPLANT
ELECT COATED BLADE 2.86 ST (ELECTRODE) ×3 IMPLANT
ELECT REM PT RETURN 9FT ADLT (ELECTROSURGICAL) ×3
ELECTRODE REM PT RTRN 9FT ADLT (ELECTROSURGICAL) ×1 IMPLANT
GAUZE SPONGE 4X4 12PLY STRL (GAUZE/BANDAGES/DRESSINGS) ×2 IMPLANT
GAUZE SPONGE 4X4 12PLY STRL LF (GAUZE/BANDAGES/DRESSINGS) ×3 IMPLANT
GLOVE BIO SURGEON STRL SZ 6 (GLOVE) ×3 IMPLANT
GLOVE BIOGEL PI IND STRL 6.5 (GLOVE) ×1 IMPLANT
GLOVE BIOGEL PI INDICATOR 6.5 (GLOVE) ×2
GOWN STRL REUS W/ TWL LRG LVL3 (GOWN DISPOSABLE) ×1 IMPLANT
GOWN STRL REUS W/TWL 2XL LVL3 (GOWN DISPOSABLE) ×3 IMPLANT
GOWN STRL REUS W/TWL LRG LVL3 (GOWN DISPOSABLE) ×2
KIT MARKER MARGIN INK (KITS) ×3 IMPLANT
LIGHT WAVEGUIDE WIDE FLAT (MISCELLANEOUS) ×2 IMPLANT
NDL HYPO 25X1 1.5 SAFETY (NEEDLE) ×1 IMPLANT
NEEDLE HYPO 25X1 1.5 SAFETY (NEEDLE) ×3 IMPLANT
NS IRRIG 1000ML POUR BTL (IV SOLUTION) ×3 IMPLANT
PACK BASIN DAY SURGERY FS (CUSTOM PROCEDURE TRAY) ×3 IMPLANT
PENCIL SMOKE EVACUATOR (MISCELLANEOUS) ×3 IMPLANT
SLEEVE SCD COMPRESS KNEE MED (MISCELLANEOUS) ×3 IMPLANT
SPONGE LAP 18X18 RF (DISPOSABLE) ×3 IMPLANT
STAPLER VISISTAT 35W (STAPLE) ×2 IMPLANT
STRIP CLOSURE SKIN 1/2X4 (GAUZE/BANDAGES/DRESSINGS) ×2 IMPLANT
SUT MON AB 4-0 PC3 18 (SUTURE) ×3 IMPLANT
SUT SILK 2 0 SH (SUTURE) IMPLANT
SUT VIC AB 2-0 SH 18 (SUTURE) IMPLANT
SUT VIC AB 2-0 SH 27 (SUTURE) ×4
SUT VIC AB 2-0 SH 27XBRD (SUTURE) IMPLANT
SUT VIC AB 3-0 SH 27 (SUTURE) ×2
SUT VIC AB 3-0 SH 27X BRD (SUTURE) ×1 IMPLANT
SUT VICRYL 3-0 CR8 SH (SUTURE) IMPLANT
SYR BULB 3OZ (MISCELLANEOUS) ×3 IMPLANT
SYR CONTROL 10ML LL (SYRINGE) ×3 IMPLANT
TOWEL GREEN STERILE FF (TOWEL DISPOSABLE) ×3 IMPLANT
TRAY FAXITRON CT DISP (TRAY / TRAY PROCEDURE) ×3 IMPLANT
TUBE CONNECTING 20'X1/4 (TUBING) ×1
TUBE CONNECTING 20X1/4 (TUBING) ×2 IMPLANT
YANKAUER SUCT BULB TIP NO VENT (SUCTIONS) ×3 IMPLANT

## 2019-03-01 NOTE — Transfer of Care (Signed)
Immediate Anesthesia Transfer of Care Note  Patient: Kerri Shah  Procedure(s) Performed: RADIOACTIVE SEED GUIDED EXCISIONAL LEFT BREAST BIOPSY (Left Breast)  Patient Location: PACU  Anesthesia Type:General  Level of Consciousness: drowsy and patient cooperative  Airway & Oxygen Therapy: Patient Spontanous Breathing and Patient connected to face mask oxygen  Post-op Assessment: Report given to RN and Post -op Vital signs reviewed and stable  Post vital signs: Reviewed and stable  Last Vitals:  Vitals Value Taken Time  BP 139/89 03/01/19 1318  Temp    Pulse 77 03/01/19 1319  Resp 7 03/01/19 1319  SpO2 100 % 03/01/19 1319  Vitals shown include unvalidated device data.  Last Pain:  Vitals:   03/01/19 1045  TempSrc: Temporal  PainSc: 0-No pain         Complications: No apparent anesthesia complications

## 2019-03-01 NOTE — Anesthesia Procedure Notes (Signed)
Procedure Name: LMA Insertion Performed by: Brinda Focht M, CRNA Pre-anesthesia Checklist: Patient identified, Emergency Drugs available, Suction available and Patient being monitored Patient Re-evaluated:Patient Re-evaluated prior to induction Oxygen Delivery Method: Circle system utilized Preoxygenation: Pre-oxygenation with 100% oxygen Induction Type: IV induction Ventilation: Mask ventilation without difficulty LMA: LMA inserted LMA Size: 4.0 Number of attempts: 1 Airway Equipment and Method: Bite block Placement Confirmation: positive ETCO2 Tube secured with: Tape Dental Injury: Teeth and Oropharynx as per pre-operative assessment        

## 2019-03-01 NOTE — Interval H&P Note (Signed)
History and Physical Interval Note:  03/01/2019 11:15 AM  Kerri Shah  has presented today for surgery, with the diagnosis of LEFT BREAST CANCER, ABRNORMAL BREAST MRI.  The various methods of treatment have been discussed with the patient and family. After consideration of risks, benefits and other options for treatment, the patient has consented to  Procedure(s): RADIOACTIVE SEED GUIDED EXCISIONAL LEFT BREAST BIOPSY (Left) as a surgical intervention.  The patient's history has been reviewed, patient examined, no change in status, stable for surgery.  I have reviewed the patient's chart and labs.  Questions were answered to the patient's satisfaction.     [name]

## 2019-03-01 NOTE — Anesthesia Postprocedure Evaluation (Addendum)
Anesthesia Post Note  Patient: Kerri Shah  Procedure(s) Performed: RADIOACTIVE SEED GUIDED EXCISIONAL LEFT BREAST BIOPSY (Left Breast)     Patient location during evaluation: PACU Anesthesia Type: General Level of consciousness: awake Pain management: pain level controlled Vital Signs Assessment: post-procedure vital signs reviewed and stable Respiratory status: spontaneous breathing Cardiovascular status: stable Postop Assessment: no apparent nausea or vomiting Anesthetic complications: yes Anesthetic complication details: PONV   Last Vitals:  Vitals:   03/01/19 1410 03/01/19 1415  BP:  (!) 187/91  Pulse:  83  Resp:  17  Temp:    SpO2: 100% 100%    Last Pain:  Vitals:   03/01/19 1410  TempSrc:   PainSc: 5    Pain Goal:                   Huston Foley

## 2019-03-01 NOTE — Discharge Instructions (Addendum)
Next Dose of Tylenol at 5 PM Next Dose of Ibuprofen at 6 PM  [facility] Office Phone Number [phone]  BREAST BIOPSY/ PARTIAL MASTECTOMY: POST OP INSTRUCTIONS  Always review your discharge instruction sheet given to you by the facility where your surgery was performed.  IF YOU HAVE DISABILITY OR FAMILY LEAVE FORMS, YOU MUST BRING THEM TO THE OFFICE FOR PROCESSING.  DO NOT GIVE THEM TO YOUR DOCTOR.  1. A prescription for pain medication may be given to you upon discharge.  Take your pain medication as prescribed, if needed.  If narcotic pain medicine is not needed, then you may take acetaminophen (Tylenol) or ibuprofen (Advil) as needed. 2. Take your usually prescribed medications unless otherwise directed 3. If you need a refill on your pain medication, please contact your pharmacy.  They will contact our office to request authorization.  Prescriptions will not be filled after 5pm or on week-ends. 4. You should eat very light the first 24 hours after surgery, such as soup, crackers, pudding, etc.  Resume your normal diet the day after surgery. 5. Most patients will experience some swelling and bruising in the breast.  Ice packs and a good support bra will help.  Swelling and bruising can take several days to resolve.  6. It is common to experience some constipation if taking pain medication after surgery.  Increasing fluid intake and taking a stool softener will usually help or prevent this problem from occurring.  A mild laxative (Milk of Magnesia or Miralax) should be taken according to package directions if there are no bowel movements after 48 hours. 7. Unless discharge instructions indicate otherwise, you may remove your bandages 48 hours after surgery, and you may shower at that time.  You may have steri-strips (small skin tapes) in place directly over the incision.  These strips should be left on the skin for 7-10 days.   Any sutures or staples will be removed at the  office during your follow-up visit. 8. ACTIVITIES:  You may resume regular daily activities (gradually increasing) beginning the next day.  Wearing a good support bra or sports bra (or the breast binder) minimizes pain and swelling.  You may have sexual intercourse when it is comfortable. a. You may drive when you no longer are taking prescription pain medication, you can comfortably wear a seatbelt, and you can safely maneuver your car and apply brakes. b. RETURN TO WORK:  __________1 week_______________ 9. You should see your doctor in the office for a follow-up appointment approximately two weeks after your surgery.  Your doctor's nurse will typically make your follow-up appointment when she calls you with your pathology report.  Expect your pathology report 2-3 business days after your surgery.  You may call to check if you do not hear from [place] after three days.   WHEN TO CALL YOUR DOCTOR: 1. Fever over 101.0 2. Nausea and/or vomiting. 3. Extreme swelling or bruising. 4. Continued bleeding from incision. 5. Increased pain, redness, or drainage from the incision.  The clinic staff is available to answer your questions during regular business hours.  Please don't hesitate to call and ask to speak to one of the nurses for clinical concerns.  If you have a medical emergency, go to the nearest emergency room or call 911.  A surgeon from [facility] is always on call at the hospital.  For further questions, please visit [email or web address]     Post Anesthesia Home Care Instructions  Activity: Get plenty of  rest for the remainder of the day. A responsible individual must stay with you for 24 hours following the procedure.  For the next 24 hours, DO NOT: -Drive a car -Paediatric nurse -Drink alcoholic beverages -Take any medication unless instructed by your physician -Make any legal decisions or sign important papers.  Meals: Start with liquid foods such as gelatin or  soup. Progress to regular foods as tolerated. Avoid greasy, spicy, heavy foods. If nausea and/or vomiting occur, drink only clear liquids until the nausea and/or vomiting subsides. Call your physician if vomiting continues.  Special Instructions/Symptoms: Your throat may feel dry or sore from the anesthesia or the breathing tube placed in your throat during surgery. If this causes discomfort, gargle with warm salt water. The discomfort should disappear within 24 hours.  If you had a scopolamine patch placed behind your ear for the management of post- operative nausea and/or vomiting:  1. The medication in the patch is effective for 72 hours, after which it should be removed.  Wrap patch in a tissue and discard in the trash. Wash hands thoroughly with soap and water. 2. You may remove the patch earlier than 72 hours if you experience unpleasant side effects which may include dry mouth, dizziness or visual disturbances. 3. Avoid touching the patch. Wash your hands with soap and water after contact with the patch.

## 2019-03-01 NOTE — Addendum Note (Signed)
Addendum  created 03/01/19 1517 by Lyn Hollingshead, MD   Clinical Note Signed

## 2019-03-01 NOTE — Anesthesia Preprocedure Evaluation (Signed)
Anesthesia Evaluation  Patient identified by MRN, date of birth, ID band Patient awake    Reviewed: Allergy & Precautions, NPO status , Patient's Chart, lab work & pertinent test results  History of Anesthesia Complications (+) PONV and history of anesthetic complications  Airway Mallampati: I       Dental no notable dental hx. (+) Teeth Intact   Pulmonary neg pulmonary ROS,    Pulmonary exam normal breath sounds clear to auscultation       Cardiovascular Normal cardiovascular exam Rhythm:Regular Rate:Normal     Neuro/Psych negative neurological ROS  negative psych ROS   GI/Hepatic   Endo/Other  diabetes, Oral Hypoglycemic Agents  Renal/GU      Musculoskeletal   Abdominal Normal abdominal exam  (+)   Peds  Hematology   Anesthesia Other Findings   Reproductive/Obstetrics                             Anesthesia Physical Anesthesia Plan  ASA: II  Anesthesia Plan: General   Post-op Pain Management:    Induction: Intravenous  PONV Risk Score and Plan: Ondansetron, Midazolam and Treatment may vary due to age or medical condition  Airway Management Planned: LMA  Additional Equipment: None  Intra-op Plan:   Post-operative Plan: Extubation in OR  Informed Consent: I have reviewed the patients History and Physical, chart, labs and discussed the procedure including the risks, benefits and alternatives for the proposed anesthesia with the patient or authorized representative who has indicated his/her understanding and acceptance.     Dental advisory given  Plan Discussed with: CRNA  Anesthesia Plan Comments:         Anesthesia Quick Evaluation

## 2019-03-01 NOTE — Op Note (Signed)
Left Breast Radioactive seed localized excisional biopsy  Indications: This patient presents with history of abnormal mammogram on left with discordant core needle biopsy (papilloma).  She also has a history of prior breast cancer with lumpectomy, sln bx, and radiation on the same side.    Pre-operative Diagnosis: abnormal left mammogram    Post-operative Diagnosis: abnormal left mammogram  Surgeon: [name]   Anesthesia: General endotracheal anesthesia  ASA Class: 2  Procedure Details  The patient was seen in the Holding Room. The risks, benefits, complications, treatment options, and expected outcomes were discussed with the patient. The possibilities of bleeding, infection, the need for additional procedures, failure to diagnose a condition, and creating a complication requiring transfusion or operation were discussed with the patient. The patient concurred with the proposed plan, giving informed consent.  The site of surgery properly noted/marked. The patient was taken to Operating Room # 1, identified, and the procedure verified as left Breast seed localized excisional biopsy. A Time Out was held and the above information confirmed.  The left breast and chest were prepped and draped in standard fashion. A superolateral circumareolar incision was made near the previously placed radioactive seed.  Dissection was carried down around the point of maximum signal intensity. The cautery was used to perform the dissection.   The specimen was inked with the margin marker paint kit.   Specimen radiography confirmed inclusion of the mammographic seed, but the clip was not present.  Additional margins were taken superiorly, laterally, medially, and inferiorly.  The posterior margin was the open seroma cavity from before, and the anterior margin is skin.  None of these specimens contained the clip either.  One additional lateral margin was taken but the seed was not in this either.   The background  signal in the breast was zero.  Zero clips were placed in the breast cavity. The previous clips were removed from the seroma cavity and the cavity was closed down with 2-0 vicryl.   Hemostasis was achieved with cautery.  Local was injected.  The wound was irrigated and closed with 3-0 vicryl interrupted deep dermal sutures and 4-0 monocryl running subcuticular suture.      Sterile dressings were applied. At the end of the operation, all sponge, instrument, and needle counts were correct.  Findings: Seed, in specimen.  Anterior margin is skin, posterior margin was prior cancer cavity.    Estimated Blood Loss:  min         Specimens: left breast tissue with seed, lateral margin, superior margin, inferior margin, medial margin.  Final lateral margin.           Complications:  None; patient tolerated the procedure well.         Disposition: PACU - hemodynamically stable.         Condition: stable

## 2019-03-02 ENCOUNTER — Encounter: Payer: Self-pay | Admitting: *Deleted

## 2019-03-03 LAB — SURGICAL PATHOLOGY

## 2019-04-24 ENCOUNTER — Encounter: Payer: Self-pay | Admitting: Gastroenterology

## 2019-05-16 NOTE — Progress Notes (Signed)
REVIEWED-NO ADDITIONAL RECOMMENDATIONS. 

## 2019-06-01 ENCOUNTER — Ambulatory Visit (INDEPENDENT_AMBULATORY_CARE_PROVIDER_SITE_OTHER): Payer: 59 | Admitting: Gastroenterology

## 2019-06-01 ENCOUNTER — Other Ambulatory Visit: Payer: Self-pay

## 2019-06-01 ENCOUNTER — Encounter: Payer: Self-pay | Admitting: Gastroenterology

## 2019-06-01 DIAGNOSIS — D126 Benign neoplasm of colon, unspecified: Secondary | ICD-10-CM | POA: Diagnosis not present

## 2019-06-01 DIAGNOSIS — K760 Fatty (change of) liver, not elsewhere classified: Secondary | ICD-10-CM

## 2019-06-01 NOTE — Assessment & Plan Note (Signed)
LAST [place] 2019: FATTY LIVER. NL HFP IN NOV 2020.   CHECK HFP YEARLY.

## 2019-06-01 NOTE — Assessment & Plan Note (Signed)
NO WARNING SIGNS/SYMPTOMS  Next colonoscopy AS EARLY AS MAY 2023 AND NO LATER THAN MAY 2025.

## 2019-06-01 NOTE — Patient Instructions (Addendum)
CHECK A LIVER PANEL EVERY YEAR.    EAT TO LIVE AND THINK OF FOOD AS MEDICINE. 75% OF YOUR PLATE SHOULD BE FRUITS/VEGGIES.  To have more energy, and to lose weight:      1. CONTINUE YOUR WEIGHT LOSS EFFORTS. I RECOMMEND YOU READ AND FOLLOW RECOMMENDATIONS BY DR. MARK HYMAN, "10-DAY DETOX DIET" OR "WHOLE NEW YOU", TIA MOWRY.    2. If you must eat bread, EAT EZEKIEL BREAD. IT IS IN THE FROZEN SECTION OF THE GROCERY STORE.    3. DRINK WATER WITH FRUIT OR CUCUMBER ADDED. YOUR URINE SHOULD BE LIGHT YELLOW. AVOID SODA, GATORADE, ENERGY DRINKS, OR DIET SODA.     4. AVOID HIGH FRUCTOSE CORN SYRUP AND CAFFEINE.     5. DO NOT chew SUGAR FREE GUM OR USE ARTIFICIAL SWEETENERS. IF NEEDED USE STEVIA AS A SWEETENER.    6. DO NOT EAT ENRICHED WHEAT FLOUR, PASTA, RICE, OR CEREAL.    7. ONLY EAT WILD CAUGHT SEAFOOD, GRASS FED BEEF OR CHICKEN, PORK FROM PASTURE RAISE PIGS, OR EGGS FROM PASTURE RAISED CHICKENS.    8. PRACTICE CHAIR YOGA FOR 15-30 MINS 3 OR 4 TIMES A WEEK AND PROGRESS TO HATHA YOGA OVER NEXT 6 MOS.    9. START TAKING A VITAMIN B12, AND VITAMIN D3 2000 IU DAILY.   For 3 mos, TAKE THESE ADDITIONAL SUPPLEMENTS TO DECREASE CRAVING AND SUPPRESS YOUR APPETITE:    1. CINNAMON 500 MG EVERY AM PRIOR TO FIRST MEAL.   **STABILIZES BLOOD GLUCOSE/REDUCES CRAVINGS**    2. CHROMIUM 400-500 MG WITH MEALS TWICE DAILY WITH MEALS.    **FAT BURNER**    3. GREEN TEA EXTRACT ONE DAILY WITH MEALS.   **FAT BURNER/SUPPRESSES YOUR APPETITE**    4. ALPHA LIPOIC ACID TWICE DAILY WITH MEALS.   **NATURAL ANTI-INFLAMMATORY SUPPLEMENT THAT IS AN ALTERNATIVE TO IBUPROFEN OR NAPROXEN**   FOLLOW UP IN 1 YEAR WITH ANNA BOONE OR DR. [facility], [phone].    Next colonoscopy AS EARLY AS MAY 2023 AND NO LATER THAN MAY 2025.

## 2019-06-01 NOTE — Progress Notes (Signed)
Subjective:    Patient ID: Kerri Shah, female    DOB: 1961-02-01, 59 y.o.   MRN: [id number]  Monico Blitz, MD  HPI LAST TCS MAY 2018: ONE SIMPLE ADENOMA. GB OUT IN  JUN 2019. EGD/PYLORIC DILATION/GIVENS DEC 2019.  KNOWN HISTORY OF MICROCYTIC ANEMIA. TAKING IRON EVERY NIGHT. BREAST CANCER/EXCISION JAN 2017([place])/2021([place]). COVID VACCINE COMPLETE. BMs: Q2 DAYS(BSC: #1-3 MOSTLY, RARE #4). DRINKS WATER AND TAKES FIBER SUPPLEMENTS. WEIGHT UP ~10 LBS SINCE OCT 2019. OCCASIONAL FEELS BLOATED/FULL IN UPPER ABDOMEN.  PT DENIES FEVER, CHILLS, HEMATOCHEZIA, HEMATEMESIS, nausea, vomiting, melena, diarrhea, CHEST PAIN, SHORTNESS OF BREATH, CHANGE IN BOWEL IN HABITS, constipation, abdominal pain, problems swallowing, OR  heartburn or indigestion.  Past Medical History:  Diagnosis Date  . Breast cancer ([place]) 2017   left breast lumpectomy  . Breast discharge    lt bloody discharge x's 3 weeks   . Breast mass    lt breast mass x's 3 weeks   . Complication of anesthesia   . DM (diabetes mellitus) ([place])   . Fatty liver   . GERD (gastroesophageal reflux disease)   . History of kidney stones   . Hypercholesteremia   . Hypertension   . Osteoarthritis    bilateral knees and Left shoulder  . Personal history of radiation therapy 04/2016   radiation   . PONV (postoperative nausea and vomiting)   . Vitamin D deficiency     Past Surgical History:  Procedure Laterality Date  . ABDOMINAL HYSTERECTOMY    . BREAST LUMPECTOMY Left 2018  . BREAST LUMPECTOMY WITH RADIOACTIVE SEED LOCALIZATION Left 01/30/2016   Procedure: BREAST LUMPECTOMY WITH RADIOACTIVE SEED LOCALIZATION;  Surgeon: [name], MD;  Location: [facility];  Service: General;  Laterality: Left;  . CHOLECYSTECTOMY N/A 07/30/2017   Procedure: LAPAROSCOPIC CHOLECYSTECTOMY;  Surgeon: Virl Cagey, MD;  Location: [facility];  Service: General;  Laterality: N/A;  . COLONOSCOPY  12/23/10   internal hemorrhoids/polyps in  the recto-sigmoid colon, hyperplastic, TCS IN 5 years  . COLONOSCOPY N/A 06/29/2016   Dr. Oneida Alar: Nonthrombosed external hemorrhoids, 2 2 to 3 mm polyps removed, mild diverticulosis, internal hemorrhoids.  Path revealed tubular adenoma.  Next colonoscopy 5 years based on family history as well.  . ESOPHAGOGASTRODUODENOSCOPY N/A 02/07/2018   Dr. Oneida Alar: Mild gastritis, pyloric stenosis status post dilation.  Placement of Givens capsule at the same time.  Marland Kitchen GIVENS CAPSULE STUDY N/A 02/07/2018   Dr. Oneida Alar: Normal  . PARTIAL HYSTERECTOMY  2010   uterine fibroids  . RADIOACTIVE SEED GUIDED EXCISIONAL BREAST BIOPSY Left 03/01/2019   Procedure: RADIOACTIVE SEED GUIDED EXCISIONAL LEFT BREAST BIOPSY;  Surgeon: [name], MD;  Location: [facility];  Service: General;  Laterality: Left;  . TUBAL LIGATION    . UPPER GASTROINTESTINAL ENDOSCOPY  Nov 2012   mild gastritis, patent esophageal ring s/p forceps dilation, chronic gastritis on path   No Known Allergies  Current Outpatient Medications  Medication Sig    . ferrous sulfate 325 (65 FE) MG EC tablet Take 325 mg by mouth daily.     Marland Kitchen glimepiride (AMARYL) 2 MG tablet Take 2 mg by mouth daily.    . metFORMIN (GLUCOPHAGE-XR) 500 MG 24 hr tablet Take 500 mg by mouth at bedtime.     . Multiple Vitamins-Minerals (MULTIVITAMIN WITH MINERALS) tablet Take 1 tablet by mouth daily.    .      . pravastatin (PRAVACHOL) 40 MG tablet Take 40 mg by mouth at bedtime.     Marland Kitchen  tamoxifen (NOLVADEX) 20 MG tablet Take 1 tablet (20 mg total) by mouth at bedtime.     Review of Systems PER HPI OTHERWISE ALL SYSTEMS ARE NEGATIVE.    Objective:   Physical Exam Constitutional:      General: She is not in acute distress.    Appearance: Normal appearance.  HENT:     Mouth/Throat:     Comments: MASK IN PLACE Eyes:     General: No scleral icterus.    Pupils: Pupils are equal, round, and reactive to light.  Cardiovascular:     Rate and Rhythm: Normal  rate and regular rhythm.     Pulses: Normal pulses.     Heart sounds: Normal heart sounds.  Pulmonary:     Effort: Pulmonary effort is normal.     Breath sounds: Normal breath sounds.  Abdominal:     General: Bowel sounds are normal.     Palpations: Abdomen is soft.     Tenderness: There is no abdominal tenderness.  Musculoskeletal:     Cervical back: Normal range of motion.     Right lower leg: No edema.     Left lower leg: No edema.  Lymphadenopathy:     Cervical: No cervical adenopathy.  Skin:    General: Skin is warm and dry.  Neurological:     Mental Status: She is alert and oriented to person, place, and time.     Comments: NO  NEW FOCAL DEFICITS  Psychiatric:        Mood and Affect: Mood normal.     Comments: NORMAL AFFECT       Assessment & Plan:

## 2019-09-15 ENCOUNTER — Ambulatory Visit
Admission: RE | Admit: 2019-09-15 | Discharge: 2019-09-15 | Disposition: A | Discharge status: Home / Self Care | Payer: 59 | Source: Ambulatory Visit | Attending: General Surgery | Admitting: General Surgery

## 2019-09-15 ENCOUNTER — Other Ambulatory Visit: Payer: Self-pay | Admitting: General Surgery

## 2019-09-15 ENCOUNTER — Other Ambulatory Visit: Payer: Self-pay

## 2019-09-15 DIAGNOSIS — N611 Abscess of the breast and nipple: Secondary | ICD-10-CM

## 2019-09-20 ENCOUNTER — Other Ambulatory Visit: Payer: Self-pay

## 2019-09-20 ENCOUNTER — Other Ambulatory Visit: Payer: Self-pay | Admitting: General Surgery

## 2019-09-20 ENCOUNTER — Ambulatory Visit
Admission: RE | Admit: 2019-09-20 | Discharge: 2019-09-20 | Disposition: A | Discharge status: Home / Self Care | Payer: 59 | Source: Ambulatory Visit | Attending: General Surgery | Admitting: General Surgery

## 2019-09-20 DIAGNOSIS — N611 Abscess of the breast and nipple: Secondary | ICD-10-CM

## 2019-09-20 LAB — AEROBIC/ANAEROBIC CULTURE W GRAM STAIN (SURGICAL/DEEP WOUND): Culture: NORMAL

## 2019-09-21 ENCOUNTER — Other Ambulatory Visit: Payer: Self-pay | Admitting: General Surgery

## 2019-09-21 ENCOUNTER — Ambulatory Visit
Admission: RE | Admit: 2019-09-21 | Discharge: 2019-09-21 | Disposition: A | Discharge status: Home / Self Care | Payer: 59 | Source: Ambulatory Visit | Attending: General Surgery | Admitting: General Surgery

## 2019-09-21 DIAGNOSIS — N611 Abscess of the breast and nipple: Secondary | ICD-10-CM

## 2019-09-26 ENCOUNTER — Other Ambulatory Visit: Payer: 59

## 2019-10-05 ENCOUNTER — Ambulatory Visit
Admission: RE | Admit: 2019-10-05 | Discharge: 2019-10-05 | Disposition: A | Discharge status: Home / Self Care | Payer: 59 | Source: Ambulatory Visit | Attending: General Surgery | Admitting: General Surgery

## 2019-10-05 ENCOUNTER — Other Ambulatory Visit: Payer: Self-pay

## 2019-10-05 DIAGNOSIS — N611 Abscess of the breast and nipple: Secondary | ICD-10-CM

## 2019-11-22 ENCOUNTER — Other Ambulatory Visit: Payer: Self-pay | Admitting: General Surgery

## 2019-11-22 DIAGNOSIS — Z9889 Other specified postprocedural states: Secondary | ICD-10-CM

## 2019-12-19 ENCOUNTER — Ambulatory Visit
Admission: RE | Admit: 2019-12-19 | Discharge: 2019-12-19 | Disposition: A | Discharge status: Home / Self Care | Payer: 59 | Source: Ambulatory Visit | Attending: General Surgery | Admitting: General Surgery

## 2019-12-19 ENCOUNTER — Other Ambulatory Visit: Payer: Self-pay

## 2019-12-19 ENCOUNTER — Other Ambulatory Visit: Payer: Self-pay | Admitting: General Surgery

## 2019-12-19 DIAGNOSIS — N644 Mastodynia: Secondary | ICD-10-CM

## 2019-12-19 DIAGNOSIS — Z9889 Other specified postprocedural states: Secondary | ICD-10-CM

## 2019-12-21 ENCOUNTER — Other Ambulatory Visit: Payer: Self-pay | Admitting: Hematology and Oncology

## 2019-12-27 IMAGING — MG DIGITAL DIAGNOSTIC BILAT W/ TOMO
6 of 9 series · 6 of 25 positions shown · non-contrast
Comparison: Breast MRI August 03, 2018 and prior mammograms

CLINICAL DATA: 58-year-old patient presents for routine annual
examination. She has a history of left breast cancer status post
lumpectomy in 2850. She presented with bloody left nipple discharge
in Wednesday July, 2018 breast MRI was recommended and performed. A
six-month follow-up left breast MRI was suggested.The patient
reports today that she is no longer having bloody nipple discharge.

EXAM:
DIGITAL DIAGNOSTIC BILATERAL MAMMOGRAM WITH CAD AND TOMO

[L CC]
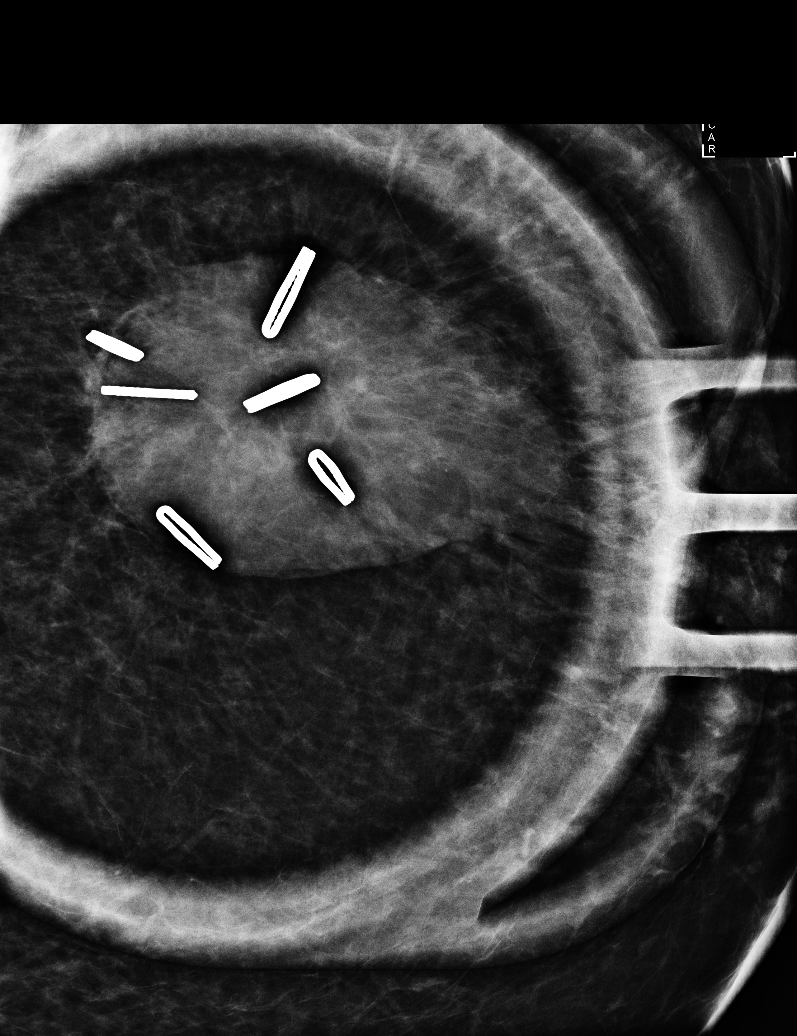

[R CC synth-2D]
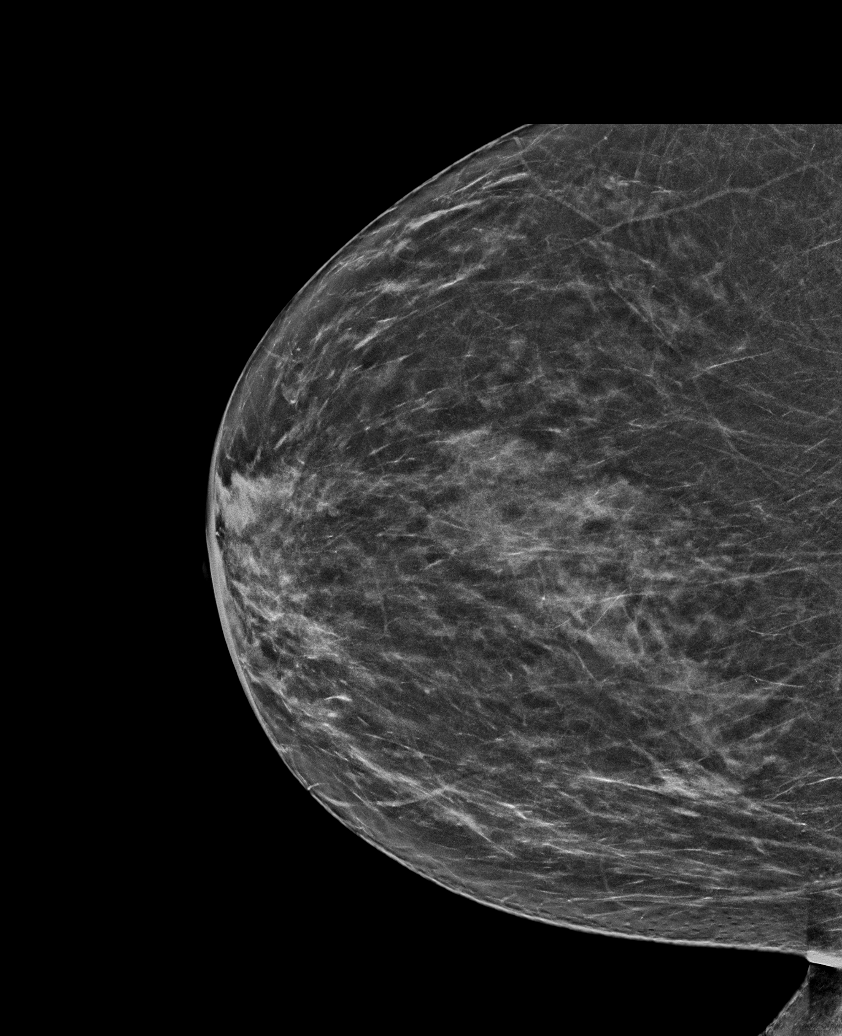

[R MLO synth-2D]
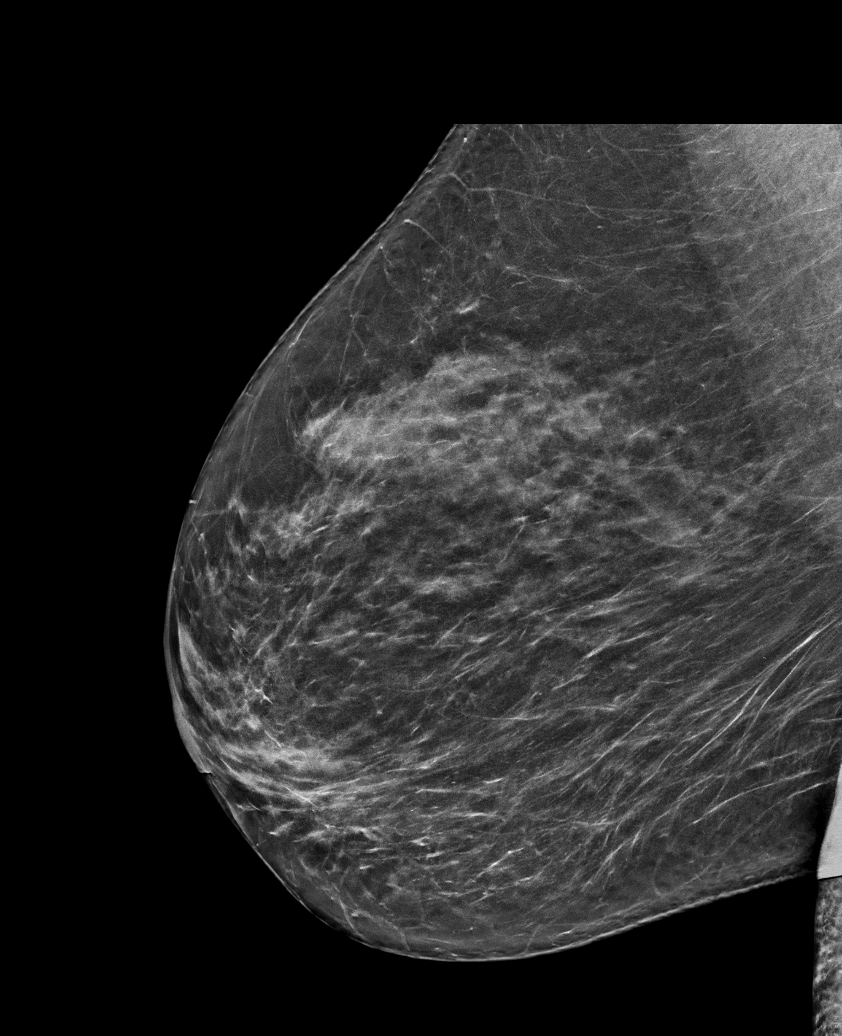

[L MLO synth-2D]
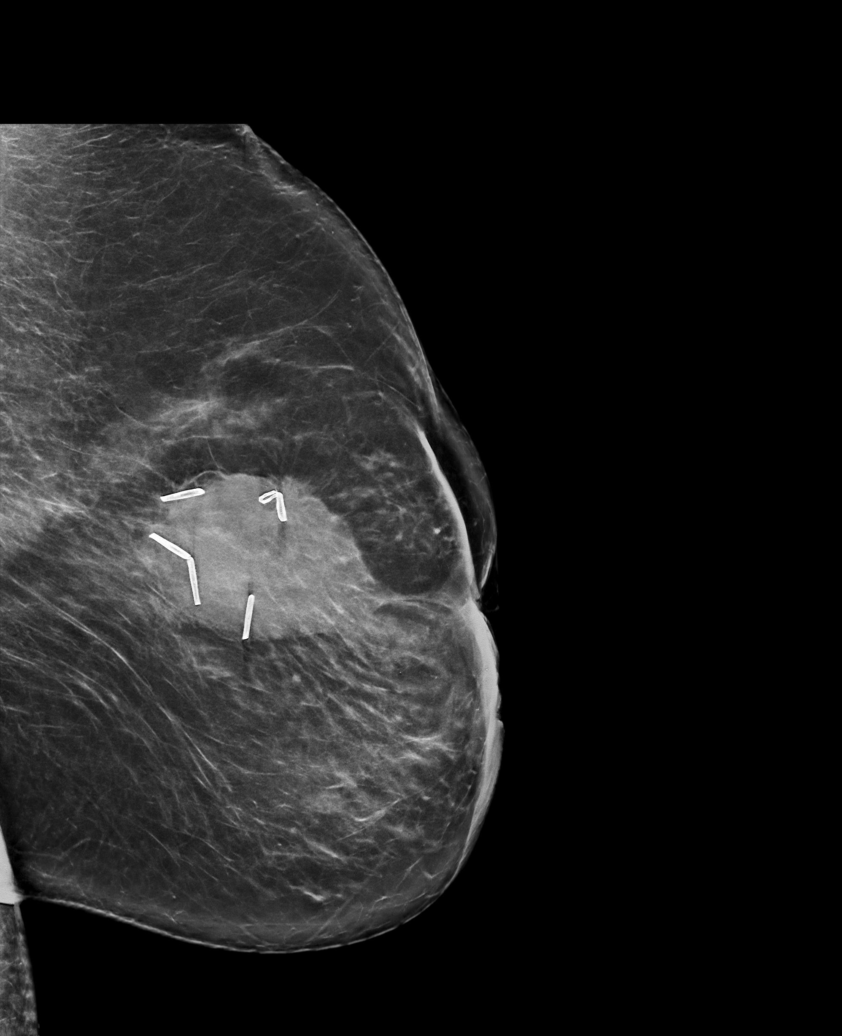

[L CC synth-2D]
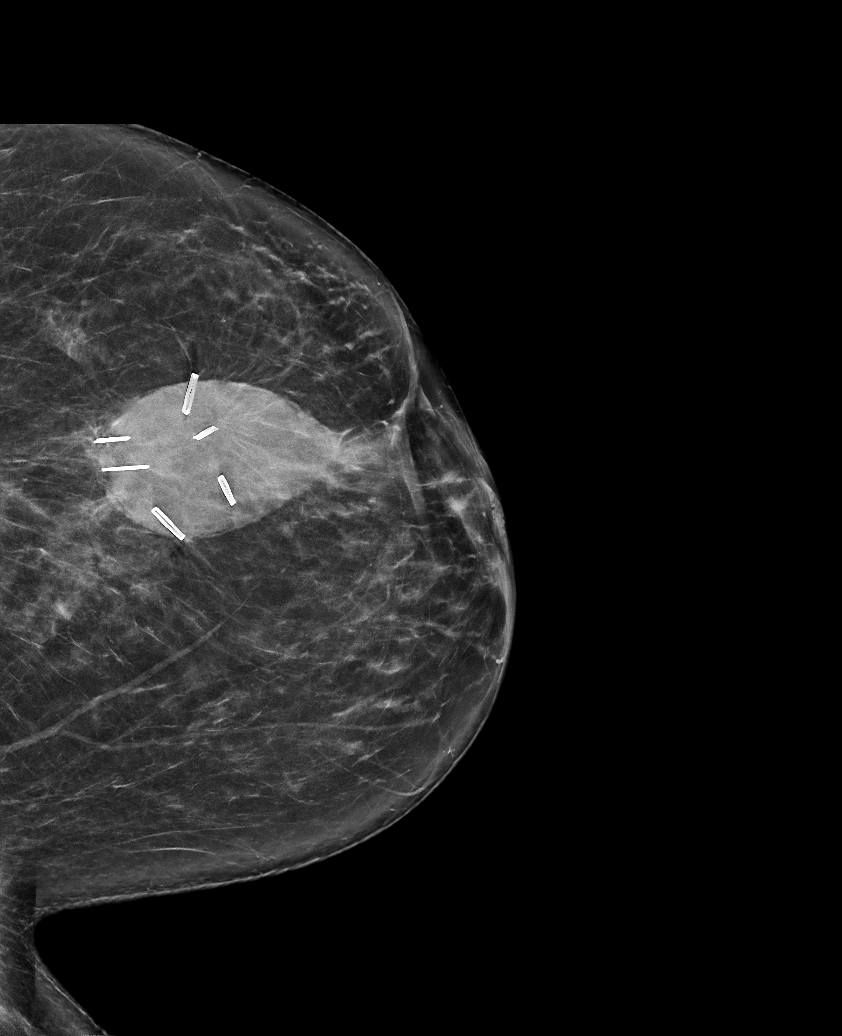

[R MLO tomo · tomo slice 38/75.0]
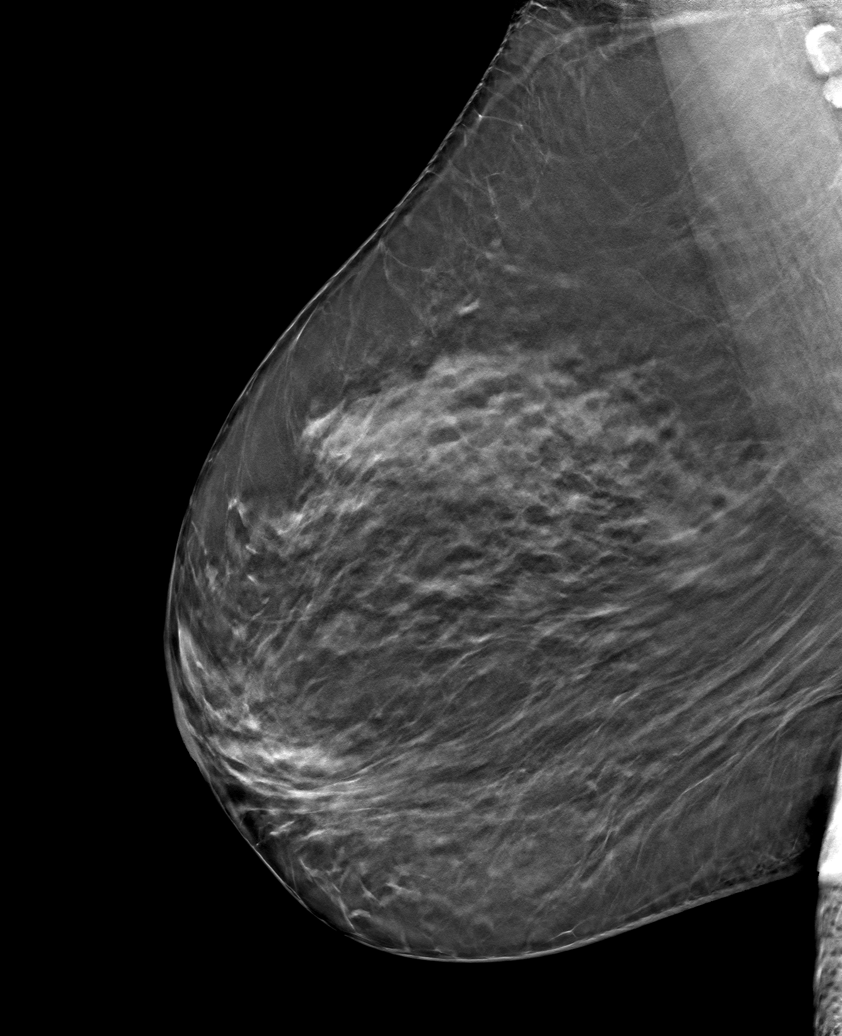

[6 of 25 positions shown; findings below may reference images not displayed]

ACR Breast Density Category b: There are scattered areas of
fibroglandular density.
FINDINGS: Postoperative seroma in the outer left breast is stable. Surgical
clips are present in this region. No suspicious mass, architectural
distortion, or suspicious microcalcification is identified in either
breast to suggest malignancy.

Mammographic images were processed with CAD.
IMPRESSION: No mammographic evidence of malignancy in either breast.

RECOMMENDATION:
A follow-up bilateral breast MRI was recommended for January 2019.
This was discussed with the patient today.

Diagnostic mammogram is suggested in 1 year. (Code:[id number])

I have discussed the findings and recommendations with the patient.
If applicable, a reminder letter will be sent to the patient
regarding the next appointment.

BI-RADS CATEGORY  2: Benign.

## 2019-12-28 ENCOUNTER — Encounter (HOSPITAL_COMMUNITY): Payer: Self-pay | Admitting: Physical Therapy

## 2019-12-28 ENCOUNTER — Ambulatory Visit (HOSPITAL_COMMUNITY): Payer: 59 | Attending: General Surgery | Admitting: Physical Therapy

## 2019-12-28 ENCOUNTER — Other Ambulatory Visit: Payer: Self-pay

## 2019-12-28 DIAGNOSIS — I972 Postmastectomy lymphedema syndrome: Secondary | ICD-10-CM | POA: Insufficient documentation

## 2019-12-28 NOTE — Therapy (Signed)
[place], [place], [address] Phone: [phone]   Fax:  [phone]  Physical Therapy Evaluation  Patient Details  Name: Kerri Shah MRN: [id number] Date of Birth: 23-May-1960 Referring Provider (PT): [name]   Encounter Date: 12/28/2019   PT End of Session - 12/28/19 1518    Visit Number 1    Number of Visits 4    Date for PT Re-Evaluation 01/18/20    Authorization Type [employer]    Authorization - Visit Number 1    Authorization - Number of Visits 30    Progress Note Due on Visit 4    PT Start Time 2025    PT Stop Time 1400    PT Time Calculation (min) 45 min    Activity Tolerance Patient tolerated treatment well    Behavior During Therapy [facility] for tasks assessed/performed           Past Medical History:  Diagnosis Date  . Breast cancer ([place]) 2017   left breast lumpectomy  . Breast discharge    lt bloody discharge x's 3 weeks   . Breast mass    lt breast mass x's 3 weeks   . Complication of anesthesia   . DM (diabetes mellitus) ([place])   . Fatty liver   . GERD (gastroesophageal reflux disease)   . History of kidney stones   . Hypercholesteremia   . Hypertension   . Osteoarthritis    bilateral knees and Left shoulder  . Personal history of radiation therapy 04/2016   radiation   . PONV (postoperative nausea and vomiting)   . Vitamin D deficiency     Past Surgical History:  Procedure Laterality Date  . ABDOMINAL HYSTERECTOMY    . BREAST LUMPECTOMY Left 2018  . BREAST LUMPECTOMY Left 03/01/2019  . BREAST LUMPECTOMY WITH RADIOACTIVE SEED LOCALIZATION Left 01/30/2016   Procedure: BREAST LUMPECTOMY WITH RADIOACTIVE SEED LOCALIZATION;  Surgeon: [name], MD;  Location: [facility];  Service: General;  Laterality: Left;  . CHOLECYSTECTOMY N/A 07/30/2017   Procedure: LAPAROSCOPIC CHOLECYSTECTOMY;  Surgeon: Virl Cagey, MD;  Location: [facility];  Service:  General;  Laterality: N/A;  . COLONOSCOPY  12/23/10   internal hemorrhoids/polyps in the recto-sigmoid colon, hyperplastic, TCS IN 5 years  . COLONOSCOPY N/A 06/29/2016   Dr. Oneida Alar: Nonthrombosed external hemorrhoids, 2 2 to 3 mm polyps removed, mild diverticulosis, internal hemorrhoids.  Path revealed tubular adenoma.  Next colonoscopy 5 years based on family history as well.  . ESOPHAGOGASTRODUODENOSCOPY N/A 02/07/2018   Dr. Oneida Alar: Mild gastritis, pyloric stenosis status post dilation.  Placement of Givens capsule at the same time.  Marland Kitchen GIVENS CAPSULE STUDY N/A 02/07/2018   Dr. Oneida Alar: Normal  . PARTIAL HYSTERECTOMY  2010   uterine fibroids  . RADIOACTIVE SEED GUIDED EXCISIONAL BREAST BIOPSY Left 03/01/2019   Procedure: RADIOACTIVE SEED GUIDED EXCISIONAL LEFT BREAST BIOPSY;  Surgeon: [name], MD;  Location: [facility];  Service: General;  Laterality: Left;  . TUBAL LIGATION    . UPPER GASTROINTESTINAL ENDOSCOPY  Nov 2012   mild gastritis, patent esophageal ring s/p forceps dilation, chronic gastritis on path    There were no vitals filed for this visit.    Subjective Assessment - 12/28/19 1315    Subjective Pt states that she has been seen in [place] for her lymphedema in the past.  She has a compression pump that she has not used due to the fact that  it seems to be squeezing her arm to tight.  She has an arm sleeve but only uses it when she feels that her arm is swelling and at this time it feels like it is normal.  She comes to the department because this summer all the fluid seemed to go into her Lt breast.  They drained it 3-4 times and then finally put a drain in. The breast is doing well now but the MD wanted her to come for instruction in [place] and exercises.    Pertinent History Pt dx with LT breast cancer, had a lumpectomy on 01/30/2016. PT developed lymphedema and was sent to PT where she recieved a flexitouch pump.    Currently in Pain? No/denies               [facility] PT Assessment - 12/28/19 0001      Assessment   Medical Diagnosis Lt UE lymphedema    Referring Provider (PT) [name]    Next MD Visit not scheduled     Prior Therapy none       Precautions   Precautions --   cellulitis     Restrictions   Weight Bearing Restrictions No      Balance Screen   Has the patient fallen in the past 6 months No    Has the patient had a decrease in activity level because of a fear of falling?  No    Is the patient reluctant to leave their home because of a fear of falling?  No      Home Ecologist residence      Prior Function   Level of Independence Independent      Cognition   Overall Cognitive Status Within Functional Limits for tasks assessed             LYMPHEDEMA/ONCOLOGY QUESTIONNAIRE - 12/28/19 0001      Type   Cancer Type Breast      What other symptoms do you have   Are you Having Heaviness or Tightness No    Are you having Pain No    Are you having pitting edema No      Lymphedema Stage   Stage STAGE 1 SPONTANEOUSLY REVERSIBLE      Lymphedema Assessments   Lymphedema Assessments Upper extremities      Right Upper Extremity Lymphedema   At Axilla  32.5 cm    15 cm Proximal to Olecranon Process 30.8 cm    10 cm Proximal to Olecranon Process 29.4 cm    Olecranon Process 25.5 cm    15 cm Proximal to Ulnar Styloid Process 23 cm    10 cm Proximal to Ulnar Styloid Process 19.7 cm    Just Proximal to Ulnar Styloid Process 16 cm    Across Hand at [employer] 19.8 cm      Left Upper Extremity Lymphedema   At Axilla  33 cm    15 cm Proximal to Olecranon Process 31.5 cm    10 cm Proximal to Olecranon Process 30.4 cm    Olecranon Process 6.2 cm    15 cm Proximal to Ulnar Styloid Process 32.3 cm    10 cm Proximal to Ulnar Styloid Process 19.1 cm    Just Proximal to Ulnar Styloid Process 16 cm    Across Hand at [employer] 20.5 cm                   Objective  measurements completed on examination: See above findings.               PT Education - 12/28/19 1517    Education Details UE exercises, diaphragm breathing and self manual for LT UE    Person(s) Educated Patient    Methods Explanation;Handout;Tactile cues;Verbal cues    Comprehension Verbalized understanding;Returned demonstration            PT Short Term Goals - 12/28/19 1706      PT SHORT TERM GOAL #1   Title PT to be I in self massage techniques to decrease breast and axillary swelling    Time 3    Period Weeks    Status New    Target Date 01/18/20                     Plan - 12/28/19 1519    Clinical Impression Statement Pt is a 59 yo who was dx with Lt breast cancer years ago.  She had a lumpectomy, chemo and radiation.  After the lumpectomy she developed lymphedema and was educated on lymphedema, obtained a compression garment and a flexitouch.  She states that the flexitouch pumps to hard at this time an she is unable to use it. The therapist advised her to call the company that she obtained it from.  The Pt states that her arm swelling is well controlled, however, she contiues to have breast and axillary swelling.  The PT was referred to skilled therapy for education on how to control her breast swelling. Ms. Ulysees Barns will benefit from educatoin on self, manual and  compression bras.    Personal Factors and Comorbidities Comorbidity 1;Past/Current Experience    Comorbidities breast cancer, lymphedema    Examination-Activity Limitations Other    Examination-Participation Restrictions Other    Stability/Clinical Decision Making Stable/Uncomplicated    Clinical Decision Making Low    Rehab Potential Good    PT Frequency 1x / week    PT Duration 4 weeks    PT Treatment/Interventions Manual lymph drainage    PT Next Visit Plan check to see if order for compression bra has returned, complete manual decongestion techniques educating pt on self manual as  therapist is completing manual           Patient will benefit from skilled therapeutic intervention in order to improve the following deficits and impairments:  Increased edema  Visit Diagnosis: Postmastectomy lymphedema     Problem List Patient Active Problem List   Diagnosis Date Noted  . Colon adenomas 06/01/2019  . Leukopenia 11/12/2017  . Anemia 11/12/2017  . Cholelithiasis 07/19/2017  . Constipation 04/21/2017  . Abdominal pain, epigastric 04/21/2017  . Encounter for screening colonoscopy   . 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 Carcinoma of upper-outer quadrant of left breast in female, estrogen receptor positive ([place]) 01/21/2016  . Hematochezia 12/01/2010  . RUQ pain 12/01/2010  . Family history of colon cancer 12/01/2010  . Fatty liver 12/01/2010    Rayetta Humphrey, PT CLT [phone] 12/28/2019, 5:07 PM  [facility] [address] [place], [place], [address] Phone: [phone]   Fax:  [phone]  Name: Mykala Mccready MRN: [id number] Date of Birth: 1960-03-15

## 2020-01-01 ENCOUNTER — Telehealth: Payer: Self-pay | Admitting: Hematology and Oncology

## 2020-01-01 NOTE — Telephone Encounter (Signed)
R/s 11/24 appt per sch msg. Called and spoke with patient. Confirmed new date and time

## 2020-01-02 ENCOUNTER — Ambulatory Visit (HOSPITAL_COMMUNITY): Payer: 59 | Admitting: Physical Therapy

## 2020-01-03 ENCOUNTER — Inpatient Hospital Stay: Payer: 59

## 2020-01-03 ENCOUNTER — Inpatient Hospital Stay: Payer: 59 | Admitting: Hematology and Oncology

## 2020-01-09 ENCOUNTER — Encounter (HOSPITAL_COMMUNITY): Payer: Self-pay

## 2020-01-09 ENCOUNTER — Ambulatory Visit (HOSPITAL_COMMUNITY): Payer: 59

## 2020-01-09 ENCOUNTER — Other Ambulatory Visit: Payer: Self-pay

## 2020-01-09 DIAGNOSIS — I972 Postmastectomy lymphedema syndrome: Secondary | ICD-10-CM

## 2020-01-09 NOTE — Therapy (Signed)
[name], [place], [address] Phone: [phone]   Fax:  [phone]  Physical Therapy Treatment  Patient Details  Name: Charene Mccallister MRN: [id number] Date of Birth: 09-Jun-1960 Referring Provider (PT): [name]   Encounter Date: 01/09/2020   PT End of Session - 01/09/20 1318    Visit Number 2    Number of Visits 4    Date for PT Re-Evaluation 01/18/20    Authorization Type [employer]    Authorization - Visit Number 2    Authorization - Number of Visits 30    Progress Note Due on Visit 4    PT Start Time 1314    PT Stop Time 1352    PT Time Calculation (min) 38 min    Activity Tolerance Patient tolerated treatment well    Behavior During Therapy [facility] for tasks assessed/performed           Past Medical History:  Diagnosis Date  . Breast cancer ([place]) 2017   left breast lumpectomy  . Breast discharge    lt bloody discharge x's 3 weeks   . Breast mass    lt breast mass x's 3 weeks   . Complication of anesthesia   . DM (diabetes mellitus) ([place])   . Fatty liver   . GERD (gastroesophageal reflux disease)   . History of kidney stones   . Hypercholesteremia   . Hypertension   . Osteoarthritis    bilateral knees and Left shoulder  . Personal history of radiation therapy 04/2016   radiation   . PONV (postoperative nausea and vomiting)   . Vitamin D deficiency     Past Surgical History:  Procedure Laterality Date  . ABDOMINAL HYSTERECTOMY    . BREAST LUMPECTOMY Left 2018  . BREAST LUMPECTOMY Left 03/01/2019  . BREAST LUMPECTOMY WITH RADIOACTIVE SEED LOCALIZATION Left 01/30/2016   Procedure: BREAST LUMPECTOMY WITH RADIOACTIVE SEED LOCALIZATION;  Surgeon: [name], MD;  Location: [facility];  Service: General;  Laterality: Left;  . CHOLECYSTECTOMY N/A 07/30/2017   Procedure: LAPAROSCOPIC CHOLECYSTECTOMY;  Surgeon: Virl Cagey, MD;  Location: [facility];  Service:  General;  Laterality: N/A;  . COLONOSCOPY  12/23/10   internal hemorrhoids/polyps in the recto-sigmoid colon, hyperplastic, TCS IN 5 years  . COLONOSCOPY N/A 06/29/2016   Dr. Oneida Alar: Nonthrombosed external hemorrhoids, 2 2 to 3 mm polyps removed, mild diverticulosis, internal hemorrhoids.  Path revealed tubular adenoma.  Next colonoscopy 5 years based on family history as well.  . ESOPHAGOGASTRODUODENOSCOPY N/A 02/07/2018   Dr. Oneida Alar: Mild gastritis, pyloric stenosis status post dilation.  Placement of Givens capsule at the same time.  Marland Kitchen GIVENS CAPSULE STUDY N/A 02/07/2018   Dr. Oneida Alar: Normal  . PARTIAL HYSTERECTOMY  2010   uterine fibroids  . RADIOACTIVE SEED GUIDED EXCISIONAL BREAST BIOPSY Left 03/01/2019   Procedure: RADIOACTIVE SEED GUIDED EXCISIONAL LEFT BREAST BIOPSY;  Surgeon: [name], MD;  Location: [facility];  Service: General;  Laterality: Left;  . TUBAL LIGATION    . UPPER GASTROINTESTINAL ENDOSCOPY  Nov 2012   mild gastritis, patent esophageal ring s/p forceps dilation, chronic gastritis on path    There were no vitals filed for this visit.   Subjective Assessment - 01/09/20 1316    Subjective Pt reports she is feeling okay.  Reports she was unable to call the company regarding the pump, does plan to call them soom.  Reports she has began HEP without questions.  Pertinent History Pt dx with LT breast cancer, had a lumpectomy on 01/30/2016. PT developed lymphedema and was sent to PT where she recieved a flexitouch pump.    Currently in Pain? Yes    Pain Score 4     Pain Location Hand    Pain Orientation Left    Pain Descriptors / Indicators Sharp;Sore;Aching    Pain Type Chronic pain    Aggravating Factors  certain movements, lifting grandkids              [facility] PT Assessment - 01/09/20 0001      Assessment   Medical Diagnosis Lt UE lymphedema    Referring Provider (PT) [name]    Next MD Visit not scheduled     Prior Therapy none                           [facility] Adult PT Treatment/Exercise - 01/09/20 0001      Manual Therapy   Manual Therapy Manual Lymphatic Drainage (MLD)    Manual therapy comments Manual complete separate than rest of tx    Manual Lymphatic Drainage (MLD) Neck, superficial and deep abdminal, axillary to inguinal                  PT Education - 01/09/20 1355    Education Details Reviewed goals, assured compliance with HEP, educated on self manual for Lt UE    Person(s) Educated Patient    Methods Explanation;Demonstration;Verbal cues    Comprehension Verbalized understanding;Returned demonstration            PT Short Term Goals - 12/28/19 1706      PT SHORT TERM GOAL #1   Title PT to be I in self massage techniques to decrease breast and axillary swelling    Time 3    Period Weeks    Status New    Target Date 01/18/20                    Plan - 01/09/20 1349    Clinical Impression Statement Reviewed evaluation findings and STGs.  Discussed importance of HEP compliance for maximal benefits.  Manual decognestive lymphedema techniques complete to address swelling in breast and axillary.  Educated self care techniques to assist with edema control, pt able to complete with cueing to reduce pressure and correct direction wiht fluids.  Signed order not received for compression bra.    Comorbidities breast cancer, lymphedema    Examination-Activity Limitations Other    Examination-Participation Restrictions Other    Stability/Clinical Decision Making Stable/Uncomplicated    Clinical Decision Making Low    Rehab Potential Good    PT Frequency 1x / week    PT Duration 4 weeks    PT Treatment/Interventions Manual lymph drainage    PT Next Visit Plan check to see if order for compression bra has returned, complete manual decongestion techniques educating pt on self manual as therapist is completing manual           Patient will benefit from skilled therapeutic  intervention in order to improve the following deficits and impairments:  Increased edema  Visit Diagnosis: Postmastectomy lymphedema     Problem List Patient Active Problem List   Diagnosis Date Noted  . Colon adenomas 06/01/2019  . Leukopenia 11/12/2017  . Anemia 11/12/2017  . Cholelithiasis 07/19/2017  . Constipation 04/21/2017  . Abdominal pain, epigastric 04/21/2017  . Encounter for screening colonoscopy   .  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 Carcinoma of upper-outer quadrant of left breast in female, estrogen receptor positive ([place]) 01/21/2016  . Hematochezia 12/01/2010  . RUQ pain 12/01/2010  . Family history of colon cancer 12/01/2010  . Fatty liver 12/01/2010   Ihor Austin, LPTA/CLT; [place]  Aldona Lento 01/09/2020, 1:55 PM  [facility] [address] [place], [place], [address] Phone: [phone]   Fax:  [phone]  Name: Jazyiah Yiu MRN: [id number] Date of Birth: March 20, 1960

## 2020-01-16 ENCOUNTER — Ambulatory Visit (HOSPITAL_COMMUNITY): Payer: 59 | Admitting: Physical Therapy

## 2020-01-23 ENCOUNTER — Ambulatory Visit (HOSPITAL_COMMUNITY): Payer: 59 | Attending: General Surgery | Admitting: Physical Therapy

## 2020-01-23 ENCOUNTER — Other Ambulatory Visit: Payer: Self-pay

## 2020-01-23 DIAGNOSIS — I972 Postmastectomy lymphedema syndrome: Secondary | ICD-10-CM | POA: Insufficient documentation

## 2020-01-23 NOTE — Therapy (Addendum)
[facility] [address] [place], [place], [address] Phone: [phone]   Fax:  [phone]  Physical Therapy Treatment  Patient Details  Name: Kerri Shah MRN: [id number] Date of Birth: 1960/07/28 Referring Provider (PT): [name]  PHYSICAL THERAPY DISCHARGE SUMMARY  Visits from Start of Care: 3  Current functional level related to goals / functional outcomes: See below   Remaining deficits: See below   Education / Equipment: HEP Plan: Patient agrees to discharge.  Patient goals were partially met. Patient is being discharged due to being pleased with the current functional level.  ?????       Encounter Date: 01/23/2020   PT End of Session - 01/23/20 1335    Visit Number 3    Number of Visits 3   Date for PT Re-Evaluation 01/18/20    Authorization Type [employer]    Authorization - Visit Number 3    Authorization - Number of Visits 30    Progress Note Due on Visit 4    PT Start Time 1320    PT Stop Time 1355    PT Time Calculation (min) 35 min    Activity Tolerance Patient tolerated treatment well    Behavior During Therapy WFL for tasks assessed/performed           Past Medical History:  Diagnosis Date  . Breast cancer ([place]) 2017   left breast lumpectomy  . Breast discharge    lt bloody discharge x's 3 weeks   . Breast mass    lt breast mass x's 3 weeks   . Complication of anesthesia   . DM (diabetes mellitus) ([place] [facility])   . Fatty liver   . GERD (gastroesophageal reflux disease)   . History of kidney stones   . Hypercholesteremia   . Hypertension   . Osteoarthritis    bilateral knees and Left shoulder  . Personal history of radiation therapy 04/2016   radiation   . PONV (postoperative nausea and vomiting)   . Vitamin D deficiency     Past Surgical History:  Procedure Laterality Date  . ABDOMINAL HYSTERECTOMY    . BREAST LUMPECTOMY Left 2018  . BREAST LUMPECTOMY Left 03/01/2019  .  BREAST LUMPECTOMY WITH RADIOACTIVE SEED LOCALIZATION Left 01/30/2016   Procedure: BREAST LUMPECTOMY WITH RADIOACTIVE SEED LOCALIZATION;  Surgeon: [name], MD;  Location: [facility];  Service: General;  Laterality: Left;  . CHOLECYSTECTOMY N/A 07/30/2017   Procedure: LAPAROSCOPIC CHOLECYSTECTOMY;  Surgeon: Virl Cagey, MD;  [facility];  Service: General;  Laterality: N/A;  . COLONOSCOPY  12/23/10   internal hemorrhoids/polyps in the recto-sigmoid colon, hyperplastic, TCS IN 5 years  . COLONOSCOPY N/A 06/29/2016   Dr. Oneida Alar: Nonthrombosed external hemorrhoids, 2 2 to 3 mm polyps removed, mild diverticulosis, internal hemorrhoids.  Path revealed tubular adenoma.  Next colonoscopy 5 years based on family history as well.  . ESOPHAGOGASTRODUODENOSCOPY N/A 02/07/2018   Dr. Oneida Alar: Mild gastritis, pyloric stenosis status post dilation.  Placement of Givens capsule at the same time.  Marland Kitchen GIVENS CAPSULE STUDY N/A 02/07/2018   Dr. Oneida Alar: Normal  . PARTIAL HYSTERECTOMY  2010   uterine fibroids  . RADIOACTIVE SEED GUIDED EXCISIONAL BREAST BIOPSY Left 03/01/2019   Procedure: RADIOACTIVE SEED GUIDED EXCISIONAL LEFT BREAST BIOPSY;  Surgeon: [name], MD;  Location: [facility];  Service: General;  Laterality: Left;  . TUBAL LIGATION    . UPPER GASTROINTESTINAL ENDOSCOPY  Nov 2012   mild  gastritis, patent esophageal ring s/p forceps dilation, chronic gastritis on path    There were no vitals filed for this visit.   Subjective Assessment - 01/23/20 1323    Subjective pt states she has been busy with grandbabies and that is why she has not been to therapy X 2 weeks.  STates she has not been doing her self massage as she should.  No pain and states it is a little better except around her nipple area.    Currently in Pain? No/denies              [facility] PT Assessment - 01/23/20 0001      Assessment   Medical Diagnosis Lt UE lymphedema    Referring  Provider (PT) [name]    Next MD Visit not scheduled     Prior Therapy none                 [facility] Adult PT Treatment/Exercise - 01/23/20 0001      Manual Therapy   Manual Therapy Manual Lymphatic Drainage (MLD);Other (comment)    Manual therapy comments Manual complete separate than rest of tx    Manual Lymphatic Drainage (MLD) Neck, superficial and deep abdminal, axillary to inguinal    Other Manual Therapy self instruction and application                  PT Education - 01/23/20 1435    Education Details further self massage, educational materials on self massage and instructions for obtaining compression bra at [facility] [place].    Person(s) Educated Patient    Methods Explanation;Demonstration;Tactile cues;Verbal cues;Handout    Comprehension Verbalized understanding;Returned demonstration;Verbal cues required            PT Short Term Goals - 01/23/20 1342      PT SHORT TERM GOAL #1   Title PT to be I in self massage techniques to decrease breast and axillary swelling    Time 3    Period Weeks    Status Achieved    Target Date 01/18/20                    Plan - 01/23/20 1428    Clinical Impression Statement Pt returns today following 2 week absence.  STates she is completing self massage but not as regularly as she should.  Pt completed self massage with cues from therapist regarding steps, however general technique is completed unassisted.  Additional printouts given regarding lymphedema education and self massage.  Pt given written order for compression bra and information regarding facility to call and make appointment.  Instructed pateint to let physician know if she needs additional equiptment, i.e. compression sleeve for Lt UE or strengthening for Rt UE (pt reports current pain and immobility in Rt UE).  Pt feels she is ready to be discharged from skilled therapy at this time and continue with self massage/self care.    Comorbidities breast  cancer, lymphedema    Examination-Activity Limitations Other    Examination-Participation Restrictions Other    Stability/Clinical Decision Making Stable/Uncomplicated    Rehab Potential Good    PT Frequency 1x / week    PT Duration 4 weeks    PT Treatment/Interventions Manual lymph drainage    PT Next Visit Plan Discharge to self care.           Patient will benefit from skilled therapeutic intervention in order to improve the following deficits and impairments:  Increased edema  Visit  Diagnosis: Postmastectomy lymphedema     Problem List Patient Active Problem List   Diagnosis Date Noted  . Colon adenomas 06/01/2019  . Leukopenia 11/12/2017  . Anemia 11/12/2017  . Cholelithiasis 07/19/2017  . Constipation 04/21/2017  . Abdominal pain, epigastric 04/21/2017  . Encounter for screening colonoscopy   . 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 Carcinoma of upper-outer quadrant of left breast in female, estrogen receptor positive ([place]) 01/21/2016  . Hematochezia 12/01/2010  . RUQ pain 12/01/2010  . Family history of colon cancer 12/01/2010  . Fatty liver 12/01/2010   Teena Irani, PTA/CLT [facility], PT CLT [phone] 01/23/2020, 2:39 PM  [facility] [address] [place], [place], [address] Phone: [phone]   Fax:  [phone]  Name: Kerri Shah MRN: [id number] Date of Birth: 12-03-1960

## 2020-02-19 ENCOUNTER — Other Ambulatory Visit: Payer: Self-pay | Admitting: *Deleted

## 2020-02-19 DIAGNOSIS — D0512 Intraductal carcinoma in situ of left breast: Secondary | ICD-10-CM

## 2020-02-19 NOTE — Progress Notes (Signed)
Bone marrow this Friday can we check Patient Care Team: Monico Blitz, [name], MD (Inactive) (Gastroenterology) Delice Bison, Charlestine Massed, NP as Nurse Practitioner (Hematology and Oncology) Nicholas Lose, MD as Consulting Physician (Hematology and Oncology) Eppie Gibson, MD as Attending Physician (Radiation Oncology) [name], MD as Consulting Physician (General Surgery)  DIAGNOSIS:    ICD-10-CM   1. 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     SUMMARY OF ONCOLOGIC HISTORY: Oncology History  Ductal carcinoma in situ (DCIS) of left breast  12/31/2015 Initial Diagnosis   Left breast [name] biopsy: DCIS with calcifications ER 90%, PR -50%, high-grade   01/30/2016 Surgery   Left Lumpectomy: No residual DCIS, fibrocystic changes with UDH   02/27/2016 - 04/16/2016 Radiation Therapy   Adjuvant radiation therapy in [facility]   04/28/2016 -  Anti-estrogen oral therapy   Tamoxifen 20 mg daily 5 years   01/19/2019 Breast MRI   Breast MRI showed thickening and focal enhancement in the upper-outer left areola, a 1.3cm enhancement 3-72m from the lumpectomy site. Biopsy showed hyalinized intraductal papilloma, usual ductal hyperplasia, and no evidence of malignancy.     CHIEF COMPLIANT: Follow-up of left breast DCIS  INTERVAL HISTORY: VDenyla Corteseis a 60y.o. with above-mentioned history of left breast DCIS treated with lumpectomy, radiation, and who is currently on anti-estrogen therapy with tamoxifen. Mammogram on 12/19/19 showed no evidence of malignancy bilaterally. She also has a history of anemia. She presents to the clinic todayto review the pathology.   She had infection/abscess in the breast which required drainage last year. She continues to experience fatigue symptoms.  ALLERGIES:  has No Known Allergies.  MEDICATIONS:  Current Outpatient Medications  Medication Sig Dispense Refill   ferrous sulfate 325 (65  FE) MG EC tablet Take 325 mg by mouth daily.      glimepiride (AMARYL) 2 MG tablet Take 2 mg by mouth daily.     metFORMIN (GLUCOPHAGE-XR) 500 MG 24 hr tablet Take 500 mg by mouth at bedtime.   1   Multiple Vitamins-Minerals (MULTIVITAMIN WITH MINERALS) tablet Take 1 tablet by mouth daily.     oxyCODONE (OXY IR/ROXICODONE) 5 MG immediate release tablet Take 1 tablet (5 mg total) by mouth every 4 (four) hours as needed for severe pain. 10 tablet 0   pravastatin (PRAVACHOL) 40 MG tablet Take 40 mg by mouth at bedtime.   3   tamoxifen (NOLVADEX) 20 MG tablet TAKE 1 TABLET BY MOUTH EVERYDAY AT BEDTIME 90 tablet 3   No current facility-administered medications for this visit.    PHYSICAL EXAMINATION: ECOG PERFORMANCE STATUS: 1 - Symptomatic but completely ambulatory  Vitals:   02/20/20 0946  BP: (!) 173/72  Pulse: 100  Resp: 18  Temp: 97.7 F (36.5 C)  SpO2: 100%   Filed Weights   02/20/20 0946  Weight: 169 lb 3.2 oz (76.7 kg)    BREAST: No palpable masses or nodules in either right or left breasts. No palpable axillary supraclavicular or infraclavicular adenopathy no breast tenderness or nipple discharge. (exam performed in the presence of a chaperone)  LABORATORY DATA:  I have reviewed the data as listed CMP Latest Ref Rng & Units 02/23/2019 03/21/2018 12/29/2017  Glucose 70 - 99 mg/dL 138(H) 125(H) 118(H)  BUN 6 - 20 mg/dL _0 Creatinine 0.44 - 1.00 mg/dL 1.07(H) 1.18(H) 1.43(H)  Sodium 135 - 145 mmol/L 142 142 142  Potassium 3.5 - 5.1  mmol/L 4.1 3.1(L) 3.7  Chloride 98 - 111 mmol/L 105 101 105  CO2 22 - 32 mmol/L _0 Calcium 8.9 - 10.3 mg/dL 9.5 9.8 9.8  Total Protein 6.5 - 8.1 g/dL - 7.5 7.3  Total Bilirubin 0.3 - 1.2 mg/dL - 0.9 0.3  Alkaline Phos 38 - 126 U/L - 45 38  AST 15 - 41 U/L - 21 17  ALT 0 - 44 U/L - 21 15    Lab Results  Component Value Date   WBC 2.9 (L) 02/20/2020   HGB 10.6 (L) 02/20/2020   HCT 33.5 (L) 02/20/2020   MCV 71.9 (L)  02/20/2020   PLT 214 02/20/2020   NEUTROABS 1.3 (L) 02/20/2020    ASSESSMENT & PLAN:  Ductal carcinoma in situ (DCIS) of left breast Left breast [name] biopsy 12/31/2015: 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 Breast conserving surgery: 01/30/16: ADH 2. Followed by adjuvant radiation therapy 3. Followed by antiestrogen therapy with tamoxifen 5 yearsstarted 04/28/2016 4. left lumpectomy (nipple discharge) 03/01/2019: Hyalinized intraductal papilloma  Tamoxifen toxicities: 1.Occasional hot flashes 2.Myalgias  Breast cancer surveillance: 1.Breast exam11/24/2020: Benign 2.mammogram11/03/2018:Benignbreast density category C 3.breast MRI 02/06/2019: 1.3 cm area of enhancement medial aspect of left nipple areolar complex, stable postoperative seroma 5.6 cm Biopsy 02/08/2019: Hyalinized intraductal papilloma, UDH 03/01/2019: Left lumpectomy: Hyalinized intraductal papilloma, no malignancy identified  Microcytic anemia and leukopenia: Previous hemoglobin after pheresis showed normal adult hemoglobin.  Iron studies did not show any significant iron deficiency.  But I suspect that there may be a subtle iron deficiency.  Given the fact that her WBC count is also lower, I recommended that we do a bone marrow biopsy.  We will perform this test this Friday and I will see her a week later to discuss results.   No orders of the defined types were placed in this encounter.  The patient has a good understanding of the overall plan. she agrees with it. she will call with any problems that may develop before the next visit here.  Total time spent: 20 mins including face to face time and time spent for planning, charting and coordination of care  Nicholas Lose, MD 02/20/2020  I, Cloyde Reams Dorshimer, am acting as scribe for Dr. Nicholas Lose.  I have reviewed the above documentation for accuracy and completeness, and I agree with the above.

## 2020-02-20 ENCOUNTER — Inpatient Hospital Stay (HOSPITAL_BASED_OUTPATIENT_CLINIC_OR_DEPARTMENT_OTHER): Payer: 59 | Admitting: Hematology and Oncology

## 2020-02-20 ENCOUNTER — Other Ambulatory Visit: Payer: Self-pay | Admitting: *Deleted

## 2020-02-20 ENCOUNTER — Other Ambulatory Visit: Payer: Self-pay

## 2020-02-20 ENCOUNTER — Inpatient Hospital Stay: Payer: 59 | Attending: Hematology and Oncology

## 2020-02-20 DIAGNOSIS — D0512 Intraductal carcinoma in situ of left breast: Secondary | ICD-10-CM

## 2020-02-20 DIAGNOSIS — M791 Myalgia, unspecified site: Secondary | ICD-10-CM | POA: Diagnosis not present

## 2020-02-20 DIAGNOSIS — D72819 Decreased white blood cell count, unspecified: Secondary | ICD-10-CM | POA: Insufficient documentation

## 2020-02-20 DIAGNOSIS — Z170 Estrogen receptor positive status [ER+]: Secondary | ICD-10-CM | POA: Diagnosis not present

## 2020-02-20 DIAGNOSIS — Z79811 Long term (current) use of aromatase inhibitors: Secondary | ICD-10-CM | POA: Diagnosis not present

## 2020-02-20 DIAGNOSIS — D509 Iron deficiency anemia, unspecified: Secondary | ICD-10-CM | POA: Diagnosis not present

## 2020-02-20 DIAGNOSIS — Z923 Personal history of irradiation: Secondary | ICD-10-CM | POA: Insufficient documentation

## 2020-02-20 DIAGNOSIS — R232 Flushing: Secondary | ICD-10-CM | POA: Insufficient documentation

## 2020-02-20 LAB — CMP (CANCER CENTER ONLY)
ALT: 17 U/L (ref 0–44)
AST: 17 U/L (ref 15–41)
Albumin: 4.1 g/dL (ref 3.5–5.0)
Alkaline Phosphatase: 45 U/L (ref 38–126)
Anion gap: 7 (ref 5–15)
BUN: 12 mg/dL (ref 8–23)
CO2: 26 mmol/L (ref 22–32)
Calcium: 9.3 mg/dL (ref 8.9–10.3)
Chloride: 109 mmol/L (ref 98–111)
Creatinine: 1.1 mg/dL — ABNORMAL HIGH (ref 0.44–1.00)
GFR, Estimated: 57 mL/min — ABNORMAL LOW (ref >60)
Glucose, Bld: 126 mg/dL — ABNORMAL HIGH (ref 70–99)
Potassium: 3.7 mmol/L (ref 3.5–5.1)
Sodium: 142 mmol/L (ref 135–145)
Total Bilirubin: 0.5 mg/dL (ref 0.3–1.2)
Total Protein: 7.1 g/dL (ref 6.5–8.1)

## 2020-02-20 LAB — CBC WITH DIFFERENTIAL (CANCER CENTER ONLY)
Abs Immature Granulocytes: 0.01 10*3/uL (ref 0.00–0.07)
Basophils Absolute: 0 10*3/uL (ref 0.0–0.1)
Basophils Relative: 1 %
Eosinophils Absolute: 0.1 10*3/uL (ref 0.0–0.5)
Eosinophils Relative: 3 %
HCT: 33.5 % — ABNORMAL LOW (ref 36.0–46.0)
Hemoglobin: 10.6 g/dL — ABNORMAL LOW (ref 12.0–15.0)
Immature Granulocytes: 0 %
Lymphocytes Relative: 40 %
Lymphs Abs: 1.2 10*3/uL (ref 0.7–4.0)
MCH: 22.7 pg — ABNORMAL LOW (ref 26.0–34.0)
MCHC: 31.6 g/dL (ref 30.0–36.0)
MCV: 71.9 fL — ABNORMAL LOW (ref 80.0–100.0)
Monocytes Absolute: 0.3 10*3/uL (ref 0.1–1.0)
Monocytes Relative: 9 %
Neutro Abs: 1.3 10*3/uL — ABNORMAL LOW (ref 1.7–7.7)
Neutrophils Relative %: 47 %
Platelet Count: 214 10*3/uL (ref 150–400)
RBC: 4.66 MIL/uL (ref 3.87–5.11)
RDW: 15.3 % (ref 11.5–15.5)
WBC Count: 2.9 10*3/uL — ABNORMAL LOW (ref 4.0–10.5)
nRBC: 0 % (ref 0.0–0.2)

## 2020-02-20 LAB — IRON AND TIBC
Iron: 74 ug/dL (ref 41–142)
Saturation Ratios: 20 % — ABNORMAL LOW (ref 21–57)
TIBC: 374 ug/dL (ref 236–444)
UIBC: 300 ug/dL (ref 120–384)

## 2020-02-20 LAB — FERRITIN: Ferritin: 317 ng/mL — ABNORMAL HIGH (ref 11–307)

## 2020-02-20 NOTE — Assessment & Plan Note (Addendum)
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 Breast conserving surgery: 01/30/16: ADH 2. Followed by adjuvant radiation therapy 3. Followed by antiestrogen therapy with tamoxifen 5 yearsstarted 04/28/2016 4. left lumpectomy (nipple discharge) 03/01/2019: Hyalinized intraductal papilloma  Tamoxifen toxicities: 1.Occasional hot flashes 2.Myalgias  Breast cancer surveillance: 1.Breast exam11/24/2020: Benign 2.mammogram11/03/2018:Benignbreast density category C 3.breast MRI 02/06/2019: 1.3 cm area of enhancement medial aspect of left nipple areolar complex, stable postoperative seroma 5.6 cm Biopsy 02/08/2019: Hyalinized intraductal papilloma, UDH 03/01/2019: Left lumpectomy: Hyalinized intraductal papilloma, no malignancy identified  Return to clinic in 1 year for follow-up

## 2020-02-22 ENCOUNTER — Telehealth: Payer: Self-pay | Admitting: Hematology and Oncology

## 2020-02-22 NOTE — Telephone Encounter (Signed)
Scheduled per 1/11 los. Called and spoke with pt, confirmed 1/21 appt

## 2020-02-23 ENCOUNTER — Inpatient Hospital Stay: Payer: 59 | Admitting: Hematology and Oncology

## 2020-02-23 ENCOUNTER — Other Ambulatory Visit: Payer: Self-pay | Admitting: Hematology and Oncology

## 2020-02-23 ENCOUNTER — Inpatient Hospital Stay: Payer: 59

## 2020-02-23 ENCOUNTER — Other Ambulatory Visit: Payer: Self-pay

## 2020-02-23 DIAGNOSIS — D0512 Intraductal carcinoma in situ of left breast: Secondary | ICD-10-CM

## 2020-02-23 DIAGNOSIS — D649 Anemia, unspecified: Secondary | ICD-10-CM

## 2020-02-23 LAB — CBC WITH DIFFERENTIAL (CANCER CENTER ONLY)
Abs Immature Granulocytes: 0.02 10*3/uL (ref 0.00–0.07)
Basophils Absolute: 0 10*3/uL (ref 0.0–0.1)
Basophils Relative: 1 %
Eosinophils Absolute: 0.1 10*3/uL (ref 0.0–0.5)
Eosinophils Relative: 1 %
HCT: 33.3 % — ABNORMAL LOW (ref 36.0–46.0)
Hemoglobin: 10.6 g/dL — ABNORMAL LOW (ref 12.0–15.0)
Immature Granulocytes: 1 %
Lymphocytes Relative: 27 %
Lymphs Abs: 1.1 10*3/uL (ref 0.7–4.0)
MCH: 23 pg — ABNORMAL LOW (ref 26.0–34.0)
MCHC: 31.8 g/dL (ref 30.0–36.0)
MCV: 72.4 fL — ABNORMAL LOW (ref 80.0–100.0)
Monocytes Absolute: 0.3 10*3/uL (ref 0.1–1.0)
Monocytes Relative: 8 %
Neutro Abs: 2.5 10*3/uL (ref 1.7–7.7)
Neutrophils Relative %: 62 %
Platelet Count: 198 10*3/uL (ref 150–400)
RBC: 4.6 MIL/uL (ref 3.87–5.11)
RDW: 15.2 % (ref 11.5–15.5)
WBC Count: 4.1 10*3/uL (ref 4.0–10.5)
nRBC: 0 % (ref 0.0–0.2)

## 2020-02-23 MED ORDER — LIDOCAINE HCL 2 % IJ SOLN
INTRAMUSCULAR | Status: AC
  Filled 2020-02-23: qty 20

## 2020-02-26 LAB — SURGICAL PATHOLOGY

## 2020-02-27 ENCOUNTER — Telehealth: Payer: Self-pay | Admitting: Hematology and Oncology

## 2020-02-27 NOTE — Assessment & Plan Note (Addendum)
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 Breast conserving surgery: 01/30/16: ADH 2. Followed by adjuvant radiation therapy 3. Followed by antiestrogen therapy with tamoxifen 5 yearsstarted 04/28/2016 4. left lumpectomy (nipple discharge) 03/01/2019: Hyalinized intraductal papilloma  Tamoxifen toxicities: 1.Occasional hot flashes 2.Myalgias  Breast cancer surveillance: 1.Breast exam11/24/2020: Benign 2.mammogram11/03/2018:Benignbreast density category C 3.breast MRI 02/06/2019: 1.3 cm area of enhancement medial aspect of left nipple areolar complex, stable postoperative seroma 5.6 cm Biopsy 02/08/2019: Hyalinized intraductal papilloma, UDH 03/01/2019: Left lumpectomy: Hyalinized intraductal papilloma, no malignancy identified

## 2020-02-27 NOTE — Assessment & Plan Note (Signed)
Microcytic anemia and leukopenia: Hemoglobin electrophoresis and iron studies were normal Bone marrow biopsy 02/23/2020: Slightly hypercellular bone marrow 50 to 60% cellularity with trilineage hematopoiesis, several lymphoid aggregates present but flow cytometry is negative.  No increase in blasts.  Findings are nonspecific. Cytogenetics and FISH are pending storage iron: Increased, Ring sideroblasts absent  Counseling: Given the normal bone marrow biopsy, I attribute her leukopenia to ethnicity related. There is plenty of iron so therefore there is no role for iron infusions. We will monitor her labs.

## 2020-02-27 NOTE — Telephone Encounter (Signed)
No 11/4 los, no changes made to pt schedule  °

## 2020-02-29 NOTE — Progress Notes (Signed)
HEMATOLOGY-ONCOLOGY MYCHART VIDEO VISIT PROGRESS NOTE  I connected with Kerri Shah on 03/01/2020 at 12:30 PM EST by MyChart video conference and verified that I am speaking with the correct person using two identifiers.  I discussed the limitations, risks, security and privacy concerns of performing an evaluation and management service by MyChart and the availability of in person appointments.  I also discussed with the patient that there may be a patient responsible charge related to this service. The patient expressed understanding and agreed to proceed.  Patient's Location: Home Physician Location: Clinic  CHIEF COMPLIANT: Follow-up s//p bone marrow biopsy   INTERVAL HISTORY: Kerri Shah is a 60 y.o. female with above-mentioned history of left breast DCIS treated with lumpectomy, radiation, and who is currently on anti-estrogen therapy with tamoxifen.She also has a history of anemia and underwent a bone marrow biopsy on 02/23/20. She presents over MyChart today to discuss the pathology report.  She has done quite well from the bone marrow biopsy without any pain or discomfort.  Oncology History  Ductal carcinoma in situ (DCIS) of left breast  12/31/2015 Initial Diagnosis   Left breast [name] biopsy: DCIS with calcifications ER 90%, PR -50%, high-grade   01/30/2016 Surgery   Left Lumpectomy: No residual DCIS, fibrocystic changes with UDH   02/27/2016 - 04/16/2016 Radiation Therapy   Adjuvant radiation therapy in [facility]   04/28/2016 -  Anti-estrogen oral therapy   Tamoxifen 20 mg daily 5 years   01/19/2019 Breast MRI   Breast MRI showed thickening and focal enhancement in the upper-outer left areola, a 1.3cm enhancement 3-32mm from the lumpectomy site. Biopsy showed hyalinized intraductal papilloma, usual ductal hyperplasia, and no evidence of malignancy.     Observations/Objective:  There were no vitals filed for this visit. There is no height or weight on file to  calculate BMI.  I have reviewed the data as listed CMP Latest Ref Rng & Units 02/20/2020 02/23/2019 03/21/2018  Glucose 70 - 99 mg/dL 126(H) 138(H) 125(H)  BUN 8 - 23 mg/dL $Remove'12 17 17  'aAhAQvH$ Creatinine 0.44 - 1.00 mg/dL 1.10(H) 1.07(H) 1.18(H)  Sodium 135 - 145 mmol/L 142 142 142  Potassium 3.5 - 5.1 mmol/L 3.7 4.1 3.1(L)  Chloride 98 - 111 mmol/L 109 105 101  CO2 22 - 32 mmol/L $RemoveB'26 27 29  'jiRGMFmM$ Calcium 8.9 - 10.3 mg/dL 9.3 9.5 9.8  Total Protein 6.5 - 8.1 g/dL 7.1 - 7.5  Total Bilirubin 0.3 - 1.2 mg/dL 0.5 - 0.9  Alkaline Phos 38 - 126 U/L 45 - 45  AST 15 - 41 U/L 17 - 21  ALT 0 - 44 U/L 17 - 21    Lab Results  Component Value Date   WBC 4.1 02/23/2020   HGB 10.6 (L) 02/23/2020   HCT 33.3 (L) 02/23/2020   MCV 72.4 (L) 02/23/2020   PLT 198 02/23/2020   NEUTROABS 2.5 02/23/2020      Assessment Plan:  Ductal carcinoma in situ (DCIS) of left breast Left breast [name] biopsy 12/31/2015: 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 Breast conserving surgery: 01/30/16: ADH 2. Followed by adjuvant radiation therapy 3. Followed by antiestrogen therapy with tamoxifen 5 yearsstarted 04/28/2016 4. left lumpectomy (nipple discharge) 03/01/2019: Hyalinized intraductal papilloma  Tamoxifen toxicities: 1.Occasional hot flashes 2.Myalgias  Breast cancer surveillance: 1.Breast exam11/24/2020: Benign 2.mammogram11/03/2018:Benignbreast density category C 3.breast MRI 02/06/2019: 1.3 cm area of enhancement medial aspect of left nipple areolar complex, stable postoperative seroma 5.6 cm Biopsy 02/08/2019: Hyalinized  intraductal papilloma, UDH 03/01/2019: Left lumpectomy: Hyalinized intraductal papilloma, no malignancy identified   Leukopenia Microcytic anemia and leukopenia: Hemoglobin electrophoresis and iron studies were normal Bone marrow biopsy 02/23/2020: Slightly hypercellular bone marrow 50 to 60% cellularity with trilineage hematopoiesis, several  lymphoid aggregates present but flow cytometry is negative.  No increase in blasts.  Findings are nonspecific. Cytogenetics and FISH are pending storage iron: Increased, Ring sideroblasts absent  Counseling: Given the normal bone marrow biopsy, I attribute her leukopenia to ethnicity related. There is plenty of iron so therefore there is no role for iron infusions. We will monitor her labs.  Return to clinic in 4 months with labs and follow-up  I discussed the assessment and treatment plan with the patient. The patient was provided an opportunity to ask questions and all were answered. The patient agreed with the plan and demonstrated an understanding of the instructions. The patient was advised to call back or seek an in-person evaluation if the symptoms worsen or if the condition fails to improve as anticipated.   I provided 20 minutes of face-to-face MyChart video visit time during this encounter.    Rulon Eisenmenger, MD 03/01/2020   I, Molly Dorshimer, am acting as scribe for Nicholas Lose, MD.  I have reviewed the above documentation for accuracy and completeness, and I agree with the above.

## 2020-03-01 ENCOUNTER — Encounter (HOSPITAL_COMMUNITY): Payer: Self-pay | Admitting: Hematology and Oncology

## 2020-03-01 ENCOUNTER — Inpatient Hospital Stay (HOSPITAL_BASED_OUTPATIENT_CLINIC_OR_DEPARTMENT_OTHER): Payer: 59 | Admitting: Hematology and Oncology

## 2020-03-01 DIAGNOSIS — D72819 Decreased white blood cell count, unspecified: Secondary | ICD-10-CM | POA: Diagnosis not present

## 2020-03-01 DIAGNOSIS — D0512 Intraductal carcinoma in situ of left breast: Secondary | ICD-10-CM | POA: Diagnosis not present

## 2020-03-01 DIAGNOSIS — D649 Anemia, unspecified: Secondary | ICD-10-CM

## 2020-03-14 ENCOUNTER — Telehealth: Payer: Self-pay | Admitting: Hematology and Oncology

## 2020-03-14 NOTE — Telephone Encounter (Signed)
Left message with upcoming appointment. Gave option to call back to reschedule if needed. 

## 2020-07-01 ENCOUNTER — Other Ambulatory Visit: Payer: Self-pay

## 2020-07-01 ENCOUNTER — Inpatient Hospital Stay: Payer: 59

## 2020-07-01 ENCOUNTER — Inpatient Hospital Stay: Payer: 59 | Attending: Hematology and Oncology | Admitting: Hematology and Oncology

## 2020-07-01 DIAGNOSIS — D72819 Decreased white blood cell count, unspecified: Secondary | ICD-10-CM | POA: Insufficient documentation

## 2020-07-01 DIAGNOSIS — D0512 Intraductal carcinoma in situ of left breast: Secondary | ICD-10-CM | POA: Diagnosis present

## 2020-07-01 DIAGNOSIS — D649 Anemia, unspecified: Secondary | ICD-10-CM | POA: Insufficient documentation

## 2020-07-01 DIAGNOSIS — Z79810 Long term (current) use of selective estrogen receptor modulators (SERMs): Secondary | ICD-10-CM | POA: Insufficient documentation

## 2020-07-01 DIAGNOSIS — Z7984 Long term (current) use of oral hypoglycemic drugs: Secondary | ICD-10-CM | POA: Diagnosis not present

## 2020-07-01 DIAGNOSIS — R232 Flushing: Secondary | ICD-10-CM | POA: Insufficient documentation

## 2020-07-01 DIAGNOSIS — Z170 Estrogen receptor positive status [ER+]: Secondary | ICD-10-CM | POA: Diagnosis not present

## 2020-07-01 DIAGNOSIS — Z923 Personal history of irradiation: Secondary | ICD-10-CM | POA: Diagnosis not present

## 2020-07-01 LAB — IRON AND TIBC
Iron: 62 ug/dL (ref 41–142)
Saturation Ratios: 16 % — ABNORMAL LOW (ref 21–57)
TIBC: 395 ug/dL (ref 236–444)
UIBC: 333 ug/dL (ref 120–384)

## 2020-07-01 LAB — CBC WITH DIFFERENTIAL (CANCER CENTER ONLY)
Abs Immature Granulocytes: 0.03 10*3/uL (ref 0.00–0.07)
Basophils Absolute: 0.1 10*3/uL (ref 0.0–0.1)
Basophils Relative: 1 %
Eosinophils Absolute: 0.1 10*3/uL (ref 0.0–0.5)
Eosinophils Relative: 2 %
HCT: 34.7 % — ABNORMAL LOW (ref 36.0–46.0)
Hemoglobin: 10.9 g/dL — ABNORMAL LOW (ref 12.0–15.0)
Immature Granulocytes: 1 %
Lymphocytes Relative: 37 %
Lymphs Abs: 1.5 10*3/uL (ref 0.7–4.0)
MCH: 22.8 pg — ABNORMAL LOW (ref 26.0–34.0)
MCHC: 31.4 g/dL (ref 30.0–36.0)
MCV: 72.6 fL — ABNORMAL LOW (ref 80.0–100.0)
Monocytes Absolute: 0.3 10*3/uL (ref 0.1–1.0)
Monocytes Relative: 8 %
Neutro Abs: 2.1 10*3/uL (ref 1.7–7.7)
Neutrophils Relative %: 51 %
Platelet Count: 215 10*3/uL (ref 150–400)
RBC: 4.78 MIL/uL (ref 3.87–5.11)
RDW: 15.7 % — ABNORMAL HIGH (ref 11.5–15.5)
WBC Count: 4 10*3/uL (ref 4.0–10.5)
nRBC: 0 % (ref 0.0–0.2)

## 2020-07-01 LAB — CMP (CANCER CENTER ONLY)
ALT: 13 U/L (ref 0–44)
AST: 17 U/L (ref 15–41)
Albumin: 4.1 g/dL (ref 3.5–5.0)
Alkaline Phosphatase: 50 U/L (ref 38–126)
Anion gap: 7 (ref 5–15)
BUN: 12 mg/dL (ref 8–23)
CO2: 28 mmol/L (ref 22–32)
Calcium: 9.5 mg/dL (ref 8.9–10.3)
Chloride: 108 mmol/L (ref 98–111)
Creatinine: 1.03 mg/dL — ABNORMAL HIGH (ref 0.44–1.00)
GFR, Estimated: >60 mL/min (ref >60)
Glucose, Bld: 101 mg/dL — ABNORMAL HIGH (ref 70–99)
Potassium: 3.9 mmol/L (ref 3.5–5.1)
Sodium: 143 mmol/L (ref 135–145)
Total Bilirubin: 0.3 mg/dL (ref 0.3–1.2)
Total Protein: 7.2 g/dL (ref 6.5–8.1)

## 2020-07-01 LAB — FERRITIN: Ferritin: 367 ng/mL — ABNORMAL HIGH (ref 11–307)

## 2020-07-01 LAB — FOLATE: Folate: 22.5 ng/mL (ref >5.9)

## 2020-07-01 LAB — VITAMIN B12: Vitamin B-12: 650 pg/mL (ref 180–914)

## 2020-07-01 MED ORDER — TAMOXIFEN CITRATE 20 MG PO TABS: ORAL_TABLET | ORAL | 3 refills | Status: DC

## 2020-07-01 NOTE — Assessment & Plan Note (Signed)
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 Breast conserving surgery: 01/30/16: ADH 2. Followed by adjuvant radiation therapy 3. Followed by antiestrogen therapy with tamoxifen 5 yearsstarted 04/28/2016 4.left lumpectomy(nipple discharge)03/01/2019: Hyalinized intraductal papilloma  Tamoxifen toxicities: 1.Occasional hot flashes 2.Myalgias  Breast cancer surveillance: 1.Breast exam11/24/2020: Benign 2.mammogram11/03/2018:Benignbreast density category C 3.breast MRI 02/06/2019: 1.3 cm area of enhancement medial aspect of left nipple areolar complex, stable postoperative seroma 5.6 cm Biopsy 02/08/2019: Hyalinized intraductal papilloma, UDH 03/01/2019: Left lumpectomy: Hyalinized intraductal papilloma, no malignancy identified   Leukopenia Microcytic anemia andleukopenia: Hemoglobin electrophoresis and iron studies were normal Bone marrow biopsy 02/23/2020: Slightly hypercellular bone marrow 50 to 60% cellularity with trilineage hematopoiesis, several lymphoid aggregates present but flow cytometry is negative.  No increase in blasts.  Findings are nonspecific. Cytogenetics and FISH are pending storage iron: Increased, Ring sideroblasts absent  Counseling: Given the normal bone marrow biopsy, I attribute her leukopenia to ethnicity related. There is plenty of iron so therefore there is no role for iron infusions. We will monitor her labs.  Return to clinic in 4 months with labs and follow-up

## 2020-07-01 NOTE — Progress Notes (Signed)
Patient Care Team: Monico Blitz, [name], MD (Inactive) (Gastroenterology) Delice Bison, Charlestine Massed, NP as Nurse Practitioner (Hematology and Oncology) Nicholas Lose, MD as Consulting Physician (Hematology and Oncology) Eppie Gibson, MD as Attending Physician (Radiation Oncology) [name], MD as Consulting Physician (General Surgery)  DIAGNOSIS:    ICD-10-CM   1. 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     SUMMARY OF ONCOLOGIC HISTORY: Oncology History  Ductal carcinoma in situ (DCIS) of left breast  12/31/2015 Initial Diagnosis   Left breast [name] biopsy: DCIS with calcifications ER 90%, PR -50%, high-grade   01/30/2016 Surgery   Left Lumpectomy: No residual DCIS, fibrocystic changes with UDH   02/27/2016 - 04/16/2016 Radiation Therapy   Adjuvant radiation therapy in [facility]   04/28/2016 -  Anti-estrogen oral therapy   Tamoxifen 20 mg daily 5 years   01/19/2019 Breast MRI   Breast MRI showed thickening and focal enhancement in the upper-outer left areola, a 1.3cm enhancement 3-26mm from the lumpectomy site. Biopsy showed hyalinized intraductal papilloma, usual ductal hyperplasia, and no evidence of malignancy.     CHIEF COMPLIANT: Follow-up of left breast DCIS, anemia, and leukopenia  INTERVAL HISTORY: Kerri Shah is a 60 y.o. with above-mentioned history of left breast DCIS treated with lumpectomy, radiation, and who is currently on anti-estrogen therapy with tamoxifen.She also has a history of anemia and leukopenia. She presents to the clinic todayfor follow-up.  She does not complain of any new health issues or concerns.  She is currently taking tamoxifen and appears to be tolerating it very well.  Denies any lumps or nodules in the breast.  ALLERGIES:  has No Known Allergies.  MEDICATIONS:  Current Outpatient Medications  Medication Sig Dispense Refill  . ferrous sulfate 325 (65 FE) MG EC  tablet Take 325 mg by mouth daily.     Marland Kitchen glimepiride (AMARYL) 2 MG tablet Take 2 mg by mouth daily.    . metFORMIN (GLUCOPHAGE-XR) 500 MG 24 hr tablet Take 500 mg by mouth at bedtime.   1  . Multiple Vitamins-Minerals (MULTIVITAMIN WITH MINERALS) tablet Take 1 tablet by mouth daily.    Marland Kitchen oxyCODONE (OXY IR/ROXICODONE) 5 MG immediate release tablet Take 1 tablet (5 mg total) by mouth every 4 (four) hours as needed for severe pain. 10 tablet 0  . pravastatin (PRAVACHOL) 40 MG tablet Take 40 mg by mouth at bedtime.   3  . tamoxifen (NOLVADEX) 20 MG tablet TAKE 1 TABLET BY MOUTH EVERYDAY AT BEDTIME 90 tablet 3   No current facility-administered medications for this visit.    PHYSICAL EXAMINATION: ECOG PERFORMANCE STATUS: 1 - Symptomatic but completely ambulatory  Vitals:   07/01/20 1158  BP: (!) 163/71  Pulse: 85  Resp: 18  Temp: 97.9 F (36.6 C)  SpO2: 100%   Filed Weights   07/01/20 1158  Weight: 169 lb 3.2 oz (76.7 kg)    BREAST: No palpable masses or nodules in either right or left breasts. No palpable axillary supraclavicular or infraclavicular adenopathy no breast tenderness or nipple discharge. (exam performed in the presence of a chaperone)  LABORATORY DATA:  I have reviewed the data as listed CMP Latest Ref Rng & Units 02/20/2020 02/23/2019 03/21/2018  Glucose 70 - 99 mg/dL 126(H) 138(H) 125(H)  BUN 8 - 23 mg/dL $Remove'12 17 17  'BmHxuIo$ Creatinine 0.44 - 1.00 mg/dL 1.10(H) 1.07(H) 1.18(H)  Sodium 135 - 145 mmol/L 142 142 142  Potassium  3.5 - 5.1 mmol/L 3.7 4.1 3.1(L)  Chloride 98 - 111 mmol/L 109 105 101  CO2 22 - 32 mmol/L $RemoveB'26 27 29  'jgAxmaWr$ Calcium 8.9 - 10.3 mg/dL 9.3 9.5 9.8  Total Protein 6.5 - 8.1 g/dL 7.1 - 7.5  Total Bilirubin 0.3 - 1.2 mg/dL 0.5 - 0.9  Alkaline Phos 38 - 126 U/L 45 - 45  AST 15 - 41 U/L 17 - 21  ALT 0 - 44 U/L 17 - 21    Lab Results  Component Value Date   WBC 4.0 07/01/2020   HGB 10.9 (L) 07/01/2020   HCT 34.7 (L) 07/01/2020   MCV 72.6 (L) 07/01/2020   PLT  215 07/01/2020   NEUTROABS 2.1 07/01/2020    ASSESSMENT & PLAN:  Ductal carcinoma in situ (DCIS) of left breast Left breast [name] biopsy 12/31/2015: 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 Breast conserving surgery: 01/30/16: ADH 2. Followed by adjuvant radiation therapy 3. Followed by antiestrogen therapy with tamoxifen 5 yearsstarted 04/28/2016 4.left lumpectomy(nipple discharge)03/01/2019: Hyalinized intraductal papilloma  Tamoxifen toxicities: 1.Occasional hot flashes 2.Myalgias She will complete 5 years of tamoxifen therapy by March 2023.  At that time we will stop it.  Breast cancer surveillance: 1.Breast exam11/24/2020: Benign 2.mammogram11/10/2019:Benignbreast density category C 3.breast MRI 02/06/2019: 1.3 cm area of enhancement medial aspect of left nipple areolar complex, stable postoperative seroma 5.6 cm Biopsy 02/08/2019: Hyalinized intraductal papilloma, UDH 03/01/2019: Left lumpectomy: Hyalinized intraductal papilloma, no malignancy identified   Leukopenia: Resolved Microcytic anemia andleukopenia: Hemoglobin electrophoresis and iron studies were normal Bone marrow biopsy 02/23/2020: Slightly hypercellular bone marrow 50 to 60% cellularity with trilineage hematopoiesis, several lymphoid aggregates present but flow cytometry is negative.  No increase in blasts.  Findings are nonspecific. Cytogenetics and FISH are negative storage iron: Increased, Ring sideroblasts absent  Counseling: Given the normal bone marrow biopsy, I attribute her leukopenia to ethnicity related. There is plenty of iron so therefore there is no role for iron infusions. We will monitor her labs.  Return to clinic in 1 year with labs and follow-up    No orders of the defined types were placed in this encounter.  The patient has a good understanding of the overall plan. she agrees with it. she will call with any problems that may develop  before the next visit here.  Total time spent: 30 mins including face to face time and time spent for planning, charting and coordination of care  Rulon Eisenmenger, MD, MPH 07/01/2020  I, Molly Dorshimer, am acting as scribe for Dr. Nicholas Lose.  I have reviewed the above documentation for accuracy and completeness, and I agree with the above.

## 2020-11-12 ENCOUNTER — Other Ambulatory Visit: Payer: Self-pay | Admitting: General Surgery

## 2020-11-12 DIAGNOSIS — Z9889 Other specified postprocedural states: Secondary | ICD-10-CM

## 2020-12-19 ENCOUNTER — Ambulatory Visit
Admission: RE | Admit: 2020-12-19 | Discharge: 2020-12-19 | Disposition: A | Discharge status: Home / Self Care | Payer: 59 | Source: Ambulatory Visit | Attending: General Surgery | Admitting: General Surgery

## 2020-12-19 DIAGNOSIS — Z9889 Other specified postprocedural states: Secondary | ICD-10-CM

## 2021-04-29 ENCOUNTER — Encounter: Payer: Self-pay | Admitting: Orthopaedic Surgery

## 2021-04-29 ENCOUNTER — Other Ambulatory Visit: Payer: Self-pay

## 2021-04-29 ENCOUNTER — Ambulatory Visit: Payer: 59 | Admitting: Orthopaedic Surgery

## 2021-04-29 ENCOUNTER — Ambulatory Visit (INDEPENDENT_AMBULATORY_CARE_PROVIDER_SITE_OTHER): Payer: 59

## 2021-04-29 VITALS — BP 169/94 | HR 98 | Ht 66.0 in | Wt 163.6 lb

## 2021-04-29 DIAGNOSIS — M79645 Pain in left finger(s): Secondary | ICD-10-CM

## 2021-04-29 NOTE — Addendum Note (Signed)
Addended by: Obie Dredge A on: 04/29/2021 10:52 AM ? ? Modules accepted: Orders ? ?

## 2021-04-29 NOTE — Progress Notes (Signed)
? ?Subjective:  ? ? Patient ID: Kerri Shah, female    DOB: Feb 24, 1960, 61 y.o.   MRN: [id number] ? ?HPI ?My finger bends wrong. ? ?She has had bending of the DIP joint of the long finger towards the little finger side.  She has no pain.  She has noticed this over the last few months.  She has no injury, no redness.  When she bends her finger, the finger straightens out normally.  She has no swelling. ? ? ? ?Review of Systems  ?Constitutional:  Positive for activity change.  ?Musculoskeletal:  Positive for arthralgias.  ?All other systems reviewed and are negative. ?For Review of Systems, all other systems reviewed and are negative. ? ?The following is a summary of the past history medically, past history surgically, known current medicines, social history and family history.  This information is gathered electronically by the computer from prior information and documentation.  I review this each visit and have found including this information at this point in the chart is beneficial and informative.  ? ?Past Medical History:  ?Diagnosis Date  ? Breast cancer ([place]) 2017  ? left breast lumpectomy  ? Breast discharge   ? lt bloody discharge x's 3 weeks   ? Breast mass   ? lt breast mass x's 3 weeks   ? Complication of anesthesia   ? DM (diabetes mellitus) ([place])   ? Fatty liver   ? GERD (gastroesophageal reflux disease)   ? History of kidney stones   ? Hypercholesteremia   ? Hypertension   ? Osteoarthritis   ? bilateral knees and Left shoulder  ? Personal history of radiation therapy 04/2016  ? radiation   ? PONV (postoperative nausea and vomiting)   ? Vitamin D deficiency   ? ? ?Past Surgical History:  ?Procedure Laterality Date  ? ABDOMINAL HYSTERECTOMY    ? BREAST LUMPECTOMY Left 2018  ? BREAST LUMPECTOMY Left 03/01/2019  ? BREAST LUMPECTOMY WITH RADIOACTIVE SEED LOCALIZATION Left 01/30/2016  ? Procedure: BREAST LUMPECTOMY WITH RADIOACTIVE SEED LOCALIZATION;  Surgeon: [name], MD;  Location: [facility];  Service: General;  Laterality: Left;  ? CHOLECYSTECTOMY N/A 07/30/2017  ? Procedure: LAPAROSCOPIC CHOLECYSTECTOMY;  Surgeon: Virl Cagey, MD;  Location: [facility];  Service: General;  Laterality: N/A;  ? COLONOSCOPY  12/23/10  ? internal hemorrhoids/polyps in the recto-sigmoid colon, hyperplastic, TCS IN 5 years  ? COLONOSCOPY N/A 06/29/2016  ? Dr. Oneida Alar: Nonthrombosed external hemorrhoids, 2 2 to 3 mm polyps removed, mild diverticulosis, internal hemorrhoids.  Path revealed tubular adenoma.  Next colonoscopy 5 years based on family history as well.  ? ESOPHAGOGASTRODUODENOSCOPY N/A 02/07/2018  ? Dr. Oneida Alar: Mild gastritis, pyloric stenosis status post dilation.  Placement of Givens capsule at the same time.  ? GIVENS CAPSULE STUDY N/A 02/07/2018  ? Dr. Oneida Alar: Normal  ? PARTIAL HYSTERECTOMY  2010  ? uterine fibroids  ? RADIOACTIVE SEED GUIDED EXCISIONAL BREAST BIOPSY Left 03/01/2019  ? Procedure: RADIOACTIVE SEED GUIDED EXCISIONAL LEFT BREAST BIOPSY;  Surgeon: [name], MD;  Location: [facility];  Service: General;  Laterality: Left;  ? TUBAL LIGATION    ? UPPER GASTROINTESTINAL ENDOSCOPY  Nov 2012  ? mild gastritis, patent esophageal ring s/p forceps dilation, chronic gastritis on path  ? ? ?Current Outpatient Medications on File Prior to Visit  ?Medication Sig Dispense Refill  ? ferrous sulfate 325 (65 FE) MG EC tablet Take 325 mg by mouth daily.     ?  glimepiride (AMARYL) 2 MG tablet Take 2 mg by mouth daily.    ? metFORMIN (GLUCOPHAGE-XR) 500 MG 24 hr tablet Take 500 mg by mouth at bedtime.   1  ? Multiple Vitamins-Minerals (MULTIVITAMIN WITH MINERALS) tablet Take 1 tablet by mouth daily.    ? pravastatin (PRAVACHOL) 40 MG tablet Take 40 mg by mouth at bedtime.   3  ? tamoxifen (NOLVADEX) 20 MG tablet TAKE 1 TABLET BY MOUTH EVERYDAY AT BEDTIME 90 tablet 3  ? ?No current facility-administered medications on file prior to visit.  ? ? ?Social History  ? ?Socioeconomic  History  ? Marital status: Married  ?  Spouse name: Not on file  ? Number of children: 2  ? Years of education: Not on file  ? Highest education level: Not on file  ?Occupational History  ? Occupation: Nsg Aide  ?Tobacco Use  ? Smoking status: Never  ? Smokeless tobacco: Never  ?Vaping Use  ? Vaping Use: Never used  ?Substance and Sexual Activity  ? Alcohol use: No  ? Drug use: No  ? Sexual activity: Yes  ?  Birth control/protection: Surgical  ?Other Topics Concern  ? Not on file  ?Social History Narrative  ? Not on file  ? ?Social Determinants of Health  ? ?Financial Resource Strain: Not on file  ?Food Insecurity: Not on file  ?Transportation Needs: Not on file  ?Physical Activity: Not on file  ?Stress: Not on file  ?Social Connections: Not on file  ?Intimate Partner Violence: Not on file  ? ? ?Family History  ?Problem Relation Age of Onset  ? Colon cancer Mother 64  ? Colon cancer Brother 37  ? Diabetes Brother   ? ? ?BP (!) 169/94   Pulse 98   Ht '5\' 6"'$  (1.676 m)   Wt 163 lb 9.6 oz (74.2 kg)   BMI 26.41 kg/m?  ? ?Body mass index is 26.41 kg/m?. ? ?   ?Objective:  ? Physical Exam ?Vitals and nursing note reviewed. Exam conducted with a chaperone present.  ?Constitutional:   ?   Appearance: She is well-developed.  ?HENT:  ?   Head: Normocephalic and atraumatic.  ?Eyes:  ?   Conjunctiva/sclera: Conjunctivae normal.  ?   Pupils: Pupils are equal, round, and reactive to light.  ?Cardiovascular:  ?   Rate and Rhythm: Normal rate and regular rhythm.  ?Pulmonary:  ?   Effort: Pulmonary effort is normal.  ?Abdominal:  ?   Palpations: Abdomen is soft.  ?Musculoskeletal:  ?     Hands: ? ?   Cervical back: Normal range of motion and neck supple.  ?Skin: ?   General: Skin is warm and dry.  ?Neurological:  ?   Mental Status: She is alert and oriented to person, place, and time.  ?   Cranial Nerves: No cranial nerve deficit.  ?   Motor: No abnormal muscle tone.  ?   Coordination: Coordination normal.  ?   Deep Tendon  Reflexes: Reflexes are normal and symmetric. Reflexes normal.  ?Psychiatric:     ?   Behavior: Behavior normal.     ?   Thought Content: Thought content normal.     ?   Judgment: Judgment normal.  ?X-rays were done of the long finger left, reported separately. ? ? ? ? ?   ?Assessment & Plan:  ? ?Encounter Diagnosis  ?Name Primary?  ? Pain of left middle finger Yes  ? ?I will have the hand surgeon see her  and get further evaluation.  She is agreeable to this. ? ?Call if any problem. ? ?Precautions discussed. ? ?Electronically Signed ?Sanjuana Kava, MD ?3/21/202310:26 AM ? ?

## 2021-05-06 ENCOUNTER — Other Ambulatory Visit: Payer: Self-pay

## 2021-05-06 ENCOUNTER — Ambulatory Visit: Payer: 59 | Admitting: Orthopedic Surgery

## 2021-05-06 ENCOUNTER — Encounter: Payer: Self-pay | Admitting: Orthopedic Surgery

## 2021-05-06 DIAGNOSIS — M151 Heberden's nodes (with arthropathy): Secondary | ICD-10-CM | POA: Diagnosis not present

## 2021-05-06 NOTE — Progress Notes (Signed)
? ?Office Visit Note ?  ?Patient: Kerri Shah           ?Date of Birth: December 13, 1960           ?MRN: [id number] ?Visit Date: 05/06/2021 ?             ?Requested by: Sanjuana Kava, MD ?[address] [place] ?[place],  [place] [address] ?PCP: Monico Blitz, MD ? ? ?Assessment & Plan: ?Visit Diagnoses:  ?1. 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 ? ?Plan: We reviewed her x-rays which show degenerative osteoarthritis at the middle finger DIP joint.  She has large osteophytes both dorsally from the distal phalanx involved from the middle phalanx.  We discussed conservative management with activity modification, oral anti-inflammatory medications, or bracing versus surgical management with a DIP arthrodesis.  It is not really bothersome to her now.  She would like to continue to observe her finger and will return to the office if anything changes. ? ?Follow-Up Instructions: No follow-ups on file.  ? ?Orders:  ?No orders of the defined types were placed in this encounter. ? ?No orders of the defined types were placed in this encounter. ? ? ? ? Procedures: ?No procedures performed ? ? ?Clinical Data: ?No additional findings. ? ? ?Subjective: ?Chief Complaint  ?Patient presents with  ? Left Middle Finger - New Patient (Initial Visit)  ? ? ?Is a 61 year old right-hand-dominant female who presents with painless deformity at the left middle finger DIP joint.  Its been continually worsening over the last year.  It is not painful although she will occasionally have some irritation with rubbing of the radial side of the DIP joint.  She thinks that maybe she had her finger smashed by the back of a commode lid many years ago.  She lost the fingernail on this finger at the time.  She denies any other injury.  She is able to use her hand without any difficulty.  She has no problem doing her normal daily activities. ? ? ?Review of Systems ? ? ?Objective: ?Vital Signs: BP 136/88 (BP  Location: Right Arm, Patient Position: Sitting, Cuff Size: Normal)   Pulse 80   Ht '5\' 6"'$  (1.676 m)   Wt 163 lb (73.9 kg)   SpO2 99%   BMI 26.31 kg/m?  ? ?Physical Exam ?Constitutional:   ?   Appearance: Normal appearance.  ?Cardiovascular:  ?   Rate and Rhythm: Normal rate.  ?   Pulses: Normal pulses.  ?Pulmonary:  ?   Effort: Pulmonary effort is normal.  ?Skin: ?   General: Skin is warm and dry.  ?   Capillary Refill: Capillary refill takes less than 2 seconds.  ?Neurological:  ?   Mental Status: She is alert.  ? ? ?Left Hand Exam  ? ?Tenderness  ?The patient is experiencing no tenderness.  ? ?Other  ?Erythema: absent ?Sensation: normal ?Pulse: present ? ?Comments:  No TTP at DIP joint with ulnar deviation at the joint.  Minimal pain w/ PROM of DIP joint.  Unable to passively correct the deviation.  ? ? ? ? ?Specialty Comments:  ?No specialty comments available. ? ?Imaging: ?No results found. ? ? ?PMFS History: ?Patient Active Problem List  ? Diagnosis Date Noted  ? 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 Colon adenomas 06/01/2019  ? Leukopenia 11/12/2017  ? Anemia 11/12/2017  ? Cholelithiasis 07/19/2017  ? Constipation 04/21/2017  ?  Abdominal pain, epigastric 04/21/2017  ? Encounter for screening colonoscopy   ? 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 Carcinoma of upper-outer quadrant of left breast in female, estrogen receptor positive ([place]) 01/21/2016  ? Hematochezia 12/01/2010  ? RUQ pain 12/01/2010  ? Family history of colon cancer 12/01/2010  ? Fatty liver 12/01/2010  ? ?Past Medical History:  ?Diagnosis Date  ? Breast cancer ([place]) 2017  ? left breast lumpectomy  ? Breast discharge   ? lt bloody discharge x's 3 weeks   ? Breast mass   ? lt breast mass x's 3 weeks   ? Complication of anesthesia   ? DM (diabetes mellitus) ([place])   ? Fatty liver   ? GERD (gastroesophageal reflux disease)   ? History of kidney stones   ?  Hypercholesteremia   ? Hypertension   ? Osteoarthritis   ? bilateral knees and Left shoulder  ? Personal history of radiation therapy 04/2016  ? radiation   ? PONV (postoperative nausea and vomiting)   ? Vitamin D deficiency   ?  ?Family History  ?Problem Relation Age of Onset  ? Colon cancer Mother 55  ? Colon cancer Brother 1  ? Diabetes Brother   ?  ?Past Surgical History:  ?Procedure Laterality Date  ? ABDOMINAL HYSTERECTOMY    ? BREAST LUMPECTOMY Left 2018  ? BREAST LUMPECTOMY Left 03/01/2019  ? BREAST LUMPECTOMY WITH RADIOACTIVE SEED LOCALIZATION Left 01/30/2016  ? Procedure: BREAST LUMPECTOMY WITH RADIOACTIVE SEED LOCALIZATION;  Surgeon: [name], MD;  Location: [facility];  Service: General;  Laterality: Left;  ? CHOLECYSTECTOMY N/A 07/30/2017  ? Procedure: LAPAROSCOPIC CHOLECYSTECTOMY;  Surgeon: Virl Cagey, MD;  Location: [facility];  Service: General;  Laterality: N/A;  ? COLONOSCOPY  12/23/10  ? internal hemorrhoids/polyps in the recto-sigmoid colon, hyperplastic, TCS IN 5 years  ? COLONOSCOPY N/A 06/29/2016  ? Dr. Oneida Alar: Nonthrombosed external hemorrhoids, 2 2 to 3 mm polyps removed, mild diverticulosis, internal hemorrhoids.  Path revealed tubular adenoma.  Next colonoscopy 5 years based on family history as well.  ? ESOPHAGOGASTRODUODENOSCOPY N/A 02/07/2018  ? Dr. Oneida Alar: Mild gastritis, pyloric stenosis status post dilation.  Placement of Givens capsule at the same time.  ? GIVENS CAPSULE STUDY N/A 02/07/2018  ? Dr. Oneida Alar: Normal  ? PARTIAL HYSTERECTOMY  2010  ? uterine fibroids  ? RADIOACTIVE SEED GUIDED EXCISIONAL BREAST BIOPSY Left 03/01/2019  ? Procedure: RADIOACTIVE SEED GUIDED EXCISIONAL LEFT BREAST BIOPSY;  Surgeon: [name], MD;  Location: [facility];  Service: General;  Laterality: Left;  ? TUBAL LIGATION    ? UPPER GASTROINTESTINAL ENDOSCOPY  Nov 2012  ? mild gastritis, patent esophageal ring s/p forceps dilation, chronic gastritis on path   ? ?Social History  ? ?Occupational History  ? Occupation: Nsg Aide  ?Tobacco Use  ? Smoking status: Never  ? Smokeless tobacco: Never  ?Vaping Use  ? Vaping Use: Never used  ?Substance and Sexual Activity  ? Alcohol use: No  ? Drug use: No  ? Sexual activity: Yes  ?  Birth control/protection: Surgical  ? ? ? ? ? ? ?

## 2021-06-16 ENCOUNTER — Encounter: Payer: Self-pay | Admitting: *Deleted

## 2021-06-23 NOTE — Progress Notes (Signed)
Patient Care Team: Monico Blitz, [name], MD (Inactive) (Gastroenterology) Delice Bison, Charlestine Massed, NP as Nurse Practitioner (Hematology and Oncology) Nicholas Lose, MD as Consulting Physician (Hematology and Oncology) Eppie Gibson, MD as Attending Physician (Radiation Oncology) [name], MD as Consulting Physician (General Surgery)  DIAGNOSIS:  Encounter Diagnoses  Name Primary?   Ductal carcinoma in situ (DCIS) of left breast    Leukopenia, unspecified type    Anemia, unspecified type Yes    SUMMARY OF ONCOLOGIC HISTORY: Oncology History  Ductal carcinoma in situ (DCIS) of left breast  12/31/2015 Initial Diagnosis   Left breast [name] biopsy: DCIS with calcifications ER 90%, PR -50%, high-grade    01/30/2016 Surgery   Left Lumpectomy: No residual DCIS, fibrocystic changes with UDH    02/27/2016 - 04/16/2016 Radiation Therapy   Adjuvant radiation therapy in [facility]    04/28/2016 -  Anti-estrogen oral therapy   Tamoxifen 20 mg daily 5 years    01/19/2019 Breast MRI   Breast MRI showed thickening and focal enhancement in the upper-outer left areola, a 1.3cm enhancement 3-13mm from the lumpectomy site. Biopsy showed hyalinized intraductal papilloma, usual ductal hyperplasia, and no evidence of malignancy.       CHIEF COMPLIANT:  Follow-up of left breast DCIS, anemia, and leukopenia    INTERVAL HISTORY: Kerri Shah is a  61 y.o. with above-mentioned history of left breast DCIS treated with lumpectomy, radiation, and who is currently on anti-estrogen therapy with tamoxifen. She also has a history of anemia and leukopenia. She presents to the clinic today for follow-up. Denies pain and discomfort in breast.  She reports mild discomfort in the left breast with intermittent tingling sensation.  ALLERGIES:  has No Known Allergies.  MEDICATIONS:  Current Outpatient Medications  Medication Sig Dispense Refill    glimepiride (AMARYL) 2 MG tablet Take 2 mg by mouth daily.     metFORMIN (GLUCOPHAGE-XR) 500 MG 24 hr tablet Take 500 mg by mouth at bedtime.   1   Multiple Vitamins-Minerals (MULTIVITAMIN WITH MINERALS) tablet Take 1 tablet by mouth daily.     pravastatin (PRAVACHOL) 40 MG tablet Take 40 mg by mouth at bedtime.   3   No current facility-administered medications for this visit.    PHYSICAL EXAMINATION: ECOG PERFORMANCE STATUS: 1 - Symptomatic but completely ambulatory  Vitals:   07/01/21 1128  BP: (!) 171/83  Pulse: (!) 111  Resp: 19  Temp: 97.9 F (36.6 C)  SpO2: 100%   Filed Weights   07/01/21 1128  Weight: 161 lb 4.8 oz (73.2 kg)    BREAST: No palpable masses or nodules in either right or left breasts. No palpable axillary supraclavicular or infraclavicular adenopathy no breast tenderness or nipple discharge. (exam performed in the presence of a chaperone)  LABORATORY DATA:  I have reviewed the data as listed    Latest Ref Rng & Units 07/01/2020   11:21 AM 02/20/2020    9:26 AM 02/23/2019   10:12 AM  CMP  Glucose 70 - 99 mg/dL 101   126   138    BUN 8 - 23 mg/dL $Remove'12   12   17    'qpXxouq$ Creatinine 0.44 - 1.00 mg/dL 1.03   1.10   1.07    Sodium 135 - 145 mmol/L 143   142   142    Potassium 3.5 - 5.1 mmol/L 3.9   3.7   4.1    Chloride 98 - 111  mmol/L 108   109   105    CO2 22 - 32 mmol/L $RemoveB'28   26   27    'zRvdCcov$ Calcium 8.9 - 10.3 mg/dL 9.5   9.3   9.5    Total Protein 6.5 - 8.1 g/dL 7.2   7.1     Total Bilirubin 0.3 - 1.2 mg/dL 0.3   0.5     Alkaline Phos 38 - 126 U/L 50   45     AST 15 - 41 U/L 17   17     ALT 0 - 44 U/L 13   17       Lab Results  Component Value Date   WBC 4.3 07/01/2021   HGB 10.7 (L) 07/01/2021   HCT 33.5 (L) 07/01/2021   MCV 72.7 (L) 07/01/2021   PLT 230 07/01/2021   NEUTROABS 2.7 07/01/2021    ASSESSMENT & PLAN:  Ductal carcinoma in situ (DCIS) of left breast Left breast [name] biopsy 12/31/2015: DCIS with calcifications ER 90%, PR -50%, high-grade    Treatment Summary: 1. Breast conserving surgery: 01/30/16: ADH 2. Followed by adjuvant radiation therapy 3. Followed by antiestrogen therapy with tamoxifen 5 years started 04/28/2016 4. left lumpectomy (nipple discharge) 03/01/2019: Hyalinized intraductal papilloma   Tamoxifen toxicities: 1. Occasional hot flashes 2. Myalgias She will complete 5 years of tamoxifen therapy by March 2023.  At that time we will stop it.   Breast cancer surveillance: 1.  Breast exam 01/03/2019: Benign 2. mammogram 12/19/2019: Benign breast density category C 3.  breast MRI 02/06/2019: 1.3 cm area of enhancement medial aspect of left nipple areolar complex, stable postoperative seroma 5.6 cm Biopsy 02/08/2019: Hyalinized intraductal papilloma, UDH 03/01/2019: Left lumpectomy: Hyalinized intraductal papilloma, no malignancy identified      Leukopenia  Leukopenia: Resolved Microcytic anemia and leukopenia: Hemoglobin electrophoresis and iron studies were normal Bone marrow biopsy 02/23/2020: Slightly hypercellular bone marrow 50 to 60% cellularity with trilineage hematopoiesis, several lymphoid aggregates present but flow cytometry is negative.  No increase in blasts.  Findings are nonspecific. Cytogenetics and FISH are negative storage iron: Increased, Ring sideroblasts absent   Counseling: Given the normal bone marrow biopsy, I attribute her leukopenia to ethnicity related. There is plenty of iron so therefore there is no role for iron infusions. We will monitor her labs.   Return to clinic in 1 year with labs and follow-up    Orders Placed This Encounter  Procedures   CBC with Differential ([facility] Only)    Standing Status:   Future    Standing Expiration Date:   07/02/2022   Iron and Iron Binding Capacity ([facility] only)    Standing Status:   Future    Standing Expiration Date:   07/02/2022   CMP ([facility] only)    Standing Status:   Future    Standing Expiration Date:   07/02/2022    Ferritin    Standing Status:   Future    Standing Expiration Date:   07/01/2022   Retic Panel    Standing Status:   Future    Standing Expiration Date:   07/02/2022   The patient has a good understanding of the overall plan. she agrees with it. she will call with any problems that may develop before the next visit here. Total time spent: 30 mins including face to face time and time spent for planning, charting and co-ordination of care   Harriette Ohara, MD 07/01/21    I Gardiner Coins am  scribing for Dr. Lindi Adie  I have reviewed the above documentation for accuracy and completeness, and I agree with the above.

## 2021-06-30 ENCOUNTER — Other Ambulatory Visit: Payer: Self-pay | Admitting: *Deleted

## 2021-06-30 DIAGNOSIS — D649 Anemia, unspecified: Secondary | ICD-10-CM

## 2021-07-01 ENCOUNTER — Other Ambulatory Visit: Payer: Self-pay

## 2021-07-01 ENCOUNTER — Inpatient Hospital Stay (HOSPITAL_BASED_OUTPATIENT_CLINIC_OR_DEPARTMENT_OTHER): Payer: 59 | Admitting: Hematology and Oncology

## 2021-07-01 ENCOUNTER — Inpatient Hospital Stay: Payer: 59 | Attending: Hematology and Oncology

## 2021-07-01 VITALS — BP 171/83 | HR 111 | Temp 97.9°F | Resp 19 | Ht 66.0 in | Wt 161.3 lb

## 2021-07-01 DIAGNOSIS — Z7984 Long term (current) use of oral hypoglycemic drugs: Secondary | ICD-10-CM | POA: Insufficient documentation

## 2021-07-01 DIAGNOSIS — Z79810 Long term (current) use of selective estrogen receptor modulators (SERMs): Secondary | ICD-10-CM | POA: Insufficient documentation

## 2021-07-01 DIAGNOSIS — D72819 Decreased white blood cell count, unspecified: Secondary | ICD-10-CM | POA: Diagnosis not present

## 2021-07-01 DIAGNOSIS — Z170 Estrogen receptor positive status [ER+]: Secondary | ICD-10-CM | POA: Diagnosis not present

## 2021-07-01 DIAGNOSIS — R232 Flushing: Secondary | ICD-10-CM | POA: Insufficient documentation

## 2021-07-01 DIAGNOSIS — D649 Anemia, unspecified: Secondary | ICD-10-CM

## 2021-07-01 DIAGNOSIS — Z923 Personal history of irradiation: Secondary | ICD-10-CM | POA: Diagnosis not present

## 2021-07-01 DIAGNOSIS — M797 Fibromyalgia: Secondary | ICD-10-CM | POA: Insufficient documentation

## 2021-07-01 DIAGNOSIS — D0512 Intraductal carcinoma in situ of left breast: Secondary | ICD-10-CM | POA: Insufficient documentation

## 2021-07-01 LAB — CBC WITH DIFFERENTIAL (CANCER CENTER ONLY)
Abs Immature Granulocytes: 0.01 10*3/uL (ref 0.00–0.07)
Basophils Absolute: 0 10*3/uL (ref 0.0–0.1)
Basophils Relative: 1 %
Eosinophils Absolute: 0 10*3/uL (ref 0.0–0.5)
Eosinophils Relative: 1 %
HCT: 33.5 % — ABNORMAL LOW (ref 36.0–46.0)
Hemoglobin: 10.7 g/dL — ABNORMAL LOW (ref 12.0–15.0)
Immature Granulocytes: 0 %
Lymphocytes Relative: 29 %
Lymphs Abs: 1.2 10*3/uL (ref 0.7–4.0)
MCH: 23.2 pg — ABNORMAL LOW (ref 26.0–34.0)
MCHC: 31.9 g/dL (ref 30.0–36.0)
MCV: 72.7 fL — ABNORMAL LOW (ref 80.0–100.0)
Monocytes Absolute: 0.3 10*3/uL (ref 0.1–1.0)
Monocytes Relative: 7 %
Neutro Abs: 2.7 10*3/uL (ref 1.7–7.7)
Neutrophils Relative %: 62 %
Platelet Count: 230 10*3/uL (ref 150–400)
RBC: 4.61 MIL/uL (ref 3.87–5.11)
RDW: 14.9 % (ref 11.5–15.5)
WBC Count: 4.3 10*3/uL (ref 4.0–10.5)
nRBC: 0 % (ref 0.0–0.2)

## 2021-07-01 LAB — IRON AND IRON BINDING CAPACITY (CC-WL,HP ONLY)
Iron: 69 ug/dL (ref 28–170)
Saturation Ratios: 17 % (ref 10.4–31.8)
TIBC: 406 ug/dL (ref 250–450)
UIBC: 337 ug/dL (ref 148–442)

## 2021-07-01 LAB — CMP (CANCER CENTER ONLY)
ALT: 17 U/L (ref 0–44)
AST: 19 U/L (ref 15–41)
Albumin: 4.6 g/dL (ref 3.5–5.0)
Alkaline Phosphatase: 41 U/L (ref 38–126)
Anion gap: 6 (ref 5–15)
BUN: 21 mg/dL (ref 8–23)
CO2: 29 mmol/L (ref 22–32)
Calcium: 9.3 mg/dL (ref 8.9–10.3)
Chloride: 104 mmol/L (ref 98–111)
Creatinine: 1.14 mg/dL — ABNORMAL HIGH (ref 0.44–1.00)
GFR, Estimated: 54 mL/min — ABNORMAL LOW (ref >60)
Glucose, Bld: 199 mg/dL — ABNORMAL HIGH (ref 70–99)
Potassium: 3.7 mmol/L (ref 3.5–5.1)
Sodium: 139 mmol/L (ref 135–145)
Total Bilirubin: 0.5 mg/dL (ref 0.3–1.2)
Total Protein: 7.6 g/dL (ref 6.5–8.1)

## 2021-07-01 LAB — FERRITIN: Ferritin: 350 ng/mL — ABNORMAL HIGH (ref 11–307)

## 2021-07-01 LAB — VITAMIN B12: Vitamin B-12: 1325 pg/mL — ABNORMAL HIGH (ref 180–914)

## 2021-07-01 NOTE — Assessment & Plan Note (Signed)
  Leukopenia: Resolved Microcytic anemia andleukopenia: Hemoglobin electrophoresis and iron studies were normal Bone marrow biopsy 02/23/2020: Slightly hypercellular bone marrow 50 to 60% cellularity with trilineage hematopoiesis, several lymphoid aggregates present but flow cytometry is negative. No increase in blasts. Findings are nonspecific. Cytogenetics and FISH are negative storage iron: Increased, Ring sideroblasts absent  Counseling: Given the normal bone marrow biopsy, I attribute her leukopenia to ethnicity related. There is plenty of iron so therefore there is no role for iron infusions. We will monitor her labs.  Return to clinic in 1 year with labs and follow-up

## 2021-07-01 NOTE — Assessment & Plan Note (Signed)
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 Breast conserving surgery: 01/30/16: ADH 2. Followed by adjuvant radiation therapy 3. Followed by antiestrogen therapy with tamoxifen 5 yearsstarted 04/28/2016 4.left lumpectomy(nipple discharge)03/01/2019: Hyalinized intraductal papilloma  Tamoxifen toxicities: 1.Occasional hot flashes 2.Myalgias She will complete 5 years of tamoxifen therapy by March 2023.  At that time we will stop it.  Breast cancer surveillance: 1.Breast exam11/24/2020: Benign 2.mammogram11/10/2019:Benignbreast density category C 3.breast MRI 02/06/2019: 1.3 cm area of enhancement medial aspect of left nipple areolar complex, stable postoperative seroma 5.6 cm Biopsy 02/08/2019: Hyalinized intraductal papilloma, UDH 03/01/2019: Left lumpectomy: Hyalinized intraductal papilloma, no malignancy identified

## 2021-07-08 NOTE — Progress Notes (Signed)
This encounter was created in error - please disregard.

## 2021-09-02 ENCOUNTER — Encounter: Payer: Self-pay | Admitting: *Deleted

## 2021-09-02 NOTE — Patient Instructions (Signed)
  Procedure: colonoscopy  Estimated body mass index is 26.03 kg/m as calculated from the following:   Height as of 07/01/21: '5\' 6"'$  (1.676 m).   Weight as of 07/01/21: 161 lb 4.8 oz (73.2 kg).   Have you had a colonoscopy before?  Yes 06/29/16, Dr. Oneida Alar  Do you have family history of colon cancer  yes, mother  Previous colonoscopy with polyps removed? yes  Do you have a history colorectal cancer?   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 no  Do you have a pacemaker/defibrillator?   no  Have you had endocarditis/atrial fibrillation?  no  Do you use supplemental oxygen/CPAP?  no  Have you had joint replacement within the last 12 months?  no  Do you tend to be constipated or have to use laxatives?  yes   Do you have history of alcohol use? If yes, how much and how often.  no  Do you have history or are you using drugs? If yes, what do are you  using?  no  Have you ever had a stroke/heart attack?  no  Have you ever had a heart or other vascular stent placed,?no  Do you take weight loss medication? no  female patients,: have you had a hysterectomy? no                              are you post menopausal?  yes                              do you still have your menstrual cycle? no    Date of last menstrual period.   Do you take any blood-thinning medications such as: (Plavix, aspirin, Coumadin, Aggrenox, Brilinta, Xarelto, Eliquis, Pradaxa, Savaysa or Effient) no  If yes we need the name, milligram, dosage and who is prescribing doctor:               Current Outpatient Medications  Medication Sig Dispense Refill   glimepiride (AMARYL) 2 MG tablet Take 2 mg by mouth daily.     metFORMIN (GLUCOPHAGE-XR) 500 MG 24 hr tablet Take 500 mg by mouth at bedtime.   1   Multiple Vitamins-Minerals (MULTIVITAMIN WITH MINERALS) tablet Take 1 tablet by mouth daily.     pravastatin (PRAVACHOL) 40 MG tablet Take 40 mg by mouth at bedtime.   3    No current facility-administered medications for this visit.    No Known Allergies

## 2021-09-11 ENCOUNTER — Encounter: Payer: Self-pay | Admitting: *Deleted

## 2021-09-11 MED ORDER — PEG 3350-KCL-NA BICARB-NACL 420 G PO SOLR: 4000.0000 mL | Freq: Once | ORAL | 0 refills | Status: AC

## 2021-09-11 NOTE — Progress Notes (Signed)
Spoke with pt. Scheduled for 8/21 at 12:15pm. Aware will mail instructions. Rx sent to pharmacy

## 2021-09-11 NOTE — Progress Notes (Signed)
2018: adenoma.   ASA 2. No oral diabetes medication the day of the procedure.

## 2021-09-25 ENCOUNTER — Other Ambulatory Visit: Payer: Self-pay

## 2021-09-25 ENCOUNTER — Encounter (HOSPITAL_COMMUNITY)
Admission: RE | Admit: 2021-09-25 | Discharge: 2021-09-25 | Disposition: A | Discharge status: Home / Self Care | Payer: 59 | Source: Ambulatory Visit | Attending: Internal Medicine | Admitting: Internal Medicine

## 2021-09-25 ENCOUNTER — Encounter (HOSPITAL_COMMUNITY): Payer: Self-pay

## 2021-09-29 ENCOUNTER — Ambulatory Visit (HOSPITAL_BASED_OUTPATIENT_CLINIC_OR_DEPARTMENT_OTHER): Payer: 59 | Admitting: Anesthesiology

## 2021-09-29 ENCOUNTER — Ambulatory Visit (HOSPITAL_COMMUNITY): Payer: 59 | Admitting: Anesthesiology

## 2021-09-29 ENCOUNTER — Ambulatory Visit (HOSPITAL_COMMUNITY)
Admission: RE | Admit: 2021-09-29 | Discharge: 2021-09-29 | Disposition: A | Discharge status: Home / Self Care | Payer: 59 | Attending: Internal Medicine | Admitting: Internal Medicine

## 2021-09-29 ENCOUNTER — Encounter (HOSPITAL_COMMUNITY)
Admission: RE | Disposition: A | Discharge status: Home / Self Care | Payer: Self-pay | Source: Home / Self Care | Attending: Internal Medicine

## 2021-09-29 ENCOUNTER — Encounter (HOSPITAL_COMMUNITY): Payer: Self-pay

## 2021-09-29 ENCOUNTER — Other Ambulatory Visit: Payer: Self-pay

## 2021-09-29 DIAGNOSIS — E119 Type 2 diabetes mellitus without complications: Secondary | ICD-10-CM | POA: Insufficient documentation

## 2021-09-29 DIAGNOSIS — K219 Gastro-esophageal reflux disease without esophagitis: Secondary | ICD-10-CM | POA: Insufficient documentation

## 2021-09-29 DIAGNOSIS — I10 Essential (primary) hypertension: Secondary | ICD-10-CM | POA: Diagnosis not present

## 2021-09-29 DIAGNOSIS — Z7984 Long term (current) use of oral hypoglycemic drugs: Secondary | ICD-10-CM | POA: Diagnosis not present

## 2021-09-29 DIAGNOSIS — Z09 Encounter for follow-up examination after completed treatment for conditions other than malignant neoplasm: Secondary | ICD-10-CM

## 2021-09-29 DIAGNOSIS — Z800 Family history of malignant neoplasm of digestive organs: Secondary | ICD-10-CM | POA: Diagnosis not present

## 2021-09-29 DIAGNOSIS — Z86010 Personal history of colonic polyps: Secondary | ICD-10-CM | POA: Diagnosis present

## 2021-09-29 DIAGNOSIS — Z1211 Encounter for screening for malignant neoplasm of colon: Secondary | ICD-10-CM

## 2021-09-29 DIAGNOSIS — K648 Other hemorrhoids: Secondary | ICD-10-CM

## 2021-09-29 DIAGNOSIS — K5730 Diverticulosis of large intestine without perforation or abscess without bleeding: Secondary | ICD-10-CM

## 2021-09-29 DIAGNOSIS — D649 Anemia, unspecified: Secondary | ICD-10-CM | POA: Diagnosis not present

## 2021-09-29 HISTORY — PX: COLONOSCOPY WITH PROPOFOL: SHX5780

## 2021-09-29 LAB — GLUCOSE, CAPILLARY: Glucose-Capillary: 70 mg/dL (ref 70–99)

## 2021-09-29 SURGERY — COLONOSCOPY WITH PROPOFOL
Anesthesia: General

## 2021-09-29 MED ORDER — LACTATED RINGERS IV SOLN
INTRAVENOUS | Status: DC
Start: 2021-09-29 — End: 2021-09-29

## 2021-09-29 MED ORDER — LIDOCAINE 2% (20 MG/ML) 5 ML SYRINGE
INTRAMUSCULAR | Status: DC | PRN
  Administered 2021-09-29: 50 mg via INTRAVENOUS

## 2021-09-29 MED ORDER — PROPOFOL 500 MG/50ML IV EMUL
INTRAVENOUS | Status: DC | PRN
  Administered 2021-09-29: 200 ug/kg/min via INTRAVENOUS

## 2021-09-29 MED ORDER — PROPOFOL 10 MG/ML IV BOLUS
INTRAVENOUS | Status: DC | PRN
  Administered 2021-09-29: 100 mg via INTRAVENOUS

## 2021-09-29 NOTE — Anesthesia Preprocedure Evaluation (Signed)
Anesthesia Evaluation  Patient identified by MRN, date of birth, ID band Patient awake    Reviewed: Allergy & Precautions, H&P , NPO status , Patient's Chart, lab work & pertinent test results, reviewed documented beta blocker date and time   Airway Mallampati: II  TM Distance: >3 FB Neck ROM: full    Dental no notable dental hx.    Pulmonary neg pulmonary ROS,    Pulmonary exam normal breath sounds clear to auscultation       Cardiovascular Exercise Tolerance: Good hypertension, negative cardio ROS   Rhythm:regular Rate:Normal     Neuro/Psych negative neurological ROS  negative psych ROS   GI/Hepatic Neg liver ROS, GERD  Medicated,  Endo/Other  negative endocrine ROSdiabetes, Type 2  Renal/GU negative Renal ROS  negative genitourinary   Musculoskeletal   Abdominal   Peds  Hematology  (+) Blood dyscrasia, anemia ,   Anesthesia Other Findings   Reproductive/Obstetrics negative OB ROS                             Anesthesia Physical Anesthesia Plan  ASA: 3  Anesthesia Plan: General   Post-op Pain Management:    Induction:   PONV Risk Score and Plan: Propofol infusion  Airway Management Planned:   Additional Equipment:   Intra-op Plan:   Post-operative Plan:   Informed Consent: I have reviewed the patients History and Physical, chart, labs and discussed the procedure including the risks, benefits and alternatives for the proposed anesthesia with the patient or authorized representative who has indicated his/her understanding and acceptance.     Dental Advisory Given  Plan Discussed with: CRNA  Anesthesia Plan Comments:         Anesthesia Quick Evaluation

## 2021-09-29 NOTE — Discharge Instructions (Signed)
  Colonoscopy Discharge Instructions  Read the instructions outlined below and refer to this sheet in the next few weeks. These discharge instructions provide you with general information on caring for yourself after you leave the hospital. Your doctor may also give you specific instructions. While your treatment has been planned according to the most current medical practices available, unavoidable complications occasionally occur.   ACTIVITY You may resume your regular activity, but move at a slower pace for the next 24 hours.  Take frequent rest periods for the next 24 hours.  Walking will help get rid of the air and reduce the bloated feeling in your belly (abdomen).  No driving for 24 hours (because of the medicine (anesthesia) used during the test).   Do not sign any important legal documents or operate any machinery for 24 hours (because of the anesthesia used during the test).  NUTRITION Drink plenty of fluids.  You may resume your normal diet as instructed by your doctor.  Begin with a light meal and progress to your normal diet. Heavy or fried foods are harder to digest and may make you feel sick to your stomach (nauseated).  Avoid alcoholic beverages for 24 hours or as instructed.  MEDICATIONS You may resume your normal medications unless your doctor tells you otherwise.  WHAT YOU CAN EXPECT TODAY Some feelings of bloating in the abdomen.  Passage of more gas than usual.  Spotting of blood in your stool or on the toilet paper.  IF YOU HAD POLYPS REMOVED DURING THE COLONOSCOPY: No aspirin products for 7 days or as instructed.  No alcohol for 7 days or as instructed.  Eat a soft diet for the next 24 hours.  FINDING OUT THE RESULTS OF YOUR TEST Not all test results are available during your visit. If your test results are not back during the visit, make an appointment with your caregiver to find out the results. Do not assume everything is normal if you have not heard from your  caregiver or the medical facility. It is important for you to follow up on all of your test results.  SEEK IMMEDIATE MEDICAL ATTENTION IF: You have more than a spotting of blood in your stool.  Your belly is swollen (abdominal distention).  You are nauseated or vomiting.  You have a temperature over 101.  You have abdominal pain or discomfort that is severe or gets worse throughout the day.   Your colonoscopy was relatively unremarkable.  I did not find any polyps or evidence of colon cancer.  I recommend repeating colonoscopy in 5 years given your family history of colon cancer in mother.  You do have diverticulosis and internal hemorrhoids. I would recommend increasing fiber in your diet or adding OTC Benefiber/Metamucil. Be sure to drink at least 4 to 6 glasses of water daily. Follow-up with GI as needed.   I hope you have a great rest of your week!  Elon Alas. Abbey Chatters, D.O. Gastroenterology and Hepatology [facility]

## 2021-09-29 NOTE — Op Note (Signed)
[facility] Patient Name: Kerri Shah Procedure Date: 09/29/2021 10:57 AM MRN: [id number] Date of Birth: 1960-05-03 Attending MD: Elon Alas. Abbey Chatters DO CSN: [id number] Age: 61 Admit Type: Outpatient Procedure:                Colonoscopy Indications:              Colon cancer screening in patient at increased                            risk: Colorectal cancer in mother, Surveillance:                            Personal history of adenomatous polyps on last                            colonoscopy 5 years ago Providers:                Elon Alas. Abbey Chatters, DO, Caprice Kluver, Crystal Page,                            Raphael Gibney, Technician, Ladoris Gene Technician,                            Technician Referring MD:              Medicines:                See the Anesthesia note for documentation of the                            administered medications Complications:            No immediate complications. Estimated Blood Loss:     Estimated blood loss: none. Procedure:                Pre-Anesthesia Assessment:                           - The anesthesia plan was to use monitored                            anesthesia care (MAC).                           After obtaining informed consent, the colonoscope                            was passed under direct vision. Throughout the                            procedure, the patient's blood pressure, pulse, and                            oxygen saturations were monitored continuously. The                            [id number]-HQ190L ([id number]) scope was introduced through  the anus and advanced to the the cecum, identified                            by appendiceal orifice and ileocecal valve. The                            colonoscopy was performed without difficulty. The                            patient tolerated the procedure well. The quality                            of the bowel preparation was evaluated using the                             BBPS [facility] Bowel Preparation Scale) with scores                            of: Right Colon = 3, Transverse Colon = 3 and Left                            Colon = 3 (entire mucosa seen well with no residual                            staining, small fragments of stool or opaque                            liquid). The total BBPS score equals 9. Scope In: 11:06:29 AM Scope Out: 32:20:25 AM Scope Withdrawal Time: 0 hours 6 minutes 44 seconds  Total Procedure Duration: 0 hours 10 minutes 15 seconds  Findings:      The perianal and digital rectal examinations were normal.      Non-bleeding internal hemorrhoids were found during endoscopy.      Multiple small-mouthed diverticula were found in the sigmoid colon.      The exam was otherwise without abnormality. Impression:               - Non-bleeding internal hemorrhoids.                           - Diverticulosis in the sigmoid colon.                           - The examination was otherwise normal.                           - No specimens collected. Moderate Sedation:      Per Anesthesia Care Recommendation:           - Patient has a contact number available for                            emergencies. The signs and symptoms of potential  delayed complications were discussed with the                            patient. Return to normal activities tomorrow.                            Written discharge instructions were provided to the                            patient.                           - Resume previous diet.                           - Continue present medications.                           - Repeat colonoscopy in 5 years for high risk                            screening purposes/family history of colon cancer                            in mother                           - Return to GI clinic PRN. Procedure Code(s):        --- Professional ---                            O9735, Colorectal cancer screening; colonoscopy on                            individual at high risk Diagnosis Code(s):        --- Professional ---                           Z80.0, Family history of malignant neoplasm of                            digestive organs                           Z86.010, Personal history of colonic polyps                           K64.8, Other hemorrhoids                           K57.30, Diverticulosis of large intestine without                            perforation or abscess without bleeding CPT copyright 2019 American Medical Association. All rights reserved. The codes documented in this report are preliminary and upon coder review may  be revised to meet current compliance requirements. Elon Alas. Abbey Chatters, DO Juanda Crumble  Arlee Muslim, DO 09/29/2021 11:18:51 AM This report has been signed electronically. Number of Addenda: 0

## 2021-09-29 NOTE — Anesthesia Postprocedure Evaluation (Signed)
Anesthesia Post Note  Patient: Kerri Shah  Procedure(s) Performed: COLONOSCOPY WITH PROPOFOL  Patient location during evaluation: Phase II Anesthesia Type: General Level of consciousness: awake Pain management: pain level controlled Vital Signs Assessment: post-procedure vital signs reviewed and stable Respiratory status: spontaneous breathing and respiratory function stable Cardiovascular status: blood pressure returned to baseline and stable Postop Assessment: no headache and no apparent nausea or vomiting Anesthetic complications: no Comments: Late entry   No notable events documented.   Last Vitals:  Vitals:   09/29/21 1034 09/29/21 1121  BP: (!) 157/89 123/63  Pulse:  88  Resp: 12 15  Temp: 36.7 C 36.7 C  SpO2: 100% 100%    Last Pain:  Vitals:   09/29/21 1121  TempSrc: Oral  PainSc: 0-No pain                 Louann Sjogren

## 2021-09-29 NOTE — H&P (Signed)
Primary Care Physician:  Monico Blitz, MD Primary Gastroenterologist:  Dr. Abbey Chatters  Pre-Procedure History & Physical: HPI:  Kerri Shah is a 61 y.o. female is here for a colonoscopy to be performed for surveillance purposes, personal history of adenomatous colon polyp 2018.  Past Medical History:  Diagnosis Date   Breast cancer ([place]) 2017   left breast lumpectomy   Breast discharge    lt bloody discharge x's 3 weeks    Breast mass    lt breast mass x's 3 weeks    Complication of anesthesia    DM (diabetes mellitus) ([place])    Fatty liver    GERD (gastroesophageal reflux disease)    History of kidney stones    Hypercholesteremia    Hypertension    Osteoarthritis    bilateral knees and Left shoulder   Personal history of radiation therapy 04/2016   radiation    PONV (postoperative nausea and vomiting)    Vitamin D deficiency     Past Surgical History:  Procedure Laterality Date   ABDOMINAL HYSTERECTOMY     BREAST LUMPECTOMY Left 2018   BREAST LUMPECTOMY Left 03/01/2019   BREAST LUMPECTOMY WITH RADIOACTIVE SEED LOCALIZATION Left 01/30/2016   Procedure: BREAST LUMPECTOMY WITH RADIOACTIVE SEED LOCALIZATION;  Surgeon: [name], MD;  Location: [facility];  Service: General;  Laterality: Left;   CHOLECYSTECTOMY N/A 07/30/2017   Procedure: LAPAROSCOPIC CHOLECYSTECTOMY;  Surgeon: Virl Cagey, MD;  Location: [facility];  Service: General;  Laterality: N/A;   COLONOSCOPY  12/23/10   internal hemorrhoids/polyps in the recto-sigmoid colon, hyperplastic, TCS IN 5 years   COLONOSCOPY N/A 06/29/2016   Dr. Oneida Alar: Nonthrombosed external hemorrhoids, 2 2 to 3 mm polyps removed, mild diverticulosis, internal hemorrhoids.  Path revealed tubular adenoma.  Next colonoscopy 5 years based on family history as well.   ESOPHAGOGASTRODUODENOSCOPY N/A 02/07/2018   Dr. Oneida Alar: Mild gastritis, pyloric stenosis status post dilation.  Placement of Givens capsule at the same  time.   GIVENS CAPSULE STUDY N/A 02/07/2018   Dr. Oneida Alar: Normal   PARTIAL HYSTERECTOMY  2010   uterine fibroids   RADIOACTIVE SEED GUIDED EXCISIONAL BREAST BIOPSY Left 03/01/2019   Procedure: RADIOACTIVE SEED GUIDED EXCISIONAL LEFT BREAST BIOPSY;  Surgeon: [name], MD;  Location: [facility];  Service: General;  Laterality: Left;   TUBAL LIGATION     UPPER GASTROINTESTINAL ENDOSCOPY  Nov 2012   mild gastritis, patent esophageal ring s/p forceps dilation, chronic gastritis on path    Prior to Admission medications   Medication Sig Start Date End Date Taking? Authorizing Provider  Ascorbic Acid (VITAMIN C) 1000 MG tablet Take 1,000 mg by mouth daily.   Yes [provider]  cholecalciferol (VITAMIN D3) 25 MCG (1000 UNIT) tablet Take 1,000 Units by mouth daily.   Yes [provider]  ferrous sulfate 325 (65 FE) MG tablet Take 325 mg by mouth daily.   Yes [provider]  glimepiride (AMARYL) 2 MG tablet Take 2 mg by mouth every other day. 05/04/19  Yes [provider]  magnesium oxide (MAG-OX) 400 (240 Mg) MG tablet Take 400 mg by mouth daily.   Yes [provider]  metFORMIN (GLUCOPHAGE) 500 MG tablet Take 500 mg by mouth at bedtime. 06/12/21  Yes [provider]  Multiple Vitamins-Minerals (MULTIVITAMIN WITH MINERALS) tablet Take 1 tablet by mouth daily.   Yes [provider]  pravastatin (PRAVACHOL) 40 MG tablet Take 40 mg by mouth at bedtime.  01/13/16  Yes [provider]  zinc gluconate 50 MG tablet Take 50 mg by mouth daily.   Yes [provider]    Allergies as of 09/11/2021   (No Known Allergies)    Family History  Problem Relation Age of Onset   Colon cancer Mother 20   Colon cancer Brother 56   Diabetes Brother     Social History   Socioeconomic History   Marital status: Married    Spouse name: Not on file   Number of children: 2   Years of education: Not on file    Highest education level: Not on file  Occupational History   Occupation: Nsg Aide  Tobacco Use   Smoking status: Never   Smokeless tobacco: Never  Vaping Use   Vaping Use: Never used  Substance and Sexual Activity   Alcohol use: No   Drug use: No   Sexual activity: Yes    Birth control/protection: Surgical  Other Topics Concern   Not on file  Social History Narrative   Not on file   Social Determinants of Health   Financial Resource Strain: Not on file  Food Insecurity: Not on file  Transportation Needs: Not on file  Physical Activity: Not on file  Stress: Not on file  Social Connections: Not on file  Intimate Partner Violence: Not on file    Review of Systems: See HPI, otherwise negative ROS  Physical Exam: Vital signs in last 24 hours: Temp:  [98.1 F (36.7 C)] 98.1 F (36.7 C) (08/21 1034) Resp:  [12] 12 (08/21 1034) BP: (157)/(89) 157/89 (08/21 1034) SpO2:  [100 %] 100 % (08/21 1034) Weight:  [74.8 kg] 74.8 kg (08/21 1034)   General:   Alert,  Well-developed, well-nourished, pleasant and cooperative in NAD Head:  Normocephalic and atraumatic. Eyes:  Sclera clear, no icterus.   Conjunctiva pink. Ears:  Normal auditory acuity. Nose:  No deformity, discharge,  or lesions. Mouth:  No deformity or lesions, dentition normal. Neck:  Supple; no masses or thyromegaly. Lungs:  Clear throughout to auscultation.   No wheezes, crackles, or rhonchi. No acute distress. Heart:  Regular rate and rhythm; no murmurs, clicks, rubs,  or gallops. Abdomen:  Soft, nontender and nondistended. No masses, hepatosplenomegaly or hernias noted. Normal bowel sounds, without guarding, and without rebound.   Msk:  Symmetrical without gross deformities. Normal posture. Extremities:  Without clubbing or edema. Neurologic:  Alert and  oriented x4;  grossly normal neurologically. Skin:  Intact without significant lesions or rashes. Cervical Nodes:  No significant cervical adenopathy. Psych:   Alert and cooperative. Normal mood and affect.  Impression/Plan: Kerri Shah is here for a colonoscopy to be performed for surveillance purposes, personal history of adenomatous colon polyp 2018.  The risks of the procedure including infection, bleed, or perforation as well as benefits, limitations, alternatives and imponderables have been reviewed with the patient. Questions have been answered. All parties agreeable.

## 2021-09-29 NOTE — Transfer of Care (Signed)
Immediate Anesthesia Transfer of Care Note  Patient: Kerri Shah  Procedure(s) Performed: COLONOSCOPY WITH PROPOFOL  Patient Location: Short Stay  Anesthesia Type:General  Level of Consciousness: awake, alert , oriented and patient cooperative  Airway & Oxygen Therapy: Patient Spontanous Breathing  Post-op Assessment: Report given to RN, Post -op Vital signs reviewed and stable and Patient moving all extremities  Post vital signs: Reviewed and stable  Last Vitals:  Vitals Value Taken Time  BP 123/63 09/29/21 1121  Temp 36.7 C 09/29/21 1121  Pulse 88 09/29/21 1121  Resp 15 09/29/21 1121  SpO2 100 % 09/29/21 1121    Last Pain:  Vitals:   09/29/21 1121  TempSrc: Oral  PainSc: 0-No pain      Patients Stated Pain Goal: 7 (84/53/64 6803)  Complications: No notable events documented.

## 2021-10-06 ENCOUNTER — Encounter (HOSPITAL_COMMUNITY): Payer: Self-pay | Admitting: Internal Medicine

## 2021-11-13 ENCOUNTER — Other Ambulatory Visit: Payer: Self-pay | Admitting: Internal Medicine

## 2021-11-13 DIAGNOSIS — Z853 Personal history of malignant neoplasm of breast: Secondary | ICD-10-CM

## 2021-11-13 DIAGNOSIS — Z1231 Encounter for screening mammogram for malignant neoplasm of breast: Secondary | ICD-10-CM

## 2021-12-10 DIAGNOSIS — Z853 Personal history of malignant neoplasm of breast: Secondary | ICD-10-CM | POA: Diagnosis not present

## 2021-12-10 DIAGNOSIS — E7801 Familial hypercholesterolemia: Secondary | ICD-10-CM | POA: Diagnosis not present

## 2021-12-10 DIAGNOSIS — R030 Elevated blood-pressure reading, without diagnosis of hypertension: Secondary | ICD-10-CM | POA: Diagnosis not present

## 2021-12-10 DIAGNOSIS — R300 Dysuria: Secondary | ICD-10-CM | POA: Diagnosis not present

## 2021-12-10 DIAGNOSIS — E1169 Type 2 diabetes mellitus with other specified complication: Secondary | ICD-10-CM | POA: Diagnosis not present

## 2021-12-10 DIAGNOSIS — Z800 Family history of malignant neoplasm of digestive organs: Secondary | ICD-10-CM | POA: Diagnosis not present

## 2021-12-10 DIAGNOSIS — Z6826 Body mass index (BMI) 26.0-26.9, adult: Secondary | ICD-10-CM | POA: Diagnosis not present

## 2021-12-10 DIAGNOSIS — E7849 Other hyperlipidemia: Secondary | ICD-10-CM | POA: Diagnosis not present

## 2021-12-10 DIAGNOSIS — Z87442 Personal history of urinary calculi: Secondary | ICD-10-CM | POA: Diagnosis not present

## 2021-12-26 ENCOUNTER — Ambulatory Visit
Admission: RE | Admit: 2021-12-26 | Discharge: 2021-12-26 | Disposition: A | Discharge status: Home / Self Care | Payer: 59 | Source: Ambulatory Visit | Attending: Internal Medicine | Admitting: Internal Medicine

## 2021-12-26 ENCOUNTER — Other Ambulatory Visit: Payer: Self-pay | Admitting: Internal Medicine

## 2021-12-26 ENCOUNTER — Encounter: Payer: Self-pay | Admitting: Internal Medicine

## 2021-12-26 DIAGNOSIS — N6310 Unspecified lump in the right breast, unspecified quadrant: Secondary | ICD-10-CM | POA: Diagnosis not present

## 2021-12-26 DIAGNOSIS — N6320 Unspecified lump in the left breast, unspecified quadrant: Secondary | ICD-10-CM

## 2021-12-26 DIAGNOSIS — R92323 Mammographic fibroglandular density, bilateral breasts: Secondary | ICD-10-CM | POA: Diagnosis not present

## 2021-12-26 DIAGNOSIS — Z853 Personal history of malignant neoplasm of breast: Secondary | ICD-10-CM

## 2021-12-26 DIAGNOSIS — N6012 Diffuse cystic mastopathy of left breast: Secondary | ICD-10-CM | POA: Diagnosis not present

## 2022-02-13 DIAGNOSIS — R300 Dysuria: Secondary | ICD-10-CM | POA: Diagnosis not present

## 2022-04-15 DIAGNOSIS — Z1329 Encounter for screening for other suspected endocrine disorder: Secondary | ICD-10-CM | POA: Diagnosis not present

## 2022-04-15 DIAGNOSIS — Z853 Personal history of malignant neoplasm of breast: Secondary | ICD-10-CM | POA: Diagnosis not present

## 2022-04-15 DIAGNOSIS — E7800 Pure hypercholesterolemia, unspecified: Secondary | ICD-10-CM | POA: Diagnosis not present

## 2022-04-15 DIAGNOSIS — Z0001 Encounter for general adult medical examination with abnormal findings: Secondary | ICD-10-CM | POA: Diagnosis not present

## 2022-04-15 DIAGNOSIS — E1169 Type 2 diabetes mellitus with other specified complication: Secondary | ICD-10-CM | POA: Diagnosis not present

## 2022-04-15 DIAGNOSIS — E782 Mixed hyperlipidemia: Secondary | ICD-10-CM | POA: Diagnosis not present

## 2022-04-20 DIAGNOSIS — E782 Mixed hyperlipidemia: Secondary | ICD-10-CM | POA: Diagnosis not present

## 2022-04-20 DIAGNOSIS — Z87442 Personal history of urinary calculi: Secondary | ICD-10-CM | POA: Diagnosis not present

## 2022-04-20 DIAGNOSIS — R198 Other specified symptoms and signs involving the digestive system and abdomen: Secondary | ICD-10-CM | POA: Diagnosis not present

## 2022-04-20 DIAGNOSIS — N390 Urinary tract infection, site not specified: Secondary | ICD-10-CM | POA: Diagnosis not present

## 2022-04-20 DIAGNOSIS — R635 Abnormal weight gain: Secondary | ICD-10-CM | POA: Diagnosis not present

## 2022-04-20 DIAGNOSIS — B9620 Unspecified Escherichia coli [E. coli] as the cause of diseases classified elsewhere: Secondary | ICD-10-CM | POA: Diagnosis not present

## 2022-04-20 DIAGNOSIS — Z6828 Body mass index (BMI) 28.0-28.9, adult: Secondary | ICD-10-CM | POA: Diagnosis not present

## 2022-04-20 DIAGNOSIS — E1169 Type 2 diabetes mellitus with other specified complication: Secondary | ICD-10-CM | POA: Diagnosis not present

## 2022-04-20 DIAGNOSIS — R300 Dysuria: Secondary | ICD-10-CM | POA: Diagnosis not present

## 2022-04-23 ENCOUNTER — Other Ambulatory Visit: Payer: Self-pay | Admitting: Family Medicine

## 2022-04-23 DIAGNOSIS — R198 Other specified symptoms and signs involving the digestive system and abdomen: Secondary | ICD-10-CM

## 2022-04-23 DIAGNOSIS — R635 Abnormal weight gain: Secondary | ICD-10-CM

## 2022-05-01 DIAGNOSIS — E119 Type 2 diabetes mellitus without complications: Secondary | ICD-10-CM | POA: Diagnosis not present

## 2022-05-21 ENCOUNTER — Ambulatory Visit
Admission: RE | Admit: 2022-05-21 | Discharge: 2022-05-21 | Disposition: A | Discharge status: Home / Self Care | Payer: 59 | Source: Ambulatory Visit | Attending: Family Medicine | Admitting: Family Medicine

## 2022-05-21 DIAGNOSIS — R635 Abnormal weight gain: Secondary | ICD-10-CM

## 2022-05-21 DIAGNOSIS — M47817 Spondylosis without myelopathy or radiculopathy, lumbosacral region: Secondary | ICD-10-CM | POA: Diagnosis not present

## 2022-05-21 DIAGNOSIS — R918 Other nonspecific abnormal finding of lung field: Secondary | ICD-10-CM | POA: Diagnosis not present

## 2022-05-21 DIAGNOSIS — R198 Other specified symptoms and signs involving the digestive system and abdomen: Secondary | ICD-10-CM

## 2022-05-21 DIAGNOSIS — R109 Unspecified abdominal pain: Secondary | ICD-10-CM | POA: Diagnosis not present

## 2022-05-21 DIAGNOSIS — Z853 Personal history of malignant neoplasm of breast: Secondary | ICD-10-CM | POA: Diagnosis not present

## 2022-05-21 MED ORDER — IOPAMIDOL (ISOVUE-370) INJECTION 76%
100.0000 mL | Freq: Once | INTRAVENOUS | Status: AC | PRN
  Administered 2022-05-21: 100 mL via INTRAVENOUS

## 2022-05-29 ENCOUNTER — Other Ambulatory Visit: Payer: Self-pay | Admitting: Family Medicine

## 2022-05-29 DIAGNOSIS — R911 Solitary pulmonary nodule: Secondary | ICD-10-CM

## 2022-06-12 ENCOUNTER — Telehealth: Payer: Self-pay | Admitting: Hematology and Oncology

## 2022-06-12 NOTE — Telephone Encounter (Signed)
Rescheduled appointments per room/resource. Left voicemail. 

## 2022-07-01 ENCOUNTER — Ambulatory Visit
Admission: RE | Admit: 2022-07-01 | Discharge: 2022-07-01 | Disposition: A | Discharge status: Home / Self Care | Payer: 59 | Source: Ambulatory Visit | Attending: Family Medicine | Admitting: Family Medicine

## 2022-07-01 DIAGNOSIS — R911 Solitary pulmonary nodule: Secondary | ICD-10-CM | POA: Diagnosis not present

## 2022-07-02 ENCOUNTER — Inpatient Hospital Stay: Payer: 59

## 2022-07-02 ENCOUNTER — Inpatient Hospital Stay: Payer: 59 | Admitting: Hematology and Oncology

## 2022-07-05 NOTE — Progress Notes (Signed)
Patient Care Team: Kirstie Peri, MD as PCP - General (Internal Medicine) West Bali, MD (Inactive) (Gastroenterology) Axel Filler, Larna Daughters, NP as Nurse Practitioner (Hematology and Oncology) Serena Croissant, MD as Consulting Physician (Hematology and Oncology) Lonie Peak, MD as Attending Physician (Radiation Oncology) Almond Lint, MD as Consulting Physician (General Surgery)  DIAGNOSIS:  Encounter Diagnoses  Name Primary?   Ductal carcinoma in situ (DCIS) of left breast Yes   Leukopenia, unspecified type     SUMMARY OF ONCOLOGIC HISTORY: Oncology History  Ductal carcinoma in situ (DCIS) of left breast  12/31/2015 Initial Diagnosis   Left breast [name] biopsy: DCIS with calcifications ER 90%, PR -50%, high-grade   01/30/2016 Surgery   Left Lumpectomy: No residual DCIS, fibrocystic changes with UDH   02/27/2016 - 04/16/2016 Radiation Therapy   Adjuvant radiation therapy in [facility]   04/28/2016 -  Anti-estrogen oral therapy   Tamoxifen 20 mg daily 5 years   01/19/2019 Breast MRI   Breast MRI showed thickening and focal enhancement in the upper-outer left areola, a 1.3cm enhancement 3-63mm from the lumpectomy site. Biopsy showed hyalinized intraductal papilloma, usual ductal hyperplasia, and no evidence of malignancy.     CHIEF COMPLIANT: Follow-up of left breast surveillance    INTERVAL HISTORY: Kerri Shah is a 62 y.o. with above-mentioned history of left breast DCIS surveillance. She also has a history of anemia and leukopenia. She presents to the clinic today for follow-up. She reports that she has been doing well since stopping the tamoxifen.  She had symptoms of abdominal discomfort and underwent a CT of the abdomen which then identified small lung nodules measuring 4 mm and 2 mm respectively.  She then underwent a CT chest without contrast which confirmed these 2 nodules but also revealed groundglass density in the right upper lobe.  The recommendation was  to get a CT in 6 months.  Otherwise she is asymptomatic.   ALLERGIES:  has No Known Allergies.  MEDICATIONS:  Current Outpatient Medications  Medication Sig Dispense Refill   Ascorbic Acid (VITAMIN C) 1000 MG tablet Take 1,000 mg by mouth daily.     cholecalciferol (VITAMIN D3) 25 MCG (1000 UNIT) tablet Take 1,000 Units by mouth daily.     ferrous sulfate 325 (65 FE) MG tablet Take 325 mg by mouth daily.     glimepiride (AMARYL) 2 MG tablet Take 2 mg by mouth every other day.     magnesium oxide (MAG-OX) 400 (240 Mg) MG tablet Take 400 mg by mouth daily.     metFORMIN (GLUCOPHAGE) 500 MG tablet Take 500 mg by mouth at bedtime.     Multiple Vitamins-Minerals (MULTIVITAMIN WITH MINERALS) tablet Take 1 tablet by mouth daily.     pravastatin (PRAVACHOL) 40 MG tablet Take 40 mg by mouth at bedtime.   3   zinc gluconate 50 MG tablet Take 50 mg by mouth daily.     No current facility-administered medications for this visit.    PHYSICAL EXAMINATION: ECOG PERFORMANCE STATUS: 1 - Symptomatic but completely ambulatory  Vitals:   07/10/22 1013  BP: (!) 178/75  Pulse: 99  Resp: 18  Temp: (!) 97.5 F (36.4 C)  SpO2: 100%   Filed Weights   07/10/22 1013  Weight: 176 lb 11.2 oz (80.2 kg)      LABORATORY DATA:  I have reviewed the data as listed    Latest Ref Rng & Units 07/10/2022    9:57 AM 07/01/2021   11:21 AM 07/01/2020  11:21 AM  CMP  Glucose 70 - 99 mg/dL 161  096  045   BUN 8 - 23 mg/dL 19  21  12    Creatinine 0.44 - 1.00 mg/dL 4.09  8.11  9.14   Sodium 135 - 145 mmol/L 140  139  143   Potassium 3.5 - 5.1 mmol/L 3.9  3.7  3.9   Chloride 98 - 111 mmol/L 105  104  108   CO2 22 - 32 mmol/L 25  29  28    Calcium 8.9 - 10.3 mg/dL 78.2  9.3  9.5   Total Protein 6.5 - 8.1 g/dL 7.8  7.6  7.2   Total Bilirubin 0.3 - 1.2 mg/dL 0.6  0.5  0.3   Alkaline Phos 38 - 126 U/L 64  41  50   AST 15 - 41 U/L 21  19  17    ALT 0 - 44 U/L 26  17  13      Lab Results  Component Value Date    WBC 3.3 (L) 07/10/2022   HGB 12.1 07/10/2022   HCT 37.8 07/10/2022   MCV 74.1 (L) 07/10/2022   PLT 248 07/10/2022   NEUTROABS 1.9 07/10/2022    ASSESSMENT & PLAN:  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 Breast conserving surgery: 01/30/16: ADH 2. Followed by adjuvant radiation therapy 3. Followed by antiestrogen therapy with tamoxifen 5 years started 04/28/2016 completed March 2023 4. left lumpectomy (nipple discharge) 03/01/2019: Hyalinized intraductal papilloma    Breast cancer surveillance: 1.  Breast exam : Benign 2. mammogram and ultrasound 12/26/2021: Benign breast density category B 3.  breast MRI 02/06/2019: 1.3 cm area of enhancement medial aspect of left nipple areolar complex, stable postoperative seroma 5.6 cm Biopsy 02/08/2019: Hyalinized intraductal papilloma, UDH 03/01/2019: Left lumpectomy: Hyalinized intraductal papilloma, no malignancy identified  05/23/2022: CT abdomen performed for abdominal discomfort: Picked up right lung base nodules 4 mm and 2 mm 07/06/2022: CT chest: 2.4 centimeter ill-defined groundglass opacity subpleural right upper lobe indeterminate, 4 mm and 2 mm nodules right lower lobe  Plan: Repeat CT in 6 months and follow-up after that. I tried to reassure her that these findings are very nonspecific and probably are not related to her breast cancer diagnosis. Return to clinic on an as-needed basis.  Leukopenia Microcytic anemia and leukopenia: Hemoglobin electrophoresis and iron studies were normal Bone marrow biopsy 02/23/2020: Slightly hypercellular bone marrow 50 to 60% cellularity with trilineage hematopoiesis, several lymphoid aggregates present but flow cytometry is negative.  No increase in blasts.  Findings are nonspecific. Cytogenetics and FISH are negative storage iron: Increased, Ring sideroblasts absent  Lab  review: 07/01/2021: WBC 4.3, hemoglobin 10.7, platelets 230, ferritin 350, B12 1325, iron saturation 17%, creatinine 1.14 07/10/2022: WBC 3.3 (recent UTIs) hemoglobin is 12.1, platelets 248, iron studies are normal, B12 pending       Orders Placed This Encounter  Procedures   CT Chest Wo Contrast    Standing Status:   Future    Standing Expiration Date:   07/10/2023    Order Specific Question:   Preferred imaging location?    Answer:   [facility]   The patient has a good understanding of the overall plan. she agrees with it. she will call with any problems that may develop before the next visit here. Total time spent: 30 mins including face to face time and  time spent for planning, charting and co-ordination of care   Tamsen Meek, MD 07/10/22    I Janan Ridge am acting as a Neurosurgeon for Dr.Aeon Koors  I have reviewed the above documentation for accuracy and completeness, and I agree with the above.

## 2022-07-09 ENCOUNTER — Other Ambulatory Visit: Payer: Self-pay

## 2022-07-09 DIAGNOSIS — D0512 Intraductal carcinoma in situ of left breast: Secondary | ICD-10-CM

## 2022-07-09 DIAGNOSIS — D649 Anemia, unspecified: Secondary | ICD-10-CM

## 2022-07-10 ENCOUNTER — Inpatient Hospital Stay: Payer: 59 | Admitting: Hematology and Oncology

## 2022-07-10 ENCOUNTER — Inpatient Hospital Stay: Payer: 59 | Attending: Hematology and Oncology

## 2022-07-10 VITALS — BP 178/75 | HR 99 | Temp 97.5°F | Resp 18 | Ht 66.5 in | Wt 176.7 lb

## 2022-07-10 DIAGNOSIS — Z923 Personal history of irradiation: Secondary | ICD-10-CM | POA: Diagnosis not present

## 2022-07-10 DIAGNOSIS — D0512 Intraductal carcinoma in situ of left breast: Secondary | ICD-10-CM | POA: Diagnosis not present

## 2022-07-10 DIAGNOSIS — Z7984 Long term (current) use of oral hypoglycemic drugs: Secondary | ICD-10-CM | POA: Diagnosis not present

## 2022-07-10 DIAGNOSIS — Z79811 Long term (current) use of aromatase inhibitors: Secondary | ICD-10-CM | POA: Insufficient documentation

## 2022-07-10 DIAGNOSIS — D72819 Decreased white blood cell count, unspecified: Secondary | ICD-10-CM

## 2022-07-10 DIAGNOSIS — Z170 Estrogen receptor positive status [ER+]: Secondary | ICD-10-CM | POA: Diagnosis not present

## 2022-07-10 DIAGNOSIS — D649 Anemia, unspecified: Secondary | ICD-10-CM

## 2022-07-10 DIAGNOSIS — Z79899 Other long term (current) drug therapy: Secondary | ICD-10-CM | POA: Diagnosis not present

## 2022-07-10 LAB — CMP (CANCER CENTER ONLY)
ALT: 26 U/L (ref 0–44)
AST: 21 U/L (ref 15–41)
Albumin: 4.9 g/dL (ref 3.5–5.0)
Alkaline Phosphatase: 64 U/L (ref 38–126)
Anion gap: 10 (ref 5–15)
BUN: 19 mg/dL (ref 8–23)
CO2: 25 mmol/L (ref 22–32)
Calcium: 10 mg/dL (ref 8.9–10.3)
Chloride: 105 mmol/L (ref 98–111)
Creatinine: 1.03 mg/dL — ABNORMAL HIGH (ref 0.44–1.00)
GFR, Estimated: >60 mL/min (ref >60)
Glucose, Bld: 240 mg/dL — ABNORMAL HIGH (ref 70–99)
Potassium: 3.9 mmol/L (ref 3.5–5.1)
Sodium: 140 mmol/L (ref 135–145)
Total Bilirubin: 0.6 mg/dL (ref 0.3–1.2)
Total Protein: 7.8 g/dL (ref 6.5–8.1)

## 2022-07-10 LAB — CBC WITH DIFFERENTIAL (CANCER CENTER ONLY)
Abs Immature Granulocytes: 0.02 10*3/uL (ref 0.00–0.07)
Basophils Absolute: 0 10*3/uL (ref 0.0–0.1)
Basophils Relative: 1 %
Eosinophils Absolute: 0.1 10*3/uL (ref 0.0–0.5)
Eosinophils Relative: 2 %
HCT: 37.8 % (ref 36.0–46.0)
Hemoglobin: 12.1 g/dL (ref 12.0–15.0)
Immature Granulocytes: 1 %
Lymphocytes Relative: 32 %
Lymphs Abs: 1 10*3/uL (ref 0.7–4.0)
MCH: 23.7 pg — ABNORMAL LOW (ref 26.0–34.0)
MCHC: 32 g/dL (ref 30.0–36.0)
MCV: 74.1 fL — ABNORMAL LOW (ref 80.0–100.0)
Monocytes Absolute: 0.3 10*3/uL (ref 0.1–1.0)
Monocytes Relative: 8 %
Neutro Abs: 1.9 10*3/uL (ref 1.7–7.7)
Neutrophils Relative %: 56 %
Platelet Count: 248 10*3/uL (ref 150–400)
RBC: 5.1 MIL/uL (ref 3.87–5.11)
RDW: 14.8 % (ref 11.5–15.5)
WBC Count: 3.3 10*3/uL — ABNORMAL LOW (ref 4.0–10.5)
nRBC: 0 % (ref 0.0–0.2)

## 2022-07-10 LAB — IRON AND IRON BINDING CAPACITY (CC-WL,HP ONLY)
Iron: 91 ug/dL (ref 28–170)
Saturation Ratios: 21 % (ref 10.4–31.8)
TIBC: 437 ug/dL (ref 250–450)
UIBC: 346 ug/dL (ref 148–442)

## 2022-07-10 LAB — VITAMIN B12: Vitamin B-12: 823 pg/mL (ref 180–914)

## 2022-07-10 LAB — FERRITIN: Ferritin: 217 ng/mL (ref 11–307)

## 2022-07-10 NOTE — Assessment & Plan Note (Addendum)
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 Breast conserving surgery: 01/30/16: ADH 2. Followed by adjuvant radiation therapy 3. Followed by antiestrogen therapy with tamoxifen 5 years started 04/28/2016 completed March 2023 4. left lumpectomy (nipple discharge) 03/01/2019: Hyalinized intraductal papilloma    Breast cancer surveillance: 1.  Breast exam : Benign 2. mammogram and ultrasound 12/26/2021: Benign breast density category B 3.  breast MRI 02/06/2019: 1.3 cm area of enhancement medial aspect of left nipple areolar complex, stable postoperative seroma 5.6 cm Biopsy 02/08/2019: Hyalinized intraductal papilloma, UDH 03/01/2019: Left lumpectomy: Hyalinized intraductal papilloma, no malignancy identified  05/23/2022: CT abdomen performed for abdominal discomfort: Picked up right lung base nodules 4 mm and 2 mm 07/06/2022: CT chest: 2.4 centimeter ill-defined groundglass opacity subpleural right upper lobe indeterminate, 4 mm and 2 mm nodules right lower lobe  Plan: Repeat CT in 6 months and follow-up after that. I tried to reassure her that these findings are very nonspecific and probably are not related to her breast cancer diagnosis. Return to clinic on an as-needed basis.

## 2022-07-10 NOTE — Assessment & Plan Note (Signed)
Microcytic anemia and leukopenia: Hemoglobin electrophoresis and iron studies were normal Bone marrow biopsy 02/23/2020: Slightly hypercellular bone marrow 50 to 60% cellularity with trilineage hematopoiesis, several lymphoid aggregates present but flow cytometry is negative.  No increase in blasts.  Findings are nonspecific. Cytogenetics and FISH are negative storage iron: Increased, Ring sideroblasts absent  Lab review: 07/01/2021: WBC 4.3, hemoglobin 10.7, platelets 230, ferritin 350, B12 1325, iron saturation 17%, creatinine 1.14 07/10/2022:  Counseling: Given the normal bone marrow biopsy, I attribute her leukopenia to ethnicity related.

## 2022-07-20 DIAGNOSIS — E1169 Type 2 diabetes mellitus with other specified complication: Secondary | ICD-10-CM | POA: Diagnosis not present

## 2022-07-20 DIAGNOSIS — R911 Solitary pulmonary nodule: Secondary | ICD-10-CM | POA: Diagnosis not present

## 2022-07-22 DIAGNOSIS — M1712 Unilateral primary osteoarthritis, left knee: Secondary | ICD-10-CM | POA: Diagnosis not present

## 2022-07-22 DIAGNOSIS — E1169 Type 2 diabetes mellitus with other specified complication: Secondary | ICD-10-CM | POA: Diagnosis not present

## 2022-07-22 DIAGNOSIS — Z6827 Body mass index (BMI) 27.0-27.9, adult: Secondary | ICD-10-CM | POA: Diagnosis not present

## 2022-07-22 DIAGNOSIS — R635 Abnormal weight gain: Secondary | ICD-10-CM | POA: Diagnosis not present

## 2022-07-22 DIAGNOSIS — R198 Other specified symptoms and signs involving the digestive system and abdomen: Secondary | ICD-10-CM | POA: Diagnosis not present

## 2022-07-22 DIAGNOSIS — E785 Hyperlipidemia, unspecified: Secondary | ICD-10-CM | POA: Diagnosis not present

## 2022-07-22 DIAGNOSIS — R030 Elevated blood-pressure reading, without diagnosis of hypertension: Secondary | ICD-10-CM | POA: Diagnosis not present

## 2022-07-22 DIAGNOSIS — R918 Other nonspecific abnormal finding of lung field: Secondary | ICD-10-CM | POA: Diagnosis not present

## 2022-07-22 DIAGNOSIS — H6122 Impacted cerumen, left ear: Secondary | ICD-10-CM | POA: Diagnosis not present

## 2022-07-22 DIAGNOSIS — Z87442 Personal history of urinary calculi: Secondary | ICD-10-CM | POA: Diagnosis not present

## 2022-07-22 DIAGNOSIS — E7849 Other hyperlipidemia: Secondary | ICD-10-CM | POA: Diagnosis not present

## 2022-10-02 DIAGNOSIS — I890 Lymphedema, not elsewhere classified: Secondary | ICD-10-CM | POA: Diagnosis not present

## 2022-10-02 DIAGNOSIS — C50412 Malignant neoplasm of upper-outer quadrant of left female breast: Secondary | ICD-10-CM | POA: Diagnosis not present

## 2022-10-02 DIAGNOSIS — Z800 Family history of malignant neoplasm of digestive organs: Secondary | ICD-10-CM | POA: Diagnosis not present

## 2022-10-02 DIAGNOSIS — Z170 Estrogen receptor positive status [ER+]: Secondary | ICD-10-CM | POA: Diagnosis not present

## 2022-10-02 DIAGNOSIS — N642 Atrophy of breast: Secondary | ICD-10-CM | POA: Diagnosis not present

## 2022-10-02 DIAGNOSIS — R911 Solitary pulmonary nodule: Secondary | ICD-10-CM | POA: Diagnosis not present

## 2022-10-30 DIAGNOSIS — E7849 Other hyperlipidemia: Secondary | ICD-10-CM | POA: Diagnosis not present

## 2022-10-30 DIAGNOSIS — E1169 Type 2 diabetes mellitus with other specified complication: Secondary | ICD-10-CM | POA: Diagnosis not present

## 2022-10-30 DIAGNOSIS — Z1321 Encounter for screening for nutritional disorder: Secondary | ICD-10-CM | POA: Diagnosis not present

## 2022-11-06 DIAGNOSIS — M67442 Ganglion, left hand: Secondary | ICD-10-CM | POA: Diagnosis not present

## 2022-11-06 DIAGNOSIS — E1169 Type 2 diabetes mellitus with other specified complication: Secondary | ICD-10-CM | POA: Diagnosis not present

## 2022-11-06 DIAGNOSIS — R300 Dysuria: Secondary | ICD-10-CM | POA: Diagnosis not present

## 2022-11-06 DIAGNOSIS — Z23 Encounter for immunization: Secondary | ICD-10-CM | POA: Diagnosis not present

## 2022-11-06 DIAGNOSIS — M1712 Unilateral primary osteoarthritis, left knee: Secondary | ICD-10-CM | POA: Diagnosis not present

## 2022-11-06 DIAGNOSIS — R030 Elevated blood-pressure reading, without diagnosis of hypertension: Secondary | ICD-10-CM | POA: Diagnosis not present

## 2022-11-06 DIAGNOSIS — Z6827 Body mass index (BMI) 27.0-27.9, adult: Secondary | ICD-10-CM | POA: Diagnosis not present

## 2022-11-06 DIAGNOSIS — E7849 Other hyperlipidemia: Secondary | ICD-10-CM | POA: Diagnosis not present

## 2022-11-23 ENCOUNTER — Other Ambulatory Visit: Payer: Self-pay | Admitting: Family Medicine

## 2022-11-23 DIAGNOSIS — Z1231 Encounter for screening mammogram for malignant neoplasm of breast: Secondary | ICD-10-CM

## 2022-12-29 ENCOUNTER — Ambulatory Visit: Payer: 59

## 2022-12-30 ENCOUNTER — Other Ambulatory Visit: Payer: Self-pay | Admitting: *Deleted

## 2022-12-30 ENCOUNTER — Telehealth: Payer: Self-pay | Admitting: Hematology and Oncology

## 2022-12-30 DIAGNOSIS — D72819 Decreased white blood cell count, unspecified: Secondary | ICD-10-CM

## 2022-12-30 DIAGNOSIS — D0512 Intraductal carcinoma in situ of left breast: Secondary | ICD-10-CM

## 2022-12-30 NOTE — Telephone Encounter (Signed)
Patient is aware of scheduled appointment times/dates

## 2022-12-30 NOTE — Progress Notes (Signed)
Received message from PA team stating CT Chest W/o contrast needing to be re-ordered for GI location due to insurance.  Orders placed, appt scheduled.  Pt notified and verbalized understanding.

## 2023-01-08 ENCOUNTER — Ambulatory Visit (HOSPITAL_COMMUNITY): Payer: 59

## 2023-01-13 ENCOUNTER — Ambulatory Visit
Admission: RE | Admit: 2023-01-13 | Discharge: 2023-01-13 | Disposition: A | Discharge status: Home / Self Care | Payer: 59 | Source: Ambulatory Visit | Attending: Hematology and Oncology | Admitting: Hematology and Oncology

## 2023-01-13 DIAGNOSIS — D0512 Intraductal carcinoma in situ of left breast: Secondary | ICD-10-CM

## 2023-01-13 DIAGNOSIS — J984 Other disorders of lung: Secondary | ICD-10-CM | POA: Diagnosis not present

## 2023-01-13 DIAGNOSIS — D72819 Decreased white blood cell count, unspecified: Secondary | ICD-10-CM

## 2023-01-14 DIAGNOSIS — M1990 Unspecified osteoarthritis, unspecified site: Secondary | ICD-10-CM | POA: Diagnosis not present

## 2023-01-14 DIAGNOSIS — Z809 Family history of malignant neoplasm, unspecified: Secondary | ICD-10-CM | POA: Diagnosis not present

## 2023-01-14 DIAGNOSIS — Z7982 Long term (current) use of aspirin: Secondary | ICD-10-CM | POA: Diagnosis not present

## 2023-01-14 DIAGNOSIS — E785 Hyperlipidemia, unspecified: Secondary | ICD-10-CM | POA: Diagnosis not present

## 2023-01-14 DIAGNOSIS — Z008 Encounter for other general examination: Secondary | ICD-10-CM | POA: Diagnosis not present

## 2023-01-14 DIAGNOSIS — Z791 Long term (current) use of non-steroidal anti-inflammatories (NSAID): Secondary | ICD-10-CM | POA: Diagnosis not present

## 2023-01-14 DIAGNOSIS — R32 Unspecified urinary incontinence: Secondary | ICD-10-CM | POA: Diagnosis not present

## 2023-01-14 DIAGNOSIS — I10 Essential (primary) hypertension: Secondary | ICD-10-CM | POA: Diagnosis not present

## 2023-01-14 DIAGNOSIS — Z823 Family history of stroke: Secondary | ICD-10-CM | POA: Diagnosis not present

## 2023-01-14 DIAGNOSIS — E1165 Type 2 diabetes mellitus with hyperglycemia: Secondary | ICD-10-CM | POA: Diagnosis not present

## 2023-01-14 DIAGNOSIS — Z7984 Long term (current) use of oral hypoglycemic drugs: Secondary | ICD-10-CM | POA: Diagnosis not present

## 2023-01-27 ENCOUNTER — Ambulatory Visit
Admission: RE | Admit: 2023-01-27 | Discharge: 2023-01-27 | Disposition: A | Discharge status: Home / Self Care | Payer: 59 | Source: Ambulatory Visit | Attending: Family Medicine | Admitting: Family Medicine

## 2023-01-27 DIAGNOSIS — Z1231 Encounter for screening mammogram for malignant neoplasm of breast: Secondary | ICD-10-CM

## 2023-02-01 ENCOUNTER — Other Ambulatory Visit: Payer: Self-pay | Admitting: Hematology and Oncology

## 2023-02-01 ENCOUNTER — Inpatient Hospital Stay: Payer: 59 | Attending: Hematology and Oncology | Admitting: Hematology and Oncology

## 2023-02-01 VITALS — BP 186/94 | HR 98 | Temp 98.2°F | Resp 18 | Ht 66.5 in | Wt 173.2 lb

## 2023-02-01 DIAGNOSIS — Z170 Estrogen receptor positive status [ER+]: Secondary | ICD-10-CM | POA: Diagnosis not present

## 2023-02-01 DIAGNOSIS — Z923 Personal history of irradiation: Secondary | ICD-10-CM | POA: Insufficient documentation

## 2023-02-01 DIAGNOSIS — Z7982 Long term (current) use of aspirin: Secondary | ICD-10-CM | POA: Insufficient documentation

## 2023-02-01 DIAGNOSIS — D0512 Intraductal carcinoma in situ of left breast: Secondary | ICD-10-CM | POA: Insufficient documentation

## 2023-02-01 DIAGNOSIS — R911 Solitary pulmonary nodule: Secondary | ICD-10-CM | POA: Diagnosis not present

## 2023-02-01 DIAGNOSIS — R918 Other nonspecific abnormal finding of lung field: Secondary | ICD-10-CM | POA: Diagnosis not present

## 2023-02-01 DIAGNOSIS — N644 Mastodynia: Secondary | ICD-10-CM

## 2023-02-01 DIAGNOSIS — Z79810 Long term (current) use of selective estrogen receptor modulators (SERMs): Secondary | ICD-10-CM | POA: Insufficient documentation

## 2023-02-01 DIAGNOSIS — Z7984 Long term (current) use of oral hypoglycemic drugs: Secondary | ICD-10-CM | POA: Diagnosis not present

## 2023-02-01 DIAGNOSIS — Z791 Long term (current) use of non-steroidal anti-inflammatories (NSAID): Secondary | ICD-10-CM | POA: Insufficient documentation

## 2023-02-01 DIAGNOSIS — Z79899 Other long term (current) drug therapy: Secondary | ICD-10-CM | POA: Insufficient documentation

## 2023-02-01 MED ORDER — ATORVASTATIN CALCIUM 40 MG PO TABS: 40.0000 mg | ORAL_TABLET | Freq: Every day | ORAL | Status: AC

## 2023-02-01 MED ORDER — DICLOFENAC SODIUM 75 MG PO TBEC: 75.0000 mg | DELAYED_RELEASE_TABLET | Freq: Two times a day (BID) | ORAL | Status: DC

## 2023-02-01 MED ORDER — ASPIRIN 81 MG PO TBEC: 81.0000 mg | DELAYED_RELEASE_TABLET | Freq: Every day | ORAL | Status: AC

## 2023-02-01 NOTE — Assessment & Plan Note (Signed)
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 Breast conserving surgery: 01/30/16: ADH 2. Followed by adjuvant radiation therapy 3. Followed by antiestrogen therapy with tamoxifen 5 years started 04/28/2016 completed March 2023 4. left lumpectomy (nipple discharge) 03/01/2019: Hyalinized intraductal papilloma    Breast cancer surveillance: 1.  Breast exam : Benign 2. mammogram and ultrasound 12/26/2021: Benign breast density category B 3.  breast MRI 02/06/2019: 1.3 cm area of enhancement medial aspect of left nipple areolar complex, stable postoperative seroma 5.6 cm Biopsy 02/08/2019: Hyalinized intraductal papilloma, UDH 03/01/2019: Left lumpectomy: Hyalinized intraductal papilloma, no malignancy identified   05/23/2022: CT abdomen performed for abdominal discomfort: Picked up right lung base nodules 4 mm and 2 mm 07/06/2022: CT chest: 2.4 centimeter ill-defined groundglass opacity subpleural right upper lobe indeterminate, 4 mm and 2 mm nodules right lower lobe 01/25/2023: CT chest: Unchanged groundglass opacity 2.1 cm right upper lobe, unchanged subsolid nodule right lower lobe 0.5 cm, unchanged 0.2 cm nodule left lower lobe  Patient will need a new mammogram.

## 2023-02-01 NOTE — Progress Notes (Signed)
Patient Care Team: Pcp, No as PCP - General Fields, Darleene Cleaver, MD (Inactive) (Gastroenterology) Causey, Larna Daughters, NP as Nurse Practitioner (Hematology and Oncology) Serena Croissant, MD as Consulting Physician (Hematology and Oncology) Lonie Peak, MD as Attending Physician (Radiation Oncology) Almond Lint, MD as Consulting Physician (General Surgery)  DIAGNOSIS:  Encounter Diagnoses  Name Primary?   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 Biopsy showed hyalinized intraductal papilloma, usual ductal hyperplasia, and no evidence of malignancy.     CHIEF COMPLIANT: Follow-up after recent CT scans  HISTORY OF PRESENT ILLNESS: Kerri Shah is a 62 year old with above-mentioned history of DCIS who has had lung nodules and underwent a CT chest and is here today to discuss results.  She complains of chronic cough with phlegm but otherwise without any new symptoms or concerns.  Her blood pressure has been running quite high and has a appointment to see her primary care physician.     ALLERGIES:  has no known allergies.  MEDICATIONS:  Current Outpatient Medications  Medication Sig Dispense Refill   aspirin EC 81 MG tablet Take 1 tablet (81 mg total) by mouth daily. Swallow whole.     atorvastatin (LIPITOR) 40 MG tablet Take 1 tablet  (40 mg total) by mouth daily.     diclofenac (VOLTAREN) 75 MG EC tablet Take 1 tablet (75 mg total) by mouth 2 (two) times daily.     Ascorbic Acid (VITAMIN C) 1000 MG tablet Take 1,000 mg by mouth daily.     cholecalciferol (VITAMIN D3) 25 MCG (1000 UNIT) tablet Take 1,000 Units by mouth daily.     ferrous sulfate 325 (65 FE) MG tablet Take 325 mg by mouth daily.     glimepiride (AMARYL) 2 MG tablet Take 2 mg by mouth every other day.     magnesium oxide (MAG-OX) 400 (240 Mg) MG tablet Take 400 mg by mouth daily.     metFORMIN (GLUCOPHAGE) 500 MG tablet Take 500 mg by mouth at bedtime.     Multiple Vitamins-Minerals (MULTIVITAMIN WITH MINERALS) tablet Take 1 tablet by mouth daily.     pravastatin (PRAVACHOL) 40 MG tablet Take 40 mg by mouth at bedtime.   3   zinc gluconate 50 MG tablet Take 50 mg by mouth daily.     No current facility-administered medications for this visit.    PHYSICAL EXAMINATION: ECOG PERFORMANCE STATUS: 1 - Symptomatic but completely ambulatory  Vitals:   02/01/23 1139 02/01/23 1140  BP: (!) 211/83 (!) 186/94  Pulse: 98   Resp: 18   Temp: 98.2 F (36.8 C)   SpO2: 100%    Filed Weights   02/01/23 1139  Weight: 173 lb 3.2 oz (78.6 kg)      LABORATORY DATA:  I have reviewed the data as listed    Latest Ref  Rng & Units 07/10/2022    9:57 AM 07/01/2021   11:21 AM 07/01/2020   11:21 AM  CMP  Glucose 70 - 99 mg/dL 324  401  027   BUN 8 - 23 mg/dL 19  21  12    Creatinine 0.44 - 1.00 mg/dL 2.53  6.64  4.03   Sodium 135 - 145 mmol/L 140  139  143   Potassium 3.5 - 5.1 mmol/L 3.9  3.7  3.9   Chloride 98 - 111 mmol/L 105  104  108   CO2 22 - 32 mmol/L 25  29  28    Calcium 8.9 - 10.3 mg/dL 47.4  9.3  9.5   Total Protein 6.5 - 8.1 g/dL 7.8  7.6  7.2   Total Bilirubin 0.3 - 1.2 mg/dL 0.6  0.5  0.3   Alkaline Phos 38 - 126 U/L 64  41  50   AST 15 - 41 U/L 21  19  17    ALT 0 - 44 U/L 26  17  13      Lab Results  Component Value Date   WBC 3.3 (L) 07/10/2022    HGB 12.1 07/10/2022   HCT 37.8 07/10/2022   MCV 74.1 (L) 07/10/2022   PLT 248 07/10/2022   NEUTROABS 1.9 07/10/2022    ASSESSMENT & PLAN:  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 Breast conserving surgery: 01/30/16: ADH 2. Followed by adjuvant radiation therapy 3. Followed by antiestrogen therapy with tamoxifen 5 years started 04/28/2016 completed March 2023 4. left lumpectomy (nipple discharge) 03/01/2019: Hyalinized intraductal papilloma    Breast cancer surveillance: 1.  Breast exam : Benign 2. mammogram and ultrasound 12/26/2021: Benign breast density category B 3.  breast MRI 02/06/2019: 1.3 cm area of enhancement medial aspect of left nipple areolar complex, stable postoperative seroma 5.6 cm Biopsy 02/08/2019: Hyalinized intraductal papilloma, UDH 03/01/2019: Left lumpectomy: Hyalinized intraductal papilloma, no malignancy identified   05/23/2022: CT abdomen performed for abdominal discomfort: Picked up right lung base nodules 4 mm and 2 mm 07/06/2022: CT chest: 2.4 centimeter ill-defined groundglass opacity subpleural right upper lobe indeterminate, 4 mm and 2 mm nodules right lower lobe 01/25/2023: CT chest: Unchanged groundglass opacity 2.1 cm right upper lobe, unchanged subsolid nodule right lower lobe 0.5 cm, unchanged 0.2 cm nodule left lower lobe  Patient will need a new mammogram.  I sent an order for a diagnostic mammogram.  We plan to obtain a CT chest in 1 year and follow-up a week later.     Orders Placed This Encounter  Procedures   MM DIAG BREAST TOMO BILATERAL    Standing Status:   Future    Expected Date:   03/04/2023    Expiration Date:   02/01/2024    Reason for Exam (SYMPTOM  OR DIAGNOSIS REQUIRED):   annual mammograms    Preferred imaging location?:   [facility]    Release to patient:   Immediate   CT Chest W Contrast     Standing Status:   Future    Expected Date:   01/12/2024    Expiration Date:   02/01/2024    If indicated for the ordered procedure, I authorize the administration of contrast media per Radiology protocol:   Yes    Does the patient have a contrast media/X-ray dye allergy?:   No    Preferred imaging location?:   [facility]    Release to patient:   Immediate [1]   The patient has a good understanding of the overall plan. she agrees with it. she will call with any problems that may develop before the next visit here. Total time spent: 30 mins including face to face time and time spent for planning, charting and co-ordination of care   Tamsen Meek, MD 02/01/23

## 2023-02-02 ENCOUNTER — Ambulatory Visit: Payer: 59 | Admitting: Hematology and Oncology

## 2023-02-08 DIAGNOSIS — E7849 Other hyperlipidemia: Secondary | ICD-10-CM | POA: Diagnosis not present

## 2023-02-08 DIAGNOSIS — R918 Other nonspecific abnormal finding of lung field: Secondary | ICD-10-CM | POA: Diagnosis not present

## 2023-02-08 DIAGNOSIS — Z6828 Body mass index (BMI) 28.0-28.9, adult: Secondary | ICD-10-CM | POA: Diagnosis not present

## 2023-02-08 DIAGNOSIS — E1169 Type 2 diabetes mellitus with other specified complication: Secondary | ICD-10-CM | POA: Diagnosis not present

## 2023-02-08 DIAGNOSIS — I10 Essential (primary) hypertension: Secondary | ICD-10-CM | POA: Diagnosis not present

## 2023-02-08 DIAGNOSIS — Z853 Personal history of malignant neoplasm of breast: Secondary | ICD-10-CM | POA: Diagnosis not present

## 2023-02-22 ENCOUNTER — Other Ambulatory Visit: Payer: Self-pay | Admitting: Hematology and Oncology

## 2023-02-22 DIAGNOSIS — N644 Mastodynia: Secondary | ICD-10-CM

## 2023-02-22 DIAGNOSIS — R918 Other nonspecific abnormal finding of lung field: Secondary | ICD-10-CM

## 2023-02-22 DIAGNOSIS — D0512 Intraductal carcinoma in situ of left breast: Secondary | ICD-10-CM

## 2023-02-24 ENCOUNTER — Ambulatory Visit
Admission: RE | Admit: 2023-02-24 | Discharge: 2023-02-24 | Disposition: A | Discharge status: Home / Self Care | Payer: 59 | Source: Ambulatory Visit | Attending: Hematology and Oncology | Admitting: Hematology and Oncology

## 2023-02-24 ENCOUNTER — Other Ambulatory Visit: Payer: 59

## 2023-02-24 DIAGNOSIS — D0512 Intraductal carcinoma in situ of left breast: Secondary | ICD-10-CM

## 2023-02-24 DIAGNOSIS — N644 Mastodynia: Secondary | ICD-10-CM

## 2023-02-24 DIAGNOSIS — Z853 Personal history of malignant neoplasm of breast: Secondary | ICD-10-CM | POA: Diagnosis not present

## 2023-05-10 DIAGNOSIS — Z853 Personal history of malignant neoplasm of breast: Secondary | ICD-10-CM | POA: Diagnosis not present

## 2023-05-10 DIAGNOSIS — E1169 Type 2 diabetes mellitus with other specified complication: Secondary | ICD-10-CM | POA: Diagnosis not present

## 2023-05-10 DIAGNOSIS — I10 Essential (primary) hypertension: Secondary | ICD-10-CM | POA: Diagnosis not present

## 2023-05-10 DIAGNOSIS — R918 Other nonspecific abnormal finding of lung field: Secondary | ICD-10-CM | POA: Diagnosis not present

## 2023-05-10 DIAGNOSIS — Z6826 Body mass index (BMI) 26.0-26.9, adult: Secondary | ICD-10-CM | POA: Diagnosis not present

## 2023-06-04 DIAGNOSIS — E119 Type 2 diabetes mellitus without complications: Secondary | ICD-10-CM | POA: Diagnosis not present

## 2023-07-27 DIAGNOSIS — E1169 Type 2 diabetes mellitus with other specified complication: Secondary | ICD-10-CM | POA: Diagnosis not present

## 2023-08-21 DIAGNOSIS — S2096XA Insect bite (nonvenomous) of unspecified parts of thorax, initial encounter: Secondary | ICD-10-CM | POA: Diagnosis not present

## 2023-08-21 DIAGNOSIS — Z6825 Body mass index (BMI) 25.0-25.9, adult: Secondary | ICD-10-CM | POA: Diagnosis not present

## 2023-10-27 DIAGNOSIS — E7849 Other hyperlipidemia: Secondary | ICD-10-CM | POA: Diagnosis not present

## 2023-10-27 DIAGNOSIS — Z130 Encounter for screening for diseases of the blood and blood-forming organs and certain disorders involving the immune mechanism: Secondary | ICD-10-CM | POA: Diagnosis not present

## 2023-10-27 DIAGNOSIS — Z1329 Encounter for screening for other suspected endocrine disorder: Secondary | ICD-10-CM | POA: Diagnosis not present

## 2023-10-27 DIAGNOSIS — E1169 Type 2 diabetes mellitus with other specified complication: Secondary | ICD-10-CM | POA: Diagnosis not present

## 2023-11-03 DIAGNOSIS — Z0001 Encounter for general adult medical examination with abnormal findings: Secondary | ICD-10-CM | POA: Diagnosis not present

## 2023-11-03 DIAGNOSIS — R918 Other nonspecific abnormal finding of lung field: Secondary | ICD-10-CM | POA: Diagnosis not present

## 2023-11-03 DIAGNOSIS — I498 Other specified cardiac arrhythmias: Secondary | ICD-10-CM | POA: Diagnosis not present

## 2023-11-03 DIAGNOSIS — E1169 Type 2 diabetes mellitus with other specified complication: Secondary | ICD-10-CM | POA: Diagnosis not present

## 2023-11-03 DIAGNOSIS — I10 Essential (primary) hypertension: Secondary | ICD-10-CM | POA: Diagnosis not present

## 2023-11-03 DIAGNOSIS — Z6825 Body mass index (BMI) 25.0-25.9, adult: Secondary | ICD-10-CM | POA: Diagnosis not present

## 2023-12-15 ENCOUNTER — Other Ambulatory Visit: Payer: Self-pay | Admitting: Hematology and Oncology

## 2023-12-15 DIAGNOSIS — Z1231 Encounter for screening mammogram for malignant neoplasm of breast: Secondary | ICD-10-CM

## 2024-01-10 ENCOUNTER — Telehealth: Payer: Self-pay | Admitting: Cardiology

## 2024-01-10 ENCOUNTER — Ambulatory Visit: Attending: Cardiology | Admitting: Cardiology

## 2024-01-10 ENCOUNTER — Encounter: Payer: Self-pay | Admitting: Cardiology

## 2024-01-10 VITALS — BP 136/80 | HR 80 | Ht 66.5 in | Wt 170.2 lb

## 2024-01-10 DIAGNOSIS — R002 Palpitations: Secondary | ICD-10-CM

## 2024-01-10 NOTE — Telephone Encounter (Signed)
 Checking percert on the following   14 day zio - palps

## 2024-01-10 NOTE — Patient Instructions (Signed)
 Medication Instructions:   Continue all current medications.   Labwork:  none  Testing/Procedures:  Your physician has recommended that you wear a 14 day event monitor. Event monitors are medical devices that record the heart's electrical activity. Doctors most often us  these monitors to diagnose arrhythmias. Arrhythmias are problems with the speed or rhythm of the heartbeat. The monitor is a small, portable device. You can wear one while you do your normal daily activities. This is usually used to diagnose what is causing palpitations/syncope (passing out).   Follow-Up:  8 weeks    Any Other Special Instructions Will Be Listed Below (If Applicable).   If you need a refill on your cardiac medications before your next appointment, please call your pharmacy.

## 2024-01-10 NOTE — Progress Notes (Signed)
 Clinical Summary Ms. Ranney is a 63 y.o.female seen today as a new consult, referred by Dr Carletta for the following medical problems.  1.Palpitations - started about 6 months - feeling of heart skipping. No specific trigger. Lasts a few seconds.  - varies in frequency, at one point daily. Now occurring few times a week - rare coffee, rare sodas, rare tea, no energy drinks, no EtOH.  - EKG today shows NSR     Past Medical History:  Diagnosis Date   Breast cancer (HCC) 2017   left breast lumpectomy   Breast discharge    lt bloody discharge x's 3 weeks    Breast mass    lt breast mass x's 3 weeks    Complication of anesthesia    DM (diabetes mellitus) (HCC)    Fatty liver    GERD (gastroesophageal reflux disease)    History of kidney stones    Hypercholesteremia    Hypertension    Osteoarthritis    bilateral knees and Left shoulder   Personal history of radiation therapy 04/2016   radiation    PONV (postoperative nausea and vomiting)    Vitamin D  deficiency      No Known Allergies   Current Outpatient Medications  Medication Sig Dispense Refill   Ascorbic Acid (VITAMIN C) 1000 MG tablet Take 1,000 mg by mouth daily.     aspirin  EC 81 MG tablet Take 1 tablet (81 mg total) by mouth daily. Swallow whole.     atorvastatin  (LIPITOR) 40 MG tablet Take 1 tablet (40 mg total) by mouth daily.     cholecalciferol (VITAMIN D3) 25 MCG (1000 UNIT) tablet Take 1,000 Units by mouth daily.     diclofenac  (VOLTAREN ) 75 MG EC tablet Take 1 tablet (75 mg total) by mouth 2 (two) times daily.     ferrous sulfate 325 (65 FE) MG tablet Take 325 mg by mouth daily.     glimepiride (AMARYL) 2 MG tablet Take 2 mg by mouth every other day.     magnesium oxide (MAG-OX) 400 (240 Mg) MG tablet Take 400 mg by mouth daily.     metFORMIN (GLUCOPHAGE) 500 MG tablet Take 500 mg by mouth at bedtime.     Multiple Vitamins-Minerals (MULTIVITAMIN WITH MINERALS) tablet Take 1 tablet by mouth  daily.     pravastatin (PRAVACHOL) 40 MG tablet Take 40 mg by mouth at bedtime.   3   zinc gluconate 50 MG tablet Take 50 mg by mouth daily.     No current facility-administered medications for this visit.     Past Surgical History:  Procedure Laterality Date   ABDOMINAL HYSTERECTOMY     BREAST LUMPECTOMY Left 2018   BREAST LUMPECTOMY Left 03/01/2019   BREAST LUMPECTOMY WITH RADIOACTIVE SEED LOCALIZATION Left 01/30/2016   Procedure: BREAST LUMPECTOMY WITH RADIOACTIVE SEED LOCALIZATION;  Surgeon: Jina Nephew, MD;  Location: [facility];  Service: General;  Laterality: Left;   CHOLECYSTECTOMY N/A 07/30/2017   Procedure: LAPAROSCOPIC CHOLECYSTECTOMY;  Surgeon: Kallie Manuelita BROCKS, MD;  Location: [facility];  Service: General;  Laterality: N/A;   COLONOSCOPY  12/23/10   internal hemorrhoids/polyps in the recto-sigmoid colon, hyperplastic, TCS IN 5 years   COLONOSCOPY N/A 06/29/2016   Dr. harvey: Nonthrombosed external hemorrhoids, 2 2 to 3 mm polyps removed, mild diverticulosis, internal hemorrhoids.  Path revealed tubular adenoma.  Next colonoscopy 5 years based on family history as well.   COLONOSCOPY WITH PROPOFOL  N/A 09/29/2021  Procedure: COLONOSCOPY WITH PROPOFOL ;  Surgeon: Cindie Carlin POUR, DO;  Location: [facility];  Service: Endoscopy;  Laterality: N/A;  12:15pm, asa 2   ESOPHAGOGASTRODUODENOSCOPY N/A 02/07/2018   Dr. harvey: Mild gastritis, pyloric stenosis status post dilation.  Placement of Givens capsule at the same time.   GIVENS CAPSULE STUDY N/A 02/07/2018   Dr. harvey: Normal   PARTIAL HYSTERECTOMY  2010   uterine fibroids   RADIOACTIVE SEED GUIDED EXCISIONAL BREAST BIOPSY Left 03/01/2019   Procedure: RADIOACTIVE SEED GUIDED EXCISIONAL LEFT BREAST BIOPSY;  Surgeon: Aron Shoulders, MD;  Location: [facility];  Service: General;  Laterality: Left;   TUBAL LIGATION     UPPER GASTROINTESTINAL ENDOSCOPY  Nov 2012   mild gastritis, patent esophageal  ring s/p forceps dilation, chronic gastritis on path     No Known Allergies    Family History  Problem Relation Age of Onset   Colon cancer Mother 38   Colon cancer Brother 4   Diabetes Brother      Social History Ms. Landrus reports that she has never smoked. She has never used smokeless tobacco. Ms. Klunder reports no history of alcohol use.    Physical Examination Today's Vitals   01/10/24 1304  BP: 136/80  Pulse: 80  SpO2: 98%  Weight: 170 lb 3.2 oz (77.2 kg)  Height: 5' 6.5 (1.689 m)   Body mass index is 27.06 kg/m.  Gen: resting comfortably, no acute distress HEENT: no scleral icterus, pupils equal round and reactive, no palptable cervical adenopathy,  CV: RRR, no m/rg, no jvd Resp: Clear to auscultation bilaterally GI: abdomen is soft, non-tender, non-distended, normal bowel sounds, no hepatosplenomegaly MSK: extremities are warm, no edema.  Skin: warm, no rash Neuro:  no focal deficits Psych: appropriate affect     Assessment and Plan  1.Palpitations - will plan for 2 week zio patch to further evaluate - EKG today shows NSR        Dorn PHEBE Ross, M.D.

## 2024-01-12 ENCOUNTER — Other Ambulatory Visit: Payer: Self-pay | Admitting: Cardiology

## 2024-01-12 ENCOUNTER — Ambulatory Visit (HOSPITAL_COMMUNITY): Admission: RE | Admit: 2024-01-12 | Discharge: 2024-01-12 | Attending: Hematology and Oncology

## 2024-01-12 ENCOUNTER — Ambulatory Visit

## 2024-01-12 DIAGNOSIS — R918 Other nonspecific abnormal finding of lung field: Secondary | ICD-10-CM | POA: Insufficient documentation

## 2024-01-12 DIAGNOSIS — R002 Palpitations: Secondary | ICD-10-CM | POA: Diagnosis present

## 2024-01-12 MED ORDER — IOHEXOL 300 MG/ML  SOLN
75.0000 mL | Freq: Once | INTRAMUSCULAR | Status: AC | PRN
  Administered 2024-01-12: 75 mL via INTRAVENOUS

## 2024-01-19 ENCOUNTER — Inpatient Hospital Stay: Payer: 59 | Attending: Hematology and Oncology | Admitting: Hematology and Oncology

## 2024-01-19 VITALS — BP 168/90 | HR 97 | Temp 97.6°F | Resp 20 | Wt 169.1 lb

## 2024-01-19 DIAGNOSIS — R918 Other nonspecific abnormal finding of lung field: Secondary | ICD-10-CM | POA: Diagnosis not present

## 2024-01-19 DIAGNOSIS — Z923 Personal history of irradiation: Secondary | ICD-10-CM | POA: Insufficient documentation

## 2024-01-19 DIAGNOSIS — Z7984 Long term (current) use of oral hypoglycemic drugs: Secondary | ICD-10-CM | POA: Insufficient documentation

## 2024-01-19 DIAGNOSIS — Z7982 Long term (current) use of aspirin: Secondary | ICD-10-CM | POA: Insufficient documentation

## 2024-01-19 DIAGNOSIS — D0512 Intraductal carcinoma in situ of left breast: Secondary | ICD-10-CM | POA: Diagnosis not present

## 2024-01-19 DIAGNOSIS — Z170 Estrogen receptor positive status [ER+]: Secondary | ICD-10-CM | POA: Diagnosis not present

## 2024-01-19 DIAGNOSIS — R002 Palpitations: Secondary | ICD-10-CM | POA: Diagnosis not present

## 2024-01-19 DIAGNOSIS — Z79810 Long term (current) use of selective estrogen receptor modulators (SERMs): Secondary | ICD-10-CM | POA: Insufficient documentation

## 2024-01-19 DIAGNOSIS — Z79899 Other long term (current) drug therapy: Secondary | ICD-10-CM | POA: Diagnosis not present

## 2024-01-19 NOTE — Assessment & Plan Note (Signed)
 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 Breast conserving surgery: 01/30/16: ADH 2. Followed by adjuvant radiation therapy 3. Followed by antiestrogen therapy with tamoxifen  5 years started 04/28/2016 completed March 2023 4. left lumpectomy (nipple discharge) 03/01/2019: Hyalinized intraductal papilloma    Breast cancer surveillance: 1.  Breast exam : Benign 2. mammogram and ultrasound 02/24/2023: Benign breast density category B 3.  breast MRI 02/06/2019: 1.3 cm area of enhancement medial aspect of left nipple areolar complex, stable postoperative seroma 5.6 cm Biopsy 02/08/2019: Hyalinized intraductal papilloma, UDH 03/01/2019: Left lumpectomy: Hyalinized intraductal papilloma, no malignancy identified   05/23/2022: CT abdomen performed for abdominal discomfort: Picked up right lung base nodules 4 mm and 2 mm 07/06/2022: CT chest: 2.4 centimeter ill-defined groundglass opacity subpleural right upper lobe indeterminate, 4 mm and 2 mm nodules right lower lobe 01/25/2023: CT chest: Unchanged groundglass opacity 2.1 cm right upper lobe, unchanged subsolid nodule right lower lobe 0.5 cm, unchanged 0.2 cm nodule left lower lobe 01/17/2024: CT chest: Unchanged lung nodules (2.1 cm, 4 mm Kappa to millimeters, 5 mm, 3 mm)    We plan to obtain a CT chest in 1 year and follow-up a week later.

## 2024-01-19 NOTE — Progress Notes (Signed)
 Patient Care Team: Lari Elspeth BRAVO, MD as PCP - General (Family Medicine) Alvan, Dorn FALCON, MD as PCP - Cardiology (Cardiology) Harvey Margo CROME, MD (Inactive) (Gastroenterology) Crawford, Morna Pickle, NP as Nurse Practitioner (Hematology and Oncology) Odean Potts, MD as Consulting Physician (Hematology and Oncology) Izell Domino, MD as Attending Physician (Radiation Oncology) Aron Shoulders, MD as Consulting Physician (General Surgery)  DIAGNOSIS:  Encounter Diagnosis  Name Primary?   Ductal carcinoma in situ (DCIS) of left breast Yes    SUMMARY OF ONCOLOGIC HISTORY: Oncology History  Ductal carcinoma in situ (DCIS) of left breast  12/31/2015 Initial Diagnosis   Left breast [name] biopsy: DCIS with calcifications ER 90%, PR -50%, high-grade   01/30/2016 Surgery   Left Lumpectomy: No residual DCIS, fibrocystic changes with UDH   02/27/2016 - 04/16/2016 Radiation Therapy   Adjuvant radiation therapy in [facility]   04/28/2016 -  Anti-estrogen oral therapy   Tamoxifen  20 mg daily 5 years   01/19/2019 Breast MRI   Breast MRI showed thickening and focal enhancement in the upper-outer left areola, a 1.3cm enhancement 3-100mm from the lumpectomy site. Biopsy showed hyalinized intraductal papilloma, usual ductal hyperplasia, and no evidence of malignancy.     CHIEF COMPLIANT:   HISTORY OF PRESENT ILLNESS:   History of Present Illness Kerri Shah is a 63 year old female with stable multiple pulmonary nodules and prior high-grade ductal carcinoma in situ of the left breast who presents for routine oncology follow-up and evaluation of new cardiac symptoms.  She has multiple pulmonary nodules, including a 2.1 cm nodule and several smaller nodules, all stable on annual CT scans since 2024 with no new nodules or growth. She has not yet viewed the most recent CT report due to technical issues but understands that the nodules remain stable.  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 She is now off anti-estrogen therapy and has only occasional brief sharp breast pain without new or progressive breast symptoms. She is scheduled for a surveillance mammogram next month.  She has intermittent palpitations and heart fluttering at rest and with activity, which are infrequent. She denies chest pain or mood symptoms. Her family physician recommended further evaluation of these cardiac symptoms.  Feb 01, 2023: Follow-up after recent CT scans for history of left breast DCIS and multiple lung nodules. Imaging on January 25, 2023, showed stable lung nodules and groundglass opacity in the right upper lobe. Patient reported chronic cough with phlegm and elevated blood pressure; diagnostic mammogram scheduled for January 2025 and follow-up CT chest planned for December 2025.     ALLERGIES:  has no known allergies.  MEDICATIONS:  Current Outpatient Medications  Medication Sig Dispense Refill   Ascorbic Acid (VITAMIN C) 1000 MG tablet Take 1,000 mg by mouth daily.     aspirin  EC 81 MG tablet Take 1 tablet (81 mg total) by mouth daily. Swallow whole.     atorvastatin  (LIPITOR) 40 MG tablet Take 1 tablet (40 mg total) by mouth daily.     cholecalciferol (VITAMIN D3) 25 MCG (1000 UNIT) tablet Take 1,000 Units by mouth daily.     ferrous sulfate 325 (65 FE) MG tablet Take 325 mg by mouth daily.     lisinopril (ZESTRIL) 20 MG tablet Take 20 mg by mouth daily.     magnesium oxide (MAG-OX) 400 (240 Mg) MG tablet Take  400 mg by mouth daily.     metFORMIN (GLUCOPHAGE) 500 MG tablet Take 500 mg by mouth at bedtime.     Multiple Vitamins-Minerals (MULTIVITAMIN WITH MINERALS) tablet Take 1 tablet by mouth daily.     zinc gluconate 50 MG tablet Take 50 mg by mouth daily.     No current facility-administered medications for this visit.     PHYSICAL EXAMINATION: ECOG PERFORMANCE STATUS: 1 - Symptomatic but completely ambulatory  Vitals:   01/19/24 0855  BP: (!) 168/90  Pulse: 97  Resp: 20  Temp: 97.6 F (36.4 C)  SpO2: 98%   Filed Weights   01/19/24 0855  Weight: 169 lb 1.6 oz (76.7 kg)      LABORATORY DATA:  I have reviewed the data as listed    Latest Ref Rng & Units 07/10/2022    9:57 AM 07/01/2021   11:21 AM 07/01/2020   11:21 AM  CMP  Glucose 70 - 99 mg/dL 759  800  898   BUN 8 - 23 mg/dL 19  21  12    Creatinine 0.44 - 1.00 mg/dL 8.96  8.85  8.96   Sodium 135 - 145 mmol/L 140  139  143   Potassium 3.5 - 5.1 mmol/L 3.9  3.7  3.9   Chloride 98 - 111 mmol/L 105  104  108   CO2 22 - 32 mmol/L 25  29  28    Calcium  8.9 - 10.3 mg/dL 89.9  9.3  9.5   Total Protein 6.5 - 8.1 g/dL 7.8  7.6  7.2   Total Bilirubin 0.3 - 1.2 mg/dL 0.6  0.5  0.3   Alkaline Phos 38 - 126 U/L 64  41  50   AST 15 - 41 U/L 21  19  17    ALT 0 - 44 U/L 26  17  13      Lab Results  Component Value Date   WBC 3.3 (L) 07/10/2022   HGB 12.1 07/10/2022   HCT 37.8 07/10/2022   MCV 74.1 (L) 07/10/2022   PLT 248 07/10/2022   NEUTROABS 1.9 07/10/2022    ASSESSMENT & PLAN:  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 Breast conserving surgery: 01/30/16: ADH 2. Followed by adjuvant radiation therapy 3. Followed by antiestrogen therapy with tamoxifen  5 years started 04/28/2016 completed March 2023 4. left lumpectomy (nipple discharge) 03/01/2019: Hyalinized intraductal papilloma    Breast cancer surveillance: 1.  Breast exam : Benign 2. mammogram and ultrasound 02/24/2023: Benign breast density category B 3.  breast MRI 02/06/2019: 1.3 cm area of enhancement medial aspect of left nipple areolar complex, stable postoperative seroma 5.6 cm Biopsy 02/08/2019: Hyalinized intraductal papilloma, UDH 03/01/2019: Left  lumpectomy: Hyalinized intraductal papilloma, no malignancy identified   05/23/2022: CT abdomen performed for abdominal discomfort: Picked up right lung base nodules 4 mm and 2 mm 07/06/2022: CT chest: 2.4 centimeter ill-defined groundglass opacity subpleural right upper lobe indeterminate, 4 mm and 2 mm nodules right lower lobe 01/25/2023: CT chest: Unchanged groundglass opacity 2.1 cm right upper lobe, unchanged subsolid nodule right lower lobe 0.5 cm, unchanged 0.2 cm nodule left lower lobe 01/17/2024: CT chest: Unchanged lung nodules (2.1 cm, 4 mm Kappa to millimeters, 5 mm, 3 mm)    We plan to obtain a CT chest in 1 year and follow-up a week later.     No orders of the defined types  were placed in this encounter.  The patient has a good understanding of the overall plan. she agrees with it. she will call with any problems that may develop before the next visit here.  I personally spent a total of 30 minutes in the care of the patient today including preparing to see the patient, getting/reviewing separately obtained history, performing a medically appropriate exam/evaluation, counseling and educating, placing orders, referring and communicating with other health care professionals, documenting clinical information in the EHR, independently interpreting results, communicating results, and coordinating care.   Viinay K Lorece Keach, MD 01/19/24

## 2024-01-25 ENCOUNTER — Ambulatory Visit: Admitting: Internal Medicine

## 2024-02-25 ENCOUNTER — Ambulatory Visit
Admission: RE | Admit: 2024-02-25 | Discharge: 2024-02-25 | Disposition: A | Discharge status: Home / Self Care | Source: Ambulatory Visit | Attending: Hematology and Oncology | Admitting: Hematology and Oncology

## 2024-02-25 DIAGNOSIS — Z1231 Encounter for screening mammogram for malignant neoplasm of breast: Secondary | ICD-10-CM

## 2024-03-13 DIAGNOSIS — R002 Palpitations: Secondary | ICD-10-CM | POA: Diagnosis not present

## 2024-03-16 ENCOUNTER — Ambulatory Visit: Payer: Self-pay | Admitting: Cardiology

## 2024-03-23 ENCOUNTER — Ambulatory Visit: Admitting: Nurse Practitioner

## 2025-01-18 ENCOUNTER — Inpatient Hospital Stay: Admitting: Hematology and Oncology
